# Patient Record
Sex: Male | Born: 1970 | Race: White | Hispanic: No | Marital: Married | Smoking: Never smoker
Health system: Southern US, Community
[De-identification: ages and names within clinical notes are randomized; demographics above are authoritative.]

## PROBLEM LIST (undated history)

## (undated) DIAGNOSIS — E785 Hyperlipidemia, unspecified: Secondary | ICD-10-CM

## (undated) DIAGNOSIS — Z8774 Personal history of (corrected) congenital malformations of heart and circulatory system: Secondary | ICD-10-CM

## (undated) DIAGNOSIS — K219 Gastro-esophageal reflux disease without esophagitis: Secondary | ICD-10-CM

## (undated) DIAGNOSIS — C73 Malignant neoplasm of thyroid gland: Secondary | ICD-10-CM

## (undated) DIAGNOSIS — Z8709 Personal history of other diseases of the respiratory system: Secondary | ICD-10-CM

## (undated) DIAGNOSIS — M169 Osteoarthritis of hip, unspecified: Secondary | ICD-10-CM

## (undated) DIAGNOSIS — G459 Transient cerebral ischemic attack, unspecified: Secondary | ICD-10-CM

## (undated) DIAGNOSIS — Z7901 Long term (current) use of anticoagulants: Secondary | ICD-10-CM

## (undated) DIAGNOSIS — I712 Thoracic aortic aneurysm, without rupture: Secondary | ICD-10-CM

## (undated) DIAGNOSIS — R42 Dizziness and giddiness: Secondary | ICD-10-CM

## (undated) DIAGNOSIS — I350 Nonrheumatic aortic (valve) stenosis: Secondary | ICD-10-CM

## (undated) DIAGNOSIS — Z9109 Other allergy status, other than to drugs and biological substances: Secondary | ICD-10-CM

## (undated) DIAGNOSIS — R911 Solitary pulmonary nodule: Secondary | ICD-10-CM

## (undated) DIAGNOSIS — E039 Hypothyroidism, unspecified: Secondary | ICD-10-CM

## (undated) DIAGNOSIS — E890 Postprocedural hypothyroidism: Secondary | ICD-10-CM

## (undated) DIAGNOSIS — Z9089 Acquired absence of other organs: Secondary | ICD-10-CM

## (undated) DIAGNOSIS — Z952 Presence of prosthetic heart valve: Secondary | ICD-10-CM

## (undated) DIAGNOSIS — T8859XA Other complications of anesthesia, initial encounter: Secondary | ICD-10-CM

## (undated) DIAGNOSIS — R112 Nausea with vomiting, unspecified: Secondary | ICD-10-CM

## (undated) DIAGNOSIS — C801 Malignant (primary) neoplasm, unspecified: Secondary | ICD-10-CM

## (undated) DIAGNOSIS — I10 Essential (primary) hypertension: Secondary | ICD-10-CM

## (undated) DIAGNOSIS — Z9889 Other specified postprocedural states: Secondary | ICD-10-CM

## (undated) DIAGNOSIS — T4145XA Adverse effect of unspecified anesthetic, initial encounter: Secondary | ICD-10-CM

## (undated) HISTORY — DX: Osteoarthritis of hip, unspecified: M16.9

## (undated) HISTORY — PX: CARDIAC VALVE SURGERY: SHX40

## (undated) HISTORY — DX: Essential (primary) hypertension: I10

## (undated) HISTORY — DX: Presence of prosthetic heart valve: Z95.2

## (undated) HISTORY — PX: THYROIDECTOMY: SHX17

## (undated) HISTORY — DX: Personal history of (corrected) congenital malformations of heart and circulatory system: Z87.74

## (undated) HISTORY — DX: Thoracic aortic aneurysm, without rupture: I71.2

## (undated) HISTORY — DX: Long term (current) use of anticoagulants: Z79.01

## (undated) HISTORY — DX: Malignant (primary) neoplasm, unspecified: C80.1

## (undated) HISTORY — DX: Solitary pulmonary nodule: R91.1

## (undated) HISTORY — DX: Malignant neoplasm of thyroid gland: C73

## (undated) HISTORY — DX: Nonrheumatic aortic (valve) stenosis: I35.0

## (undated) HISTORY — DX: Hyperlipidemia, unspecified: E78.5

---

## 2011-03-17 DIAGNOSIS — M169 Osteoarthritis of hip, unspecified: Secondary | ICD-10-CM

## 2011-03-17 HISTORY — DX: Osteoarthritis of hip, unspecified: M16.9

## 2014-12-03 DIAGNOSIS — Z952 Presence of prosthetic heart valve: Secondary | ICD-10-CM | POA: Insufficient documentation

## 2014-12-03 DIAGNOSIS — Z7901 Long term (current) use of anticoagulants: Secondary | ICD-10-CM | POA: Insufficient documentation

## 2014-12-03 DIAGNOSIS — I350 Nonrheumatic aortic (valve) stenosis: Secondary | ICD-10-CM | POA: Insufficient documentation

## 2014-12-03 HISTORY — DX: Presence of prosthetic heart valve: Z95.2

## 2014-12-03 HISTORY — DX: Long term (current) use of anticoagulants: Z79.01

## 2014-12-19 DIAGNOSIS — R1310 Dysphagia, unspecified: Secondary | ICD-10-CM | POA: Insufficient documentation

## 2014-12-19 DIAGNOSIS — E079 Disorder of thyroid, unspecified: Secondary | ICD-10-CM | POA: Insufficient documentation

## 2014-12-19 DIAGNOSIS — I10 Essential (primary) hypertension: Secondary | ICD-10-CM | POA: Insufficient documentation

## 2014-12-19 HISTORY — DX: Essential (primary) hypertension: I10

## 2014-12-25 DIAGNOSIS — C73 Malignant neoplasm of thyroid gland: Secondary | ICD-10-CM | POA: Insufficient documentation

## 2014-12-25 HISTORY — DX: Malignant neoplasm of thyroid gland: C73

## 2015-01-03 DIAGNOSIS — E785 Hyperlipidemia, unspecified: Secondary | ICD-10-CM | POA: Insufficient documentation

## 2015-01-29 ENCOUNTER — Other Ambulatory Visit (HOSPITAL_COMMUNITY): Payer: Self-pay | Admitting: Endocrinology

## 2015-01-29 DIAGNOSIS — C73 Malignant neoplasm of thyroid gland: Secondary | ICD-10-CM

## 2015-02-11 ENCOUNTER — Ambulatory Visit (HOSPITAL_COMMUNITY)
Admission: RE | Admit: 2015-02-11 | Discharge: 2015-02-11 | Disposition: A | Discharge status: Home / Self Care | Payer: PRIVATE HEALTH INSURANCE | Source: Ambulatory Visit | Attending: Endocrinology | Admitting: Endocrinology

## 2015-02-11 DIAGNOSIS — C73 Malignant neoplasm of thyroid gland: Secondary | ICD-10-CM | POA: Diagnosis not present

## 2015-02-11 MED ORDER — THYROTROPIN ALFA 1.1 MG IM SOLR
0.9000 mg | INTRAMUSCULAR | Status: AC
  Administered 2015-02-11: 0.9 mg via INTRAMUSCULAR

## 2015-02-12 ENCOUNTER — Encounter (HOSPITAL_COMMUNITY)
Admission: RE | Admit: 2015-02-12 | Discharge: 2015-02-12 | Disposition: A | Discharge status: Home / Self Care | Payer: PRIVATE HEALTH INSURANCE | Source: Ambulatory Visit | Attending: Endocrinology | Admitting: Endocrinology

## 2015-02-12 DIAGNOSIS — C73 Malignant neoplasm of thyroid gland: Secondary | ICD-10-CM | POA: Diagnosis not present

## 2015-02-12 MED ORDER — THYROTROPIN ALFA 1.1 MG IM SOLR
0.9000 mg | INTRAMUSCULAR | Status: AC
  Administered 2015-02-12: 0.9 mg via INTRAMUSCULAR

## 2015-02-12 MED ORDER — STERILE WATER FOR INJECTION IJ SOLN
INTRAMUSCULAR | Status: AC
  Filled 2015-02-12: qty 10

## 2015-02-13 ENCOUNTER — Encounter (HOSPITAL_COMMUNITY)
Admission: RE | Admit: 2015-02-13 | Discharge: 2015-02-13 | Disposition: A | Discharge status: Home / Self Care | Payer: PRIVATE HEALTH INSURANCE | Source: Ambulatory Visit | Attending: Endocrinology | Admitting: Endocrinology

## 2015-02-13 DIAGNOSIS — C73 Malignant neoplasm of thyroid gland: Secondary | ICD-10-CM | POA: Diagnosis not present

## 2015-02-13 MED ORDER — SODIUM IODIDE I 131 CAPSULE
132.7000 | Freq: Once | INTRAVENOUS | Status: AC | PRN
  Administered 2015-02-13: 132.7 via ORAL

## 2015-02-22 ENCOUNTER — Ambulatory Visit (HOSPITAL_COMMUNITY)
Admission: RE | Admit: 2015-02-22 | Discharge: 2015-02-22 | Disposition: A | Discharge status: Home / Self Care | Payer: PRIVATE HEALTH INSURANCE | Source: Ambulatory Visit | Attending: Endocrinology | Admitting: Endocrinology

## 2015-02-22 DIAGNOSIS — Z9889 Other specified postprocedural states: Secondary | ICD-10-CM | POA: Insufficient documentation

## 2015-02-22 DIAGNOSIS — C73 Malignant neoplasm of thyroid gland: Secondary | ICD-10-CM | POA: Insufficient documentation

## 2015-04-18 ENCOUNTER — Inpatient Hospital Stay (HOSPITAL_COMMUNITY)
Admission: RE | Admit: 2015-04-18 | Discharge: 2015-04-18 | Disposition: A | Discharge status: Home / Self Care | Payer: PRIVATE HEALTH INSURANCE | Source: Ambulatory Visit

## 2015-04-25 NOTE — Progress Notes (Signed)
Consulted with Anesthesia, Dr. Landry Dyke and Dr. 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 Has Cardiology clearance from [facility] ( [facility] of [facility]) from Benito Mccreedy, NP on 04/10/2015.  Per Dr. Landry Dyke and Dr. Jillyn Hidden, patient needs Vascular clearance for surgery! 04/10/2015-Office note from Benito Mccreedy, NP on chart and EKG also. 04/18/2015-Stress Echo from Cardiology on chart.  01/09/2015-TEE from Cardiology on chart. Benard Rink, RN left message on VM for Judeen Hammans at Dr. Trevor Mace office informing her of Anesthesia request.

## 2015-04-29 DIAGNOSIS — Z5181 Encounter for therapeutic drug level monitoring: Secondary | ICD-10-CM | POA: Insufficient documentation

## 2015-04-30 ENCOUNTER — Other Ambulatory Visit (HOSPITAL_COMMUNITY): Payer: Self-pay | Admitting: Orthopaedic Surgery

## 2015-05-01 ENCOUNTER — Institutional Professional Consult (permissible substitution) (INDEPENDENT_AMBULATORY_CARE_PROVIDER_SITE_OTHER): Payer: PRIVATE HEALTH INSURANCE | Admitting: Cardiothoracic Surgery

## 2015-05-01 ENCOUNTER — Encounter: Payer: Self-pay | Admitting: Cardiothoracic Surgery

## 2015-05-01 ENCOUNTER — Ambulatory Visit (INDEPENDENT_AMBULATORY_CARE_PROVIDER_SITE_OTHER): Payer: PRIVATE HEALTH INSURANCE | Admitting: Vascular Surgery

## 2015-05-01 ENCOUNTER — Encounter: Payer: Self-pay | Admitting: Vascular Surgery

## 2015-05-01 VITALS — BP 140/107 | HR 76 | Ht 70.0 in | Wt 206.0 lb

## 2015-05-01 VITALS — BP 136/102 | HR 74 | Resp 20 | Ht 70.0 in | Wt 206.0 lb

## 2015-05-01 DIAGNOSIS — Z954 Presence of other heart-valve replacement: Secondary | ICD-10-CM | POA: Diagnosis not present

## 2015-05-01 DIAGNOSIS — Z8774 Personal history of (corrected) congenital malformations of heart and circulatory system: Secondary | ICD-10-CM

## 2015-05-01 DIAGNOSIS — Z952 Presence of prosthetic heart valve: Secondary | ICD-10-CM

## 2015-05-01 DIAGNOSIS — C189 Malignant neoplasm of colon, unspecified: Secondary | ICD-10-CM

## 2015-05-01 DIAGNOSIS — I7121 Aneurysm of the ascending aorta, without rupture: Secondary | ICD-10-CM

## 2015-05-01 DIAGNOSIS — Z01818 Encounter for other preprocedural examination: Secondary | ICD-10-CM

## 2015-05-01 DIAGNOSIS — I712 Thoracic aortic aneurysm, without rupture, unspecified: Secondary | ICD-10-CM

## 2015-05-01 DIAGNOSIS — Z7901 Long term (current) use of anticoagulants: Secondary | ICD-10-CM | POA: Diagnosis not present

## 2015-05-01 DIAGNOSIS — R911 Solitary pulmonary nodule: Secondary | ICD-10-CM

## 2015-05-01 HISTORY — DX: Solitary pulmonary nodule: R91.1

## 2015-05-01 HISTORY — DX: Thoracic aortic aneurysm, without rupture, unspecified: I71.20

## 2015-05-01 HISTORY — DX: Thoracic aortic aneurysm, without rupture: I71.2

## 2015-05-01 HISTORY — DX: Personal history of (corrected) congenital malformations of heart and circulatory system: Z87.74

## 2015-05-01 NOTE — Patient Instructions (Addendum)
YOUR PROCEDURE IS SCHEDULED ON :  05/10/15  REPORT TO [facility] MAIN ENTRANCE FOLLOW SIGNS TO EAST ELEVATOR - GO TO 3rd FLOOR CHECK IN AT 3 EAST NURSES STATION (SHORT STAY) AT:  12:45 PM  CALL THIS NUMBER IF YOU HAVE PROBLEMS THE MORNING OF SURGERY [phone]  REMEMBER:ONLY 1 PER PERSON MAY GO TO SHORT STAY WITH YOU TO GET READY THE MORNING OF YOUR SURGERY  DO NOT EAT FOOD  AFTER MIDNIGHT  MAY HAVE CLEAR LIQUIDS UNTIL 8:45 AM  TAKE THESE MEDICINES THE MORNING OF SURGERY:  SYNTHROID  CLEAR LIQUID DIET  Foods Allowed                                                                     Foods Excluded  Coffee and tea, regular and decaf                             liquids that you cannot  Plain Jell-O in any flavor                                             see through such as: Fruit ices (not with fruit pulp)                                     milk, soups, orange juice  Iced Popsicles                                                             All solid food Carbonated beverages, regular and diet                                    Cranberry, grape and apple juices Sports drinks like Gatorade Lightly seasoned clear broth or consume(fat free) Sugar, honey syrup fee or tea  _____________________________________________________________________    YOU MAY NOT HAVE ANY METAL ON YOUR BODY INCLUDING HAIR PINS AND PIERCING'S. DO NOT WEAR JEWELRY, MAKEUP, LOTIONS, POWDERS OR PERFUMES. DO NOT WEAR NAIL POLISH. DO NOT SHAVE 48 HRS PRIOR TO SURGERY. MEN MAY SHAVE FACE AND NECK.  DO NOT [facility] IS NOT RESPONSIBLE FOR VALUABLES.  CONTACTS, DENTURES OR PARTIALS MAY NOT BE WORN TO SURGERY. LEAVE SUITCASE IN CAR. CAN BE BROUGHT TO ROOM AFTER SURGERY.  PATIENTS DISCHARGED THE DAY OF SURGERY WILL NOT BE ALLOWED TO DRIVE HOME.  PLEASE READ OVER THE FOLLOWING INSTRUCTION  SHEETS _________________________________________________________________________________                                          [name] [name]  Before surgery, you can play an important role.  Because skin is not sterile, your skin needs to be as free of germs as possible.  You can reduce the number of germs on your skin by washing with CHG (chlorahexidine gluconate) soap before surgery.  CHG is an antiseptic cleaner which kills germs and bonds with the skin to continue killing germs even after washing. Please DO NOT use if you have an allergy to CHG or antibacterial soaps.  If your skin becomes reddened/irritated stop using the CHG and inform your nurse when you arrive at Short Stay. Do not shave (including legs and underarms) for at least 48 hours prior to the first CHG shower.  You may shave your face. Please follow these instructions carefully:   1.  Shower with CHG Soap the night before surgery and the  morning of Surgery.   2.  If you choose to wash your hair, wash your hair first as usual with your  normal  Shampoo.   3.  After you shampoo, rinse your hair and body thoroughly to remove the  shampoo.                                         4.  Use CHG as you would any other liquid soap.  You can apply chg directly  to the skin and wash . Gently wash with scrungie or clean wascloth    5.  Apply the CHG Soap to your body ONLY FROM THE NECK DOWN.   Do not use on open                           Wound or open sores. Avoid contact with eyes, ears mouth and genitals (private parts).                        Genitals (private parts) with your normal soap.              6.  Wash thoroughly, paying special attention to the area where your surgery  will be performed.   7.  Thoroughly rinse your body with warm water from the neck down.   8.  DO NOT shower/wash with your normal soap after using and rinsing off  the CHG Soap .                9.  Pat yourself dry with a clean  towel.             10.  Wear clean night clothes to bed after shower             11.  Place clean sheets on your bed the night of your first shower and do not  sleep with pets.  Day of Surgery : Do not apply any lotions/deodorants the morning of surgery.  Please wear clean clothes to the hospital/surgery center.  FAILURE TO FOLLOW THESE INSTRUCTIONS MAY RESULT IN THE CANCELLATION OF YOUR SURGERY    PATIENT SIGNATURE_________________________________  ______________________________________________________________________

## 2015-05-01 NOTE — Progress Notes (Signed)
[address]       [name],[name] [address]             [phone]                    [name] [name] Medical Record [id number] Date of Birth: [birth year]  Referring: Angelia [name] Primary Care: No primary care provider on file. Cardiology: Dr. Jason [name] Endocrinology: [name] Planas MD  Chief Complaint:    Chief Complaint  Patient presents with  . Thoracic Aortic Aneurysm    Surgical eval, Chest CT 03/14/15     History of Present Illness:    [name] [name] 44 y.o. male is seen in the office  because of the incidental discovery of a dilated descending aorta. The patient has had several years of hip pain and is planning on elective hip replacement next week. His cardiac history starts at age 4 when he had critical aortic stenosis and underwent open aortic valve commissurotomy in [place]. Subsequently he presented in September 2003 with critical aortic stenosis aortic insufficiency and underwent aortic valve replacement with a #23 [employer]. Jude mechanical valve model #23 AGN- 751, serial [id number]. At the time of surgery [name] [name] in [facility] described also a repair of the pseudoaneurysm of the ascending aorta. 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 The remainder of the descending aorta was not noted to be enlarged. The patient has been clinically stable since that time on Coumadin, with the exception of a brief period early postop when he stopped taking his Coumadin and had a minor stroke. 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 In April 02, 2015 CT scan of the chest was performed  demonstrating a 5.5 cm dilatation of the ascending aorta. In addition there was a 4 mm left lower lobe lung nodule, too small to characterize.   The patient was sent to the vascular surgery office by anesthesia for evaluation prior to elective hip surgery, Dr. Scot [name] relies that the issue was in the ascending aorta and  referred him to the cardiac surgery office. The patient has been seen by cardiology, Dr. Elonda [name] patient reports that a stress echocardiogram was performed.  The patient has no family history of aortic disease, aortic dissection for aortic aneurysm. His father died in 04/02/23 of this year at age 57 with on dialysis and with congestive heart failure, his mother is alive at age 43 with congestive heart failure and COPD she did have a myocardial infarction at age 59. He has one brother with no medical history. He denies any history of premature cath or history of aortic aneurysms or dissections in any uncles her cousins.  Current Activity/ Functional Status:  Patient is independent with mobility/ambulation, transfers, ADL's, IADL's.   Zubrod Score: At the time of surgery this patient's most appropriate activity status/level should be described as: [x]     0    Normal activity, no symptoms []     1    Restricted in physical strenuous activity but ambulatory, able to do out light work []     2    Ambulatory  and capable of self care, unable to do work activities, up and about               >50 % of waking hours                              []     3    Only limited self care, in bed greater than 50% of waking hours []     4    Completely disabled, no self care, confined to bed or chair []     5    Moribund   Past Medical History  Diagnosis Date  . Cancer ([place])     thyroid  . Stroke ([place])   . Aortic stenosis   . Long term current use of anticoagulant 12/03/2014  . H/O aortic valve replacement 12/03/2014  . HLD (hyperlipidemia) 01/03/2015  . BP (high blood pressure) 12/19/2014  .  Degenerative arthritis of hip 03/17/2011  . 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 Thoracic aortic aneurysm, without rupture ([place]) 05/01/2015  . Incidental pulmonary nodule, > 80mm and < 79mm left lower lobe 05/01/2015  . H/O bicuspid aortic valve 05/01/2015    Past Surgical History  Procedure Laterality Date  . Cardiac valve surgery      AVReplacement 2003. Dr Berline [name] in [facility]  . Thyroidectomy      12/24/2014, Dr Meredith [name] [facility]    Family History  Problem Relation Age of Onset  . Heart disease Mother     before age 67  . Heart disease Father     before age 30    Social History   Social History  . Marital Status: Single    Spouse Name: N/A  . Number of Children: N/A  . Years of Education: N/A   Occupational History  . Not on file.   Social History Main Topics  . Smoking status: Never Smoker   . Smokeless tobacco: Former Systems developer    Types: Snuff  . Alcohol Use: No  . Drug Use: No  . Sexual Activity: Not on file   Other Topics Concern  . Not on file   Social History Narrative    History  Smoking status  . Never Smoker   Smokeless tobacco  . Former Systems developer  . Types: Snuff    History  Alcohol Use No     No Known Allergies  Current Outpatient Prescriptions  Medication Sig Dispense Refill  . albuterol (PROVENTIL HFA;VENTOLIN HFA) 108 (90 BASE) MCG/ACT inhaler Inhale 2 puffs into the lungs every 6 (six) hours as needed.    Marland Kitchen levothyroxine (SYNTHROID) 100 MCG tablet Take 100 mcg by mouth daily before breakfast.     . lisinopril (PRINIVIL,ZESTRIL) 5 MG tablet Take 5 mg by mouth daily.    . ranitidine (ZANTAC) 150 MG tablet Take 150 mg by mouth.    . warfarin (COUMADIN) 1 MG tablet Take 1 mg by mouth as directed. Take in addition to 10 mg tab on Tues & Thurs for total dose 11 mg    . warfarin (COUMADIN) 10 MG tablet Take 10 mg by mouth daily. Takes an additional 1 mg on Tues & Thurs for 11 mg total     No current facility-administered medications for  this visit.       Review of Systems:     Cardiac Review of Systems: Y or N  Chest Pain [ N   ]  Resting  SOB [ N  ] Exertional SOB  [ N ]  Orthopnea Aqua.Slicker  ]   Pedal Edema [  N ]    Palpitations [ N ] Syncope  [  N]   Presyncope Aqua.Slicker   ]  General Review of Systems: [Y] = yes [  ]=no Constitional: recent weight change Aqua.Slicker  ];  Wt loss over the last 3 months [   ] anorexia [ n ]; fatigue Aqua.Slicker  ]; nausea [  ]; night sweats [  ]; fever [ N ]; or chills Aqua.Slicker  ];          Dental: poor dentition[ N ]; Last Dentist visit:   Eye : blurred vision [  ]; diplopia [   ]; vision changes [  ];  Amaurosis fugax[  ]; Resp: cough [  ];  wheezing[  ];  hemoptysis[  ]; shortness of breath[  ]; paroxysmal nocturnal dyspnea[  ]; dyspnea on exertion[  ]; or orthopnea[  ];  GI:  gallstones[  ], vomiting[  ];  dysphagia[  ]; melena[  ];  hematochezia [  ]; heartburn[  ];   Hx of  Colonoscopy[  ]; GU: kidney stones [  ]; hematuria[  ];   dysuria [  ];  nocturia[  ];  history of     obstruction [  ]; urinary frequency [  ]             Skin: rash, swelling[  ];, hair loss[  ];  peripheral edema[  ];  or itching[  ]; Musculosketetal: myalgias[  ];  joint swelling[  ];  joint erythema[  ];  joint pain[Y  ];  back pain[  ];  Heme/Lymph: bruising[  ];  bleeding[  ];  anemia[  ];  Neuro: TIA[ Y ];  headaches[  ];  stroke[  ];  vertigo[  ];  seizures[  ];   paresthesias[  ];  difficulty walking[  ];  Psych:depression[  ]; anxiety[  ];  Endocrine: diabetes[N  ];  thyroid dysfunction[N  ];  Immunizations: Flu up to date Jazmín.Cullens  ]; Pneumococcal up to date Aqua.Slicker  ];  Other:  Physical Exam: BP 136/102 mmHg  Pulse 74  Resp 20  Ht 5\' 10"  (1.778 m)  Wt 206 lb (93.441 kg)  BMI 29.56 kg/m2  SpO2 96%  PHYSICAL EXAMINATION: General appearance: alert, cooperative, appears stated age and no distress Head: Normocephalic, without obvious abnormality, atraumatic Neck: no adenopathy, no carotid bruit, no JVD, supple, symmetrical, trachea midline  and PATIENT'S MIDLINE CERVICAL INCISION FROM HIS THYROIDECTOMIES HEALING WELL SOME SWELLING OF THE NECK GREATER ON THE RIGHT THAN THE LEFT Lymph nodes: Cervical, supraclavicular, and axillary nodes normal. Resp: clear to auscultation bilaterally Back: symmetric, no curvature. ROM normal. No CVA tenderness. Cardio: regular rate and rhythm, S1, S2 normal, no murmur, click, rub or gallop and VALVE SOUNDS/VALVE CLICK IS CLEAR AND CRISP, NO MURMUR OF AORTIC INSUFFICIENCY GI: soft, non-tender; bowel sounds normal; no masses,  no organomegaly Extremities: extremities normal, atraumatic, no cyanosis or edema and Homans sign is negative, no sign of DVT Neurologic: Grossly normal  Diagnostic Studies & Laboratory data:     Recent Radiology Findings:   CT scan is in a PACS system: No findings specific for metastatic disease in the chest, 4 x 3 mm left lower lobe pulmonary nodule nonspecific, no correlating image finding in the right hemothorax noted on the I-131 scan there is evidence of aortic valve replacement associated  5.5 cm ascending thoracic aortic aneurysm     I have independently reviewed the above radiology studies  and reviewed the findings with the patient.   Recent Lab Findings: No results found for: WBC, HGB, HCT, PLT, GLUCOSE, CHOL, TRIG, HDL, LDLDIRECT, LDLCALC, ALT, AST, NA, K, CL, CREATININE, BUN, CO2, TSH, INR, GLUF, HGBA1C  Aortic Size Index=     5.5    /Body surface area is 2.15 meters squared. = 2.55  < 2.75 cm/m2      4% risk per year 2.75 to 4.25          8% risk per year > 4.25 cm/m2    20% risk per year    Assessment / Plan:    Mid ascending aorta dilated to 5.5 cm in patient with previous history of bicuspid aortic valve and stenosis replaced with a mechanical valve and repair of pseudo-aneurysm of previous aortotomy- 2003 adequate anticoagulation on Coumadin chronically because of mechanical aortic valve prosthesis I have reviewed with the patient and his wife in  depth the significance of the finding of dilated ascending aorta to 5.5 cm. He is aware that he is at increased risk of aortic rupture or dissection because of this dilatation. I reviewed with him that at 5.5 cm in an otherwise suitable risk patient for surgery the recommendation would be to proceed with elective replacement of the ascending aorta and possible root replacement. His questions have been answered in detail and he wishes to proceed with his hip replacement.  I recommend that to him that the very minimal would be a repeat CT scan in 5 months, 6 months from the previous scan. Consideration for proceeding with cardiac catheterization and elective aortic repair within the next 6 months was discussed with him, he was reviewing his options Have made a follow-up appointment to see me in 5-6 weeks, after his hip replacement to further discuss timing of repair. I reviewed with him in detail the signs and symptoms of aortic dissection and the need for immediate care should he have any symptoms.    I've cautioned him about doing any heavy physical work specifically heavy lifting or straining From a medical standpoint we could consider starting beta blocker He will need follow-up CT scan in the future for the left lower lobe 4 mm pulmonary nodule in addition According to the 2010 ACC/AHA guidelines, we recommend patients with thoracic aortic disease to maintain a LDL of less than 70 and a HDL of greater than 50. We recommend their blood pressure to remain less than 135/85. The patient was educated to take lifelong prophylactic antibiotics prior to any elective invasive procedure.   I  spent 80 minutes counseling the patient face to face and 50% or more the  time was spent in counseling and coordination of care. The total time spent in the appointment was 110 minutes.  [name] MD      [address] [place] [address] Office [phone]   Beeper  [phone]  05/01/2015 1:53 PM

## 2015-05-01 NOTE — Progress Notes (Signed)
[facility]  Patient name: [name] [name] MRN: [id number] DOB: [birth year] Sex: male  REASON FOR CONSULT: Preoperative clearance prior to left total hip replacement on 05/10/2015.  HPI: [name] [name] is a 44 y.o. male, who was referred for preoperative clearance. However, I think that he was supposed to be sent to see the cardiac surgeons and not the vascular surgeons. He had a CT scan done on 04/10/2015 which showed that he had a 5.5 cm ascending thoracic aortic aneurysm. According to the patient, he was seen by his cardiologist in [facility] and it was felt that this was stable and not a contraindication to his upcoming left total hip replacement. However he was then seen by anesthesia who wanted preoperative clearance for this and he was set up for an appointment to see me.  From a vascular standpoint, he denies any history of claudication, rest pain, or nonhealing ulcers.  His risk factors for vascular disease include hypertension and hypercholesterolemia. He denies any family history of premature cardiovascular disease or history of tobacco use.  He is on Coumadin as he has undergone a previous aortic valve replacement with a mechanical valve in 2003 and [place].  Past Medical History  Diagnosis Date  . Cancer ([place])     thyroid  . Stroke ([place])   . Aortic stenosis     Family History  Problem Relation Age of Onset  . Heart disease Mother     before age 13  . Heart disease Father     before age 69    SOCIAL HISTORY: Social History   Social History  . Marital Status: Single    Spouse Name: N/A  . Number of Children: N/A  . Years of Education: N/A   Occupational History  . Not on file.   Social History Main Topics  . Smoking status: Never Smoker   . Smokeless tobacco: Former Systems developer    Types: Snuff  . Alcohol Use: No  . Drug Use: No  . Sexual Activity: Not on file   Other Topics Concern  . Not on file   Social History Narrative  .  No narrative on file    No Known Allergies  Current Outpatient Prescriptions  Medication Sig Dispense Refill  . albuterol (PROVENTIL HFA;VENTOLIN HFA) 108 (90 BASE) MCG/ACT inhaler Inhale 2 puffs into the lungs every 6 (six) hours as needed.    Marland Kitchen levothyroxine (SYNTHROID) 100 MCG tablet Take 100 mcg by mouth daily before breakfast.     . lisinopril (PRINIVIL,ZESTRIL) 5 MG tablet Take 5 mg by mouth daily.    . ranitidine (ZANTAC) 150 MG tablet Take 150 mg by mouth.    . warfarin (COUMADIN) 1 MG tablet Take 1 mg by mouth as directed. Take in addition to 10 mg tab on Tues & Thurs for total dose 11 mg    . warfarin (COUMADIN) 10 MG tablet Take 10 mg by mouth daily. Takes an additional 1 mg on Tues & Thurs for 11 mg total     No current facility-administered medications for this visit.    REVIEW OF SYSTEMS:  [X]  denotes positive finding, [ ]  denotes negative finding Cardiac  Comments:  Chest pain or chest pressure:    Shortness of breath upon exertion:    Short of breath when lying flat:    Irregular heart rhythm: X       Vascular    Pain in calf, thigh, or hip brought on by ambulation:  Pain in feet at night that wakes you up from your sleep:     Blood clot in your veins:    Leg swelling:         Pulmonary    Oxygen at home:    Productive cough:     Wheezing:         Neurologic    Sudden weakness in arms or legs:     Sudden numbness in arms or legs:     Sudden onset of difficulty speaking or slurred speech:    Temporary loss of vision in one eye:     Problems with dizziness:         Gastrointestinal    Blood in stool:     Vomited blood:         Genitourinary    Burning when urinating:     Blood in urine:        Psychiatric    Major depression:         Hematologic    Bleeding problems:    Problems with blood clotting too easily:        Skin    Rashes or ulcers:        Constitutional    Fever or chills:      PHYSICAL EXAM: Filed Vitals:   05/01/15 0828  05/01/15 0833  BP: 144/110 140/107  Pulse: 76   Height: 5\' 10"  (1.778 m)   Weight: 206 lb (93.441 kg)   SpO2: 100%     GENERAL: The patient is a well-nourished male, in no acute distress. The vital signs are documented above. CARDIAC: There is a regular rate and rhythm.  VASCULAR: I do not detect carotid bruits. He has palpable femoral, popliteal, dorsalis pedis, and posterior tibial pulses. He has no significant lower extremity swelling. PULMONARY: There is good air exchange bilaterally without wheezing or rales. ABDOMEN: Soft and non-tender with normal pitched bowel sounds.  MUSCULOSKELETAL: There are no major deformities or cyanosis. NEUROLOGIC: No focal weakness or paresthesias are detected. SKIN: There are no ulcers or rashes noted. PSYCHIATRIC: The patient has a normal affect.  DATA:  His CT scan that was done in [facility] shows a 5.5 cm ascending thoracic aortic aneurysm. He is status post aortic valve replacement. There is also a 4 mm x 3 mm left lower lobe pulmonary nodule.  MEDICAL ISSUES:  PREOPERATIVE CLEARANCE: Certainly from a vascular standpoint he is in excellent shape for his upcoming hip replacement. However I'm making arrangements for him to be seen by [facility] surgeons for preoperative clearance concerning his 5.5 cm ascending thoracic aortic aneurysm. I have spoken to [facility] and they are going to fit him in to the schedule today.  HYPERTENSION: The patient's initial blood pressure today was elevated. We repeated this and this was still elevated. We have encouraged the patient to follow up with their primary care physician for management of their blood pressure.   Deitra Mayo [facility] [place]: [phone]

## 2015-05-01 NOTE — Patient Instructions (Addendum)
Thoracic Aortic Aneurysm An aneurysm is a bulge in an artery. It happens when the wall of the artery is weakened or damaged. If the aneurysm gets too big, it bursts (ruptures) and severe bleeding occurs. A thoracic aortic aneurysm is an aneurysm that occurs in the first part of the aorta, between the heart and the diaphragm. The aorta is the main artery and supplies blood from the heart to the rest of the body. A thoracic aortic aneurysm can enlarge and rupture or blood can flow between the layers of the wall of the aorta through a tear (aorticdissection). Both of these conditions can cause bleeding inside the body and can be life threatening unless diagnosed and treated promptly. CAUSES  The exact cause of a thoracic aortic aneurysm is often unknown. Some contributing factors are:   A hardening of the arteries caused by the buildup of fat and other substances in the lining of a blood vessel (arteriosclerosis).  Inflammation of the walls of an artery (arteritis).  Connective tissue diseases, such as Marfan syndrome.  Injury or trauma to the aorta.  An infection, such as syphilis or staphylococcus, in the wall of the aorta (infectious aortitis) caused by bacteria. RISK FACTORS  Risk factors that contribute to a thoracic aortic aneurysm may include:  Age older than 19 years.  High blood pressure (hypertension).  Male gender.  Ethnicity (white race).  Obesity.  Family history of aneurysm (first degree relatives only).  Tobacco use. PREVENTION  The following healthy lifestyle habits may help decrease your risk of a thoracic aortic aneurysm:  Quitting smoking. Smoking can raise your blood pressure and cause arteriosclerosis.  Limiting or avoiding alcohol.  Keeping your blood pressure, blood sugar level, and cholesterol levels within normal limits.  Decreasing your salt intake. In some people, too much salt can raise blood pressure and increase your risk of abdominal aortic  aneurysm.  Eating a diet low in saturated fats and cholesterol.  Increasing your fiber intake by including whole grains, vegetables, and fruits in your diet. Eating these foods may help lower blood pressure.  Maintaining a healthy weight.  Staying physically active and exercising regularly. SYMPTOMS  The symptoms of thoracic aortic aneurysm may vary depending on the size and rate of growth of the aneurysm. Most grow slowly and do not have any symptoms. When symptoms do occur, they may include:  Pain (chest, back, sides, or abdomen). The pain may vary in intensity. A sudden onset of severe pain may indicate that the aneurysm has ruptured.  Hoarseness.  Cough.  Shortness of breath.  Swallowing problems.  Nausea or vomiting or both. DIAGNOSIS  Since most unruptured thoracic aortic aneurysms have no symptoms, they are often discovered during diagnostic exams for other conditions. An aneurysm may be found during the following procedures:  Ultrasonography (a one-time screening for thoracic aortic aneurysm by ultrasonography is also recommended for all men aged 63-75 years who have ever smoked).  X-ray exams.  A CT scan.  An MRI.  Angiography or arteriography. TREATMENT  Treatment of a thoracic aortic aneurysm depends on the size of your aneurysm, your age, and risk factors for rupture. Medicine to control blood pressure and pain may be used to manage aneurysms smaller than 2.3 in (6 cm). Regular monitoring for enlargement may be recommended by your health care provider if:  The aneurysm is 1.2-1.5 in (3-4 cm) in size (an annual ultrasonography may be recommended).  The aneurysm is 1.5-1.8 in (4-4.5 cm) in size (an ultrasonography every 6  months may be recommended).  The aneurysm is larger than 1.8 in (4.5 cm) in size (your health care provider may ask that you be examined by a vascular surgeon). If your aneurysm is larger than 2.2 in (5.5 cm) or if it is enlarging quickly,  surgical repair may be recommended. There are two main methods for repair of an aneurysm:   Endovascular repair (a minimally invasive surgery).  Open repair. This method is used if an endovascular repair is not possible.   This information is not intended to replace advice given to you by your health care provider. Make sure you discuss any questions you have with your health care provider.   Document Released: 04/27/2005 Document Revised: 02/15/2013 Document Reviewed: 11/07/2012 Elsevier Interactive Patient Education 2016 [place].     Aortic Dissection An aortic dissection is a tear in your aorta. The aorta is the main blood vessel that carries blood out of your heart to supply the rest of your body. It comes out of your heart and curves around, then goes down through your chest (thoracic aorta) and into your belly (abdominal aorta). The wall of the aorta has inner and outer layers. Aortic dissection occurs most often in the thoracic aorta. This is more likely to happen if the inner layer of the aorta has a weak spot or gets injured. As the dissection widens and blood flows through it, the aorta becomes "double-barreled." This means that one part of the aorta continues to carry blood. However, the inner wall begins to separate from the rest of the aorta as blood flows through the tear. The torn part of the aorta fills with blood. It swells up like a balloon. This can reduce blood flow through the part of the aorta that is still working. Aortic dissection is a medical emergency. CAUSES Aortic dissection happens when there is a tear in the inner wall of the aorta. An injury or weakness can cause this tear. Sometimes the exact cause of the tear is not known. RISK FACTORS You may be at greater risk for aortic dissection if you:  Have certain medical conditions, such as uncontrolled high blood pressure or atherosclerosis.  Have a blunt injury to your chest.  Have a genetic disorder that  affects the connective tissue, such as Marfan syndrome, Turner syndrome, and Ehlers-Danlos syndrome.  Are born with a problem that affects either your aorta or your heart valve.  Have a condition that causes inflammation of blood vessels, such as giant cell arteritis.  Are male.  Are older than 44 years of age.  Use cocaine.  Smoke.  Lift very heavy weights or do other types of high-intensity resistance training. SIGNS AND SYMPTOMS  Signs and symptoms of aortic dissection may start suddenly. Changes in position may make symptoms worse. The most common symptoms are:  Severe chest pain that may feel like a tearing, stabbing, or sharp pain.  Pain that shifts to the shoulder, arm, neck, jaw, abdomen, or hips. Other symptoms may include:  Severe abdominal pain.  Trouble breathing.  Dizziness or fainting.  Nausea or vomiting.  Trouble swallowing.  Sweating a lot.  Feeling confused, dazed, anxious, or fearful. DIAGNOSIS Your health care provider may suspect aortic dissection based on your signs and symptoms and will perform a physical exam. During the physical exam, your health care provider may listen for abnormal blood flow sounds (murmurs) in your chest or your belly. You may also have your blood pressure checked to see whether it is low  or whether there is a difference between the measurements in your arms and your legs. You may also have tests such as:  Electrocardiogram (ECG). This is a test that measures the electrical activity in your heart.  Chest X-ray.  CT scan or MRI.  Aortic angiogram. This test uses the injection of a dye to make it easier to see your blood vessels clearly.  Echocardiogram to study your heart using sound waves. TREATMENT It is important to treat an aortic dissection as quickly as possible. Your treatment may start as soon as your health care provider suspects aortic dissection. Treatment will depend on how severe your dissection is, where it is  located, and your overall health. Treatment options include:  Medicines to lower your blood pressure.  Surgery to remove the dissected part of your aorta and replace it with a graft.  Medical procedures to thread long, thin tubes (catheters) into the aorta (endovascular procedures). This may be done to place a graft or a balloon in the blood vessel to improve blood flow or prevent further dissection. HOME CARE INSTRUCTIONS  Work with your health care provider to keep your blood pressure under control.  Avoid activities that could cause an injury to your chest or your abdomen.  Do not smoke. If you need help quitting, ask your health care provider.  Do not participate in sports or exercises that involve lifting weights.  Keep all follow-up visits as directed by your health care provider. This is important. SEEK MEDICAL CARE IF:  You develop any new symptoms of aortic dissection after treatment. SEEK IMMEDIATE MEDICAL CARE IF:  You have severe pain in your chest or your abdomen.  You have trouble breathing. These symptoms may represent a serious problem that is an emergency. Do not wait to see if the symptoms will go away. Get medical help right away. Call your local emergency services (911 in the [place] Do not drive yourself to the hospital.   This information is not intended to replace advice given to you by your health care provider. Make sure you discuss any questions you have with your health care provider.   Document Released: 08/04/2007 Document Revised: 05/18/2014 Document Reviewed: 12/06/2013 Elsevier Interactive Patient Education [employer].

## 2015-05-02 ENCOUNTER — Encounter (HOSPITAL_COMMUNITY): Payer: Self-pay

## 2015-05-02 ENCOUNTER — Encounter (HOSPITAL_COMMUNITY)
Admission: RE | Admit: 2015-05-02 | Discharge: 2015-05-02 | Disposition: A | Discharge status: Home / Self Care | Payer: PRIVATE HEALTH INSURANCE | Source: Ambulatory Visit | Attending: Orthopaedic Surgery | Admitting: Orthopaedic Surgery

## 2015-05-02 DIAGNOSIS — M1612 Unilateral primary osteoarthritis, left hip: Secondary | ICD-10-CM | POA: Diagnosis not present

## 2015-05-02 DIAGNOSIS — Z01812 Encounter for preprocedural laboratory examination: Secondary | ICD-10-CM | POA: Insufficient documentation

## 2015-05-02 DIAGNOSIS — Z0183 Encounter for blood typing: Secondary | ICD-10-CM | POA: Insufficient documentation

## 2015-05-02 HISTORY — DX: Adverse effect of unspecified anesthetic, initial encounter: T41.45XA

## 2015-05-02 HISTORY — DX: Acquired absence of other organs: Z90.89

## 2015-05-02 HISTORY — DX: Other complications of anesthesia, initial encounter: T88.59XA

## 2015-05-02 HISTORY — DX: Postprocedural hypothyroidism: E89.0

## 2015-05-02 HISTORY — DX: Other specified postprocedural states: Z98.890

## 2015-05-02 HISTORY — DX: Gastro-esophageal reflux disease without esophagitis: K21.9

## 2015-05-02 HISTORY — DX: Nausea with vomiting, unspecified: R11.2

## 2015-05-02 HISTORY — DX: Other allergy status, other than to drugs and biological substances: Z91.09

## 2015-05-02 LAB — BASIC METABOLIC PANEL
Anion gap: 10 (ref 5–15)
BUN: 18 mg/dL (ref 6–20)
CHLORIDE: 103 mmol/L (ref 101–111)
CO2: 31 mmol/L (ref 22–32)
CREATININE: 1.03 mg/dL (ref 0.61–1.24)
Calcium: 8.3 mg/dL — ABNORMAL LOW (ref 8.9–10.3)
GFR calc Af Amer: >60 mL/min (ref >60)
GFR calc non Af Amer: >60 mL/min (ref >60)
Glucose, Bld: 98 mg/dL (ref 65–99)
Potassium: 4.7 mmol/L (ref 3.5–5.1)
SODIUM: 144 mmol/L (ref 135–145)

## 2015-05-02 LAB — CBC
HCT: 44 % (ref 39.0–52.0)
Hemoglobin: 14.3 g/dL (ref 13.0–17.0)
MCH: 30.1 pg (ref 26.0–34.0)
MCHC: 32.5 g/dL (ref 30.0–36.0)
MCV: 92.6 fL (ref 78.0–100.0)
PLATELETS: 280 10*3/uL (ref 150–400)
RBC: 4.75 MIL/uL (ref 4.22–5.81)
RDW: 13.8 % (ref 11.5–15.5)
WBC: 6.5 10*3/uL (ref 4.0–10.5)

## 2015-05-02 LAB — SURGICAL PCR SCREEN
MRSA, PCR: NEGATIVE
Staphylococcus aureus: POSITIVE — AB

## 2015-05-04 LAB — ABO/RH: ABO/RH(D): A NEG

## 2015-05-10 ENCOUNTER — Encounter (HOSPITAL_COMMUNITY)
Admission: RE | Disposition: A | Discharge status: Home Health | Payer: Self-pay | Source: Ambulatory Visit | Attending: Orthopaedic Surgery

## 2015-05-10 ENCOUNTER — Inpatient Hospital Stay (HOSPITAL_COMMUNITY): Payer: PRIVATE HEALTH INSURANCE

## 2015-05-10 ENCOUNTER — Encounter (HOSPITAL_COMMUNITY): Payer: Self-pay | Admitting: *Deleted

## 2015-05-10 ENCOUNTER — Inpatient Hospital Stay (HOSPITAL_COMMUNITY): Payer: PRIVATE HEALTH INSURANCE | Admitting: Anesthesiology

## 2015-05-10 ENCOUNTER — Inpatient Hospital Stay (HOSPITAL_COMMUNITY)
Admission: RE | Admit: 2015-05-10 | Discharge: 2015-05-13 | DRG: 470 | Disposition: A | Discharge status: Home Health | Payer: PRIVATE HEALTH INSURANCE | Source: Ambulatory Visit | Attending: Orthopaedic Surgery | Admitting: Orthopaedic Surgery

## 2015-05-10 DIAGNOSIS — Z7901 Long term (current) use of anticoagulants: Secondary | ICD-10-CM

## 2015-05-10 DIAGNOSIS — Z87891 Personal history of nicotine dependence: Secondary | ICD-10-CM | POA: Diagnosis not present

## 2015-05-10 DIAGNOSIS — I10 Essential (primary) hypertension: Secondary | ICD-10-CM | POA: Diagnosis present

## 2015-05-10 DIAGNOSIS — I739 Peripheral vascular disease, unspecified: Secondary | ICD-10-CM | POA: Diagnosis present

## 2015-05-10 DIAGNOSIS — Z85850 Personal history of malignant neoplasm of thyroid: Secondary | ICD-10-CM

## 2015-05-10 DIAGNOSIS — K219 Gastro-esophageal reflux disease without esophagitis: Secondary | ICD-10-CM | POA: Diagnosis present

## 2015-05-10 DIAGNOSIS — E890 Postprocedural hypothyroidism: Secondary | ICD-10-CM | POA: Diagnosis present

## 2015-05-10 DIAGNOSIS — Z01812 Encounter for preprocedural laboratory examination: Secondary | ICD-10-CM | POA: Diagnosis not present

## 2015-05-10 DIAGNOSIS — Z8673 Personal history of transient ischemic attack (TIA), and cerebral infarction without residual deficits: Secondary | ICD-10-CM

## 2015-05-10 DIAGNOSIS — M1612 Unilateral primary osteoarthritis, left hip: Secondary | ICD-10-CM

## 2015-05-10 DIAGNOSIS — Z952 Presence of prosthetic heart valve: Secondary | ICD-10-CM

## 2015-05-10 DIAGNOSIS — I712 Thoracic aortic aneurysm, without rupture: Secondary | ICD-10-CM | POA: Diagnosis present

## 2015-05-10 DIAGNOSIS — Z96642 Presence of left artificial hip joint: Secondary | ICD-10-CM

## 2015-05-10 DIAGNOSIS — M25552 Pain in left hip: Secondary | ICD-10-CM | POA: Diagnosis present

## 2015-05-10 DIAGNOSIS — Z419 Encounter for procedure for purposes other than remedying health state, unspecified: Secondary | ICD-10-CM

## 2015-05-10 HISTORY — PX: TOTAL HIP ARTHROPLASTY: SHX124

## 2015-05-10 LAB — TYPE AND SCREEN
ABO/RH(D): A NEG
Antibody Screen: NEGATIVE

## 2015-05-10 LAB — PROTIME-INR
INR: 1.06 (ref 0.00–1.49)
PROTHROMBIN TIME: 14 s (ref 11.6–15.2)

## 2015-05-10 SURGERY — ARTHROPLASTY, HIP, TOTAL, ANTERIOR APPROACH
Anesthesia: Spinal | Site: Hip | Laterality: Left

## 2015-05-10 MED ORDER — LEVOTHYROXINE SODIUM 100 MCG PO TABS
100.0000 ug | ORAL_TABLET | Freq: Every day | ORAL | Status: DC
  Administered 2015-05-11 – 2015-05-13 (×3): 100 ug via ORAL
  Filled 2015-05-10 (×4): qty 1

## 2015-05-10 MED ORDER — METHOCARBAMOL 500 MG PO TABS
500.0000 mg | ORAL_TABLET | Freq: Four times a day (QID) | ORAL | Status: DC | PRN
  Administered 2015-05-11 – 2015-05-13 (×4): 500 mg via ORAL
  Filled 2015-05-10 (×4): qty 1

## 2015-05-10 MED ORDER — METHOCARBAMOL 1000 MG/10ML IJ SOLN
500.0000 mg | Freq: Four times a day (QID) | INTRAVENOUS | Status: DC | PRN
  Filled 2015-05-10: qty 5

## 2015-05-10 MED ORDER — ZOLPIDEM TARTRATE 5 MG PO TABS: 5.0000 mg | ORAL_TABLET | Freq: Every evening | ORAL | Status: DC | PRN

## 2015-05-10 MED ORDER — OXYCODONE HCL 5 MG PO TABS
5.0000 mg | ORAL_TABLET | ORAL | Status: DC | PRN
  Administered 2015-05-10 – 2015-05-13 (×14): 10 mg via ORAL
  Filled 2015-05-10 (×16): qty 2

## 2015-05-10 MED ORDER — FENTANYL CITRATE (PF) 250 MCG/5ML IJ SOLN
INTRAMUSCULAR | Status: AC
  Filled 2015-05-10: qty 5

## 2015-05-10 MED ORDER — 0.9 % SODIUM CHLORIDE (POUR BTL) OPTIME
TOPICAL | Status: DC | PRN
  Administered 2015-05-10: 1000 mL

## 2015-05-10 MED ORDER — BISACODYL 10 MG RE SUPP
10.0000 mg | Freq: Every day | RECTAL | Status: DC | PRN
  Administered 2015-05-13: 10 mg via RECTAL
  Filled 2015-05-10 (×2): qty 1

## 2015-05-10 MED ORDER — FAMOTIDINE 20 MG PO TABS
20.0000 mg | ORAL_TABLET | Freq: Two times a day (BID) | ORAL | Status: DC
  Administered 2015-05-10 – 2015-05-13 (×6): 20 mg via ORAL
  Filled 2015-05-10 (×7): qty 1

## 2015-05-10 MED ORDER — CEFAZOLIN SODIUM-DEXTROSE 2-3 GM-% IV SOLR
2.0000 g | INTRAVENOUS | Status: AC
  Administered 2015-05-10: 2 g via INTRAVENOUS

## 2015-05-10 MED ORDER — ONDANSETRON HCL 4 MG/2ML IJ SOLN: 4.0000 mg | Freq: Four times a day (QID) | INTRAMUSCULAR | Status: DC | PRN

## 2015-05-10 MED ORDER — METOCLOPRAMIDE HCL 5 MG/ML IJ SOLN: 5.0000 mg | Freq: Three times a day (TID) | INTRAMUSCULAR | Status: DC | PRN

## 2015-05-10 MED ORDER — ENOXAPARIN SODIUM 100 MG/ML ~~LOC~~ SOLN
100.0000 mg | Freq: Two times a day (BID) | SUBCUTANEOUS | Status: DC
  Administered 2015-05-11 – 2015-05-13 (×5): 100 mg via SUBCUTANEOUS
  Filled 2015-05-10 (×7): qty 1

## 2015-05-10 MED ORDER — ONDANSETRON HCL 4 MG PO TABS: 4.0000 mg | ORAL_TABLET | Freq: Four times a day (QID) | ORAL | Status: DC | PRN

## 2015-05-10 MED ORDER — ONDANSETRON HCL 4 MG/2ML IJ SOLN
INTRAMUSCULAR | Status: AC
  Filled 2015-05-10: qty 2

## 2015-05-10 MED ORDER — MIDAZOLAM HCL 5 MG/5ML IJ SOLN
INTRAMUSCULAR | Status: DC | PRN
  Administered 2015-05-10 (×2): 1 mg via INTRAVENOUS

## 2015-05-10 MED ORDER — CEFAZOLIN SODIUM-DEXTROSE 2-3 GM-% IV SOLR
INTRAVENOUS | Status: AC
  Filled 2015-05-10: qty 50

## 2015-05-10 MED ORDER — MENTHOL 3 MG MT LOZG: 1.0000 | LOZENGE | OROMUCOSAL | Status: DC | PRN

## 2015-05-10 MED ORDER — DIPHENHYDRAMINE HCL 12.5 MG/5ML PO ELIX: 12.5000 mg | ORAL_SOLUTION | ORAL | Status: DC | PRN

## 2015-05-10 MED ORDER — DEXAMETHASONE SODIUM PHOSPHATE 10 MG/ML IJ SOLN
INTRAMUSCULAR | Status: DC | PRN
  Administered 2015-05-10: 10 mg via INTRAVENOUS

## 2015-05-10 MED ORDER — ROCURONIUM BROMIDE 100 MG/10ML IV SOLN
INTRAVENOUS | Status: AC
  Filled 2015-05-10: qty 1

## 2015-05-10 MED ORDER — ACETAMINOPHEN 325 MG PO TABS: 650.0000 mg | ORAL_TABLET | Freq: Four times a day (QID) | ORAL | Status: DC | PRN

## 2015-05-10 MED ORDER — BUPIVACAINE HCL (PF) 0.5 % IJ SOLN
INTRAMUSCULAR | Status: AC
  Filled 2015-05-10: qty 30

## 2015-05-10 MED ORDER — ALBUTEROL SULFATE (2.5 MG/3ML) 0.083% IN NEBU: 3.0000 mL | INHALATION_SOLUTION | Freq: Four times a day (QID) | RESPIRATORY_TRACT | Status: DC | PRN

## 2015-05-10 MED ORDER — LIDOCAINE HCL (CARDIAC) 20 MG/ML IV SOLN
INTRAVENOUS | Status: AC
  Filled 2015-05-10: qty 5

## 2015-05-10 MED ORDER — DOCUSATE SODIUM 100 MG PO CAPS
100.0000 mg | ORAL_CAPSULE | Freq: Two times a day (BID) | ORAL | Status: DC
  Administered 2015-05-10 – 2015-05-13 (×6): 100 mg via ORAL

## 2015-05-10 MED ORDER — ONDANSETRON HCL 4 MG/2ML IJ SOLN
INTRAMUSCULAR | Status: DC | PRN
  Administered 2015-05-10: 4 mg via INTRAVENOUS

## 2015-05-10 MED ORDER — WARFARIN SODIUM 10 MG PO TABS
10.0000 mg | ORAL_TABLET | Freq: Once | ORAL | Status: AC
  Administered 2015-05-10: 10 mg via ORAL
  Filled 2015-05-10: qty 1

## 2015-05-10 MED ORDER — LACTATED RINGERS IV SOLN
INTRAVENOUS | Status: DC | PRN
  Administered 2015-05-10 (×2): via INTRAVENOUS

## 2015-05-10 MED ORDER — HYDROMORPHONE HCL 2 MG/ML IJ SOLN
INTRAMUSCULAR | Status: AC
  Filled 2015-05-10: qty 1

## 2015-05-10 MED ORDER — LACTATED RINGERS IV SOLN: INTRAVENOUS | Status: DC

## 2015-05-10 MED ORDER — POLYETHYLENE GLYCOL 3350 17 G PO PACK
17.0000 g | PACK | Freq: Every day | ORAL | Status: DC | PRN
  Administered 2015-05-12: 17 g via ORAL
  Filled 2015-05-10: qty 1

## 2015-05-10 MED ORDER — ACETAMINOPHEN 650 MG RE SUPP: 650.0000 mg | Freq: Four times a day (QID) | RECTAL | Status: DC | PRN

## 2015-05-10 MED ORDER — PHENOL 1.4 % MT LIQD
1.0000 | OROMUCOSAL | Status: DC | PRN
Start: 2015-05-10 — End: 2015-05-13
  Filled 2015-05-10: qty 177

## 2015-05-10 MED ORDER — TRANEXAMIC ACID 1000 MG/10ML IV SOLN
1000.0000 mg | INTRAVENOUS | Status: AC
  Administered 2015-05-10: 1000 mg via INTRAVENOUS
  Filled 2015-05-10: qty 10

## 2015-05-10 MED ORDER — SODIUM CHLORIDE 0.9 % IR SOLN
Status: DC | PRN
  Administered 2015-05-10: 1000 mL

## 2015-05-10 MED ORDER — WARFARIN - PHARMACIST DOSING INPATIENT: Freq: Every day | Status: DC

## 2015-05-10 MED ORDER — SODIUM CHLORIDE 0.9 % IV SOLN
INTRAVENOUS | Status: DC
  Administered 2015-05-10 – 2015-05-11 (×2): via INTRAVENOUS

## 2015-05-10 MED ORDER — FENTANYL CITRATE (PF) 250 MCG/5ML IJ SOLN
INTRAMUSCULAR | Status: DC | PRN
  Administered 2015-05-10 (×2): 25 ug via INTRAVENOUS

## 2015-05-10 MED ORDER — PROPOFOL 10 MG/ML IV BOLUS
INTRAVENOUS | Status: AC
  Filled 2015-05-10: qty 20

## 2015-05-10 MED ORDER — MIDAZOLAM HCL 2 MG/2ML IJ SOLN
INTRAMUSCULAR | Status: AC
  Filled 2015-05-10: qty 2

## 2015-05-10 MED ORDER — HYDROMORPHONE HCL 1 MG/ML IJ SOLN
1.0000 mg | INTRAMUSCULAR | Status: DC | PRN
  Administered 2015-05-10: 1 mg via INTRAVENOUS
  Filled 2015-05-10 (×2): qty 1

## 2015-05-10 MED ORDER — CEFAZOLIN SODIUM 1-5 GM-% IV SOLN
1.0000 g | Freq: Four times a day (QID) | INTRAVENOUS | Status: AC
  Administered 2015-05-10 – 2015-05-11 (×2): 1 g via INTRAVENOUS
  Filled 2015-05-10 (×2): qty 50

## 2015-05-10 MED ORDER — PHENYLEPHRINE HCL 10 MG/ML IJ SOLN
INTRAMUSCULAR | Status: DC | PRN
  Administered 2015-05-10: 40 ug via INTRAVENOUS

## 2015-05-10 MED ORDER — ALUM & MAG HYDROXIDE-SIMETH 200-200-20 MG/5ML PO SUSP: 30.0000 mL | ORAL | Status: DC | PRN

## 2015-05-10 MED ORDER — DEXAMETHASONE SODIUM PHOSPHATE 10 MG/ML IJ SOLN
INTRAMUSCULAR | Status: AC
  Filled 2015-05-10: qty 1

## 2015-05-10 MED ORDER — PROPOFOL 10 MG/ML IV BOLUS
INTRAVENOUS | Status: AC
  Filled 2015-05-10: qty 40

## 2015-05-10 MED ORDER — METOCLOPRAMIDE HCL 10 MG PO TABS: 5.0000 mg | ORAL_TABLET | Freq: Three times a day (TID) | ORAL | Status: DC | PRN

## 2015-05-10 MED ORDER — BUPIVACAINE HCL (PF) 0.5 % IJ SOLN
INTRAMUSCULAR | Status: DC | PRN
  Administered 2015-05-10: 15 mg via INTRATHECAL

## 2015-05-10 MED ORDER — HYDROMORPHONE HCL 1 MG/ML IJ SOLN: 0.2500 mg | INTRAMUSCULAR | Status: DC | PRN

## 2015-05-10 MED ORDER — PROPOFOL 500 MG/50ML IV EMUL
INTRAVENOUS | Status: DC | PRN
  Administered 2015-05-10: 75 ug/kg/min via INTRAVENOUS
  Administered 2015-05-10: 125 ug/kg/min via INTRAVENOUS

## 2015-05-10 SURGICAL SUPPLY — 31 items
BAG ZIPLOCK 12X15 (MISCELLANEOUS) IMPLANT
BENZOIN TINCTURE PRP APPL 2/3 (GAUZE/BANDAGES/DRESSINGS) ×2 IMPLANT
BLADE SAW SGTL 18X1.27X75 (BLADE) ×2 IMPLANT
CAPT HIP TOTAL 2 ×2 IMPLANT
CELLS DAT CNTRL 66122 CELL SVR (MISCELLANEOUS) ×1 IMPLANT
CLOTH BEACON ORANGE TIMEOUT ST (SAFETY) ×2 IMPLANT
DRAPE STERI IOBAN 125X83 (DRAPES) ×2 IMPLANT
DRAPE U-SHAPE 47X51 STRL (DRAPES) ×4 IMPLANT
DRSG AQUACEL AG ADV 3.5X10 (GAUZE/BANDAGES/DRESSINGS) ×2 IMPLANT
DURAPREP 26ML APPLICATOR (WOUND CARE) ×2 IMPLANT
ELECT REM PT RETURN 9FT ADLT (ELECTROSURGICAL) ×2
ELECTRODE REM PT RTRN 9FT ADLT (ELECTROSURGICAL) ×1 IMPLANT
GAUZE XEROFORM 1X8 LF (GAUZE/BANDAGES/DRESSINGS) IMPLANT
GLOVE BIO SURGEON STRL SZ7.5 (GLOVE) ×2 IMPLANT
GLOVE BIOGEL PI IND STRL 8 (GLOVE) ×2 IMPLANT
GLOVE BIOGEL PI INDICATOR 8 (GLOVE) ×2
GLOVE ECLIPSE 8.0 STRL XLNG CF (GLOVE) ×2 IMPLANT
GOWN STRL REUS W/TWL XL LVL3 (GOWN DISPOSABLE) ×4 IMPLANT
HANDPIECE INTERPULSE COAX TIP (DISPOSABLE) ×1
HOLDER FOLEY CATH W/STRAP (MISCELLANEOUS) ×2 IMPLANT
PACK ANTERIOR HIP CUSTOM (KITS) ×2 IMPLANT
RTRCTR WOUND ALEXIS 18CM MED (MISCELLANEOUS) ×2
SET HNDPC FAN SPRY TIP SCT (DISPOSABLE) ×1 IMPLANT
STAPLER VISISTAT 35W (STAPLE) IMPLANT
STRIP CLOSURE SKIN 1/2X4 (GAUZE/BANDAGES/DRESSINGS) ×2 IMPLANT
SUT ETHIBOND NAB CT1 #1 30IN (SUTURE) ×2 IMPLANT
SUT MNCRL AB 4-0 PS2 18 (SUTURE) ×2 IMPLANT
SUT VIC AB 0 CT1 36 (SUTURE) ×2 IMPLANT
SUT VIC AB 1 CT1 36 (SUTURE) ×2 IMPLANT
SUT VIC AB 2-0 CT1 27 (SUTURE) ×2
SUT VIC AB 2-0 CT1 TAPERPNT 27 (SUTURE) ×2 IMPLANT

## 2015-05-10 NOTE — Transfer of Care (Signed)
Immediate Anesthesia Transfer of Care Note  Patient: [name] [name]  Procedure(s) Performed: Procedure(s): LEFT TOTAL HIP ARTHROPLASTY ANTERIOR APPROACH (Left)  Patient Location: PACU  Anesthesia Type:MAC and Spinal  Level of Consciousness: awake, alert  and oriented  Airway & Oxygen Therapy: Patient Spontanous Breathing and Patient connected to face mask oxygen  Post-op Assessment: Report given to RN and Post -op Vital signs reviewed and stable  Post vital signs: Reviewed and stable  Last Vitals:  Filed Vitals:   05/10/15 1327  BP: 0000000    Complications: No apparent anesthesia complications

## 2015-05-10 NOTE — Progress Notes (Signed)
ANTICOAGULATION CONSULT NOTE - Initial Consult  Pharmacy Consult for Warfarin Indication: Aortic Valve replacement  No Known Allergies  Patient Measurements: Height: 5\' 10"  (177.8 cm) Weight: 207 lb (93.895 kg) IBW/kg (Calculated) : 73  Vital Signs: Temp: 98.3 F (36.8 C) (12/30 1910) Temp Source: Oral (12/30 1910) BP: 131/93 mmHg (12/30 1910) Pulse Rate: 76 (12/30 1910)  Labs:  Recent Labs  05/10/15 1315  LABPROT 14.0  INR 1.06   Estimated Creatinine Clearance: 105.4 mL/min (by C-G formula based on Cr of 1.03).  Medical History: Past Medical History  Diagnosis Date  . Cancer ([place])     thyroid  . Stroke ([place])   . Aortic stenosis   . Long term current use of anticoagulant 12/03/2014  . H/O aortic valve replacement 12/03/2014  . HLD (hyperlipidemia) 01/03/2015  . BP (high blood pressure) 12/19/2014  . Degenerative arthritis of hip 03/17/2011  . 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 Thoracic aortic aneurysm, without rupture ([place]) 05/01/2015  . Incidental pulmonary nodule, > 44mm and < 69mm left lower lobe 05/01/2015  . H/O bicuspid aortic valve 05/01/2015  . Complication of anesthesia   . PONV (postoperative nausea and vomiting)     nausea  . Environmental allergies   . GERD (gastroesophageal reflux disease)   . S/P thyroidectomy   . Localized tenderness     ANTERIOR NECK DUE TO POST OP THYROIDECTOMY   Medications:  Scheduled:  .  ceFAZolin (ANCEF) IV  1 g Intravenous Q6H  . docusate sodium  100 mg Oral BID  . [START ON 05/11/2015] enoxaparin  100 mg Subcutaneous Q12H  . famotidine  20 mg Oral BID  . [START ON 05/11/2015] levothyroxine  100 mcg Oral QAC breakfast   Assessment: 68 yoM s/p L hip arthroplasty. Chronic Warfarin for mechanical Aortic valve replacement, home dose 10mg  daily exc 11mg  on Tu,Th with last dose 12/24. Lovenox bridging 100mg  SQ q12 from 12/24, last dose 12/29.  Plan to resume Warfarin tonight, resume Lovenox in am at same  dose.  Goal of Therapy:  INR 2-3 (usual goal for mechanical aortic valve) Monitor platelets by anticoagulation protocol: Yes   Plan:   Warfarin 10mg  tonight  Daily PT/INR, CBC  Minda Ditto PharmD Pager [phone] 05/10/2015, 7:44 PM

## 2015-05-10 NOTE — Brief Op Note (Signed)
05/10/2015  3:58 PM  PATIENT:  [name] [name]  44 y.o. male  PRE-OPERATIVE DIAGNOSIS:  Osteoarthritis left hip  POST-OPERATIVE DIAGNOSIS:  Osteoarthritis left hip  PROCEDURE:  Procedure(s): LEFT TOTAL HIP ARTHROPLASTY ANTERIOR APPROACH (Left)  SURGEON:  Surgeon(s) and Role:    * Mcarthur Rossetti, MD - Primary  PHYSICIAN ASSISTANT: Benita Stabile, PA-C  ANESTHESIA:   spinal  EBL:  Total I/O In: 1000 [I.V.:1000] Out: 300 [Blood:300]  BLOOD ADMINISTERED:none  DRAINS: none   LOCAL MEDICATIONS USED:  NONE  SPECIMEN:  No Specimen  DISPOSITION OF SPECIMEN:  N/A  COUNTS:  YES  TOURNIQUET:  * No tourniquets in log *  DICTATION: .Other Dictation: Dictation Number [id number]  PLAN OF CARE: Admit to inpatient   PATIENT DISPOSITION:  PACU - hemodynamically stable.   Delay start of Pharmacological VTE agent (>24hrs) due to surgical blood loss or risk of bleeding: no

## 2015-05-10 NOTE — Op Note (Signed)
NAMELEARY, BABAYEV               ACCOUNT NO.:  [id number]  MEDICAL RECORD NO.:  [id number]  LOCATION:  [id number]                         FACILITY:  [facility]  PHYSICIAN:  Lind Guest. 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  PROCEDURE:  Left total hip arthroplasty through direct anterior approach.  IMPLANTS:  DePuy Sector Gription acetabular component, size 54, size 36+ 4 polyethylene liner, size 10 Corail femoral component with varus offset (KLA), size 36+ 1.5 ceramic hip ball.  SURGEON:  Lind Guest. Ninfa Linden, M.D.  ASSISTANT:  Erskine Emery, PA-C.  ANESTHESIA:  Spinal.  ANTIBIOTICS:  2 g of IV Ancef.  BLOOD LOSS:  300 mL.  COMPLICATIONS:  None.  INDICATIONS:  [name] [name] is only a 44 year old gentleman well known to me.  He is a good body man who unfortunately has had thyroid cancer.  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  It detrimentally affected his activities of daily living, his quality of life and his mobility to the point that he does wish to proceed with a total hip arthroplasty and I did take getting clearance from his cardiologist and the cardiothoracic surgeons to allow him to have this surgery.  He has been on Coumadin chronically and he has been bridging the Lovenox this week.  Lovenox was stopped yesterday, Anesthesia felt it was okay to go with spinal anesthesia.  He understands the risk of acute blood loss anemia; nerve and vessel injury,  fracture, infection, dislocation and DVT.  He understands our goals are decreased pain, improved mobility, and overall improved quality of life.  PROCEDURE DESCRIPTION:  After informed consent was obtained, appropriate left hip was marked.  He was brought to the operating room and on a stretcher, spinal anesthesia was obtained.  He was then laid supine on a stretcher.  Traction boots were placed on both his feet and he was next placed supine on the Hana fracture table with a perineal post in place and both legs in inline skeletal traction devices, but no traction applied.  His left operative hip was then prepped and draped with DuraPrep and sterile drapes.  A time-out was called to identify the correct patient and correct left hip.  We then made a small incision inferior and posterior to the anterior superior iliac spine and carried this obliquely down the leg.  We dissected down the tensor fascia lata muscle and the tensor fascia was then divided longitudinally, so we could proceed with the direct anterior approach to the hip.  We identified and cauterized the circumflex vessels and then identified the hip capsule.  We placed the Cobra retractors around the medial and lateral femoral neck and then opened up the hip capsule in L-type format finding a large joint effusion and found a large  bone spur off the lateral edge of the femoral head and the femoral neck.  We placed the Cobra retractors within the hip capsule and then made our femoral neck cut with an oscillating saw just proximal to the lesser trochanter and completed this with an osteotome.  I placed a corkscrew guide in the femoral head and removed the femoral head in its entirety and found a large area of devoid of cartilage.  We then placed a bent Hohmann over the medial acetabular rim and released the transverse acetabular ligament and then cleaned the acetabular remnants of the acetabular labrum.  We then began reaming from  size 42 reamer up to a size 54 with all reamers under direct visualization and last reamer under direct fluoroscopy, so I could appreciate my depth of reaming, my inclination and anteversion.  Once I was pleased with this, I placed the real DePuy Sector Gription acetabular component, size 54 and a 36+ 4 polyethylene liner.  Attention was then turned to the femur.  With the leg externally rotated to about 110 degrees, extended and adducted, I was able to place a Mueller retractor medially and a Hohmann retractor behind the greater trochanter.  We released the lateral joint capsule and used the box cutting osteotome to enter the femoral canal and a rongeur to lateralize.  We then began broaching from a size 8 broach only to a size 10 because he had a very thick cortical bone.  We trialed a varus offset femoral neck based off his anatomy and a 36+ 1.5 hip ball.  We brought the leg back over and up with traction and internal rotation reducing the pelvis and we were pleased with leg lengths and offset radiographically under fluoro as well as range of motion and stability. We then dislocated the hip and removed the trial components.  We placed the real Corail femoral component size 10 with varus offset followed by the real 36+ 1.5 ceramic hip ball.  We reduced this back in the acetabulum and we were pleased with stability again.  We then irrigated the soft tissue with normal saline solution using pulsatile lavage.  We were able to close the joint capsule with interrupted #1 Ethibond suture followed by running #1 Vicryl in the tensor fascia, 0 Vicryl in the deep tissue, 2-0 Vicryl in the subcutaneous tissue, 4-0 Monocryl subcuticular stitch and Steri-Strips on the skin.  An Aquacel dressing was applied. He was then taken off the Hana table and taken to the recovery room in stable condition.  All final counts were correct.  There were no complications noted.  Of note, Erskine Emery, PA-C assisted  the entire case and his assistance was crucial for facilitating all aspects of this case.     Lind Guest. Ninfa Linden, M.D.     CYB/MEDQ  D:  05/10/2015  T:  05/10/2015  Job:  [id number]

## 2015-05-10 NOTE — H&P (Signed)
TOTAL HIP ADMISSION H&P  Patient is admitted for left total hip arthroplasty.  Subjective:  Chief Complaint: left hip pain  HPI: [name] [name], 44 y.o. male, has a history of pain and functional disability in the left hip(s) due to arthritis and patient has failed non-surgical conservative treatments for greater than 12 weeks to include NSAID's and/or analgesics, corticosteriod injections, flexibility and strengthening excercises, weight reduction as appropriate and activity modification.  Onset of symptoms was gradual starting 3 years ago with gradually worsening course since that time.The patient noted no past surgery on the left hip(s).  Patient currently rates pain in the left hip at 9 out of 10 with activity. Patient has night pain, worsening of pain with activity and weight bearing, pain that interfers with activities of daily living and pain with passive range of motion. Patient has evidence of subchondral sclerosis, periarticular osteophytes and joint space narrowing by imaging studies. This condition presents safety issues increasing the risk of falls.  There is no current active infection.  Patient Active Problem List   Diagnosis Date Noted  . Osteoarthritis of left hip 05/10/2015  . Thoracic aortic aneurysm, without rupture ([place]) 05/01/2015  . Incidental pulmonary nodule, > 35mm and < 68mm left lower lobe 05/01/2015  . H/O bicuspid aortic valve 05/01/2015  . HLD (hyperlipidemia) 01/03/2015  . 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 BP (high blood pressure) 12/19/2014  . Long term current use of anticoagulant 12/03/2014  . H/O aortic valve replacement 12/03/2014  . Degenerative arthritis of hip 03/17/2011   Past Medical History  Diagnosis Date  . Cancer ([place])     thyroid  . Stroke ([place])   . Aortic stenosis   . Long term current use of anticoagulant 12/03/2014  . H/O aortic valve replacement 12/03/2014  . HLD (hyperlipidemia) 01/03/2015  . BP (high blood pressure)  12/19/2014  . Degenerative arthritis of hip 03/17/2011  . 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 Thoracic aortic aneurysm, without rupture ([place]) 05/01/2015  . Incidental pulmonary nodule, > 88mm and < 27mm left lower lobe 05/01/2015  . H/O bicuspid aortic valve 05/01/2015  . Complication of anesthesia   . PONV (postoperative nausea and vomiting)     nausea  . Environmental allergies   . GERD (gastroesophageal reflux disease)   . S/P thyroidectomy   . Localized tenderness     ANTERIOR NECK DUE TO POST OP THYROIDECTOMY    Past Surgical History  Procedure Laterality Date  . Cardiac valve surgery      AVReplacement 2003. Dr Berline Lopes in [facility]  . Thyroidectomy      12/24/2014, Dr Meredith Leeds [facility]    No prescriptions prior to admission   No Known Allergies  Social History  Substance Use Topics  . Smoking status: Never Smoker   . Smokeless tobacco: Former Systems developer    Types: Snuff    Quit date: 11/20/2014  . Alcohol Use: No    Family History  Problem Relation Age of Onset  . Heart disease Mother     before age 67  . Heart disease Father     before age 75     Review of Systems  Musculoskeletal: Positive for joint pain.  All other systems reviewed and are negative.   Objective:  Physical Exam  Constitutional: He is oriented to person, place, and time. He appears well-developed and well-nourished.  HENT:  Head: Normocephalic and atraumatic.  Eyes: EOM are normal. Pupils are equal, round, and reactive to  light.  Neck: Normal range of motion. Neck supple.  Cardiovascular: Normal rate.   Respiratory: Effort normal and breath sounds normal.  GI: Soft. Bowel sounds are normal.  Musculoskeletal:       Left hip: He exhibits decreased range of motion, decreased strength, tenderness and bony tenderness.  Neurological: He is alert and oriented to person, place, and time.  Skin: Skin is warm and dry.  Psychiatric: He has a normal mood and affect.    Vital signs in last  24 hours:    Labs:   There is no height or weight on file to calculate BMI.   Imaging Review Plain radiographs demonstrate severe degenerative joint disease of the left hip(s). The bone quality appears to be excellent for age and reported activity level.  Assessment/Plan:  End stage arthritis, left hip(s)  The patient history, physical examination, clinical judgement of the provider and imaging studies are consistent with end stage degenerative joint disease of the left hip(s) and total hip arthroplasty is deemed medically necessary. The treatment options including medical management, injection therapy, arthroscopy and arthroplasty were discussed at length. The risks and benefits of total hip arthroplasty were presented and reviewed. The risks due to aseptic loosening, infection, stiffness, dislocation/subluxation,  thromboembolic complications and other imponderables were discussed.  The patient acknowledged the explanation, agreed to proceed with the plan and consent was signed. Patient is being admitted for inpatient treatment for surgery, pain control, PT, OT, prophylactic antibiotics, VTE prophylaxis, progressive ambulation and ADL's and discharge planning.The patient is planning to be discharged home with home health services

## 2015-05-10 NOTE — Anesthesia Procedure Notes (Signed)
Spinal Patient location during procedure: OR Start time: 05/10/2015 2:25 PM End time: 05/10/2015 2:37 PM Staffing Anesthesiologist: Rod Mae Performed by: anesthesiologist  Preanesthetic Checklist Completed: patient identified, site marked, surgical consent, pre-op evaluation, timeout performed, IV checked, risks and benefits discussed and monitors and equipment checked Spinal Block Patient position: sitting Prep: Betadine Patient monitoring: heart rate, continuous pulse ox and blood pressure Approach: left paramedian Location: L3-4 Injection technique: single-shot Needle Needle type: Sprotte  Needle gauge: 25 G Needle length: 9 cm Assessment Sensory level: T6 Additional Notes Expiration date of kit checked and confirmed. Patient tolerated procedure well, without complications.

## 2015-05-10 NOTE — Anesthesia Preprocedure Evaluation (Addendum)
Anesthesia Evaluation  Patient identified by MRN, date of birth, ID band Patient awake    Reviewed: Allergy & Precautions, H&P , NPO status , Patient's Chart, lab work & pertinent test results  History of Anesthesia Complications (+) PONV  Airway Mallampati: III  TM Distance: >3 FB Neck ROM: full    Dental no notable dental hx. (+) Dental Advisory Given   Pulmonary neg pulmonary ROS,    Pulmonary exam normal breath sounds clear to auscultation       Cardiovascular Exercise Tolerance: Good hypertension, Pt. on medications + Peripheral Vascular Disease  Normal cardiovascular exam Rhythm:regular Rate:Normal  AS s/p AVR. Thoracic aortic aneurysm 2016   Neuro/Psych CVA negative neurological ROS  negative psych ROS   GI/Hepatic negative GI ROS, Neg liver ROS, GERD  Medicated and Controlled,  Endo/Other  negative endocrine ROS  Renal/GU negative Renal ROS  negative genitourinary   Musculoskeletal   Abdominal   Peds  Hematology negative hematology ROS (+)   Anesthesia Other Findings   Reproductive/Obstetrics negative OB ROS                            Anesthesia Physical Anesthesia Plan  ASA: IV  Anesthesia Plan: Spinal   Post-op Pain Management:    Induction:   Airway Management Planned:   Additional Equipment:   Intra-op Plan:   Post-operative Plan:   Informed Consent: I have reviewed the patients History and Physical, chart, labs and discussed the procedure including the risks, benefits and alternatives for the proposed anesthesia with the patient or authorized representative who has indicated his/her understanding and acceptance.   Dental Advisory Given  Plan Discussed with: CRNA and Surgeon  Anesthesia Plan Comments:        Anesthesia Quick Evaluation

## 2015-05-10 NOTE — Anesthesia Postprocedure Evaluation (Signed)
Anesthesia Post Note  Patient: [name] [name]  Procedure(s) Performed: Procedure(s) (LRB): LEFT TOTAL HIP ARTHROPLASTY ANTERIOR APPROACH (Left)  Patient location during evaluation: PACU Anesthesia Type: Spinal Level of consciousness: awake and alert Pain management: pain level controlled Vital Signs Assessment: post-procedure vital signs reviewed and stable Respiratory status: spontaneous breathing, nonlabored ventilation, respiratory function stable and patient connected to nasal cannula oxygen Cardiovascular status: blood pressure returned to baseline and stable Postop Assessment: no signs of nausea or vomiting Anesthetic complications: no    Last Vitals:  Filed Vitals:   05/10/15 1645 05/10/15 1700  BP: 108/80 104/81  Pulse: 79 73  Temp:  36.7 C  Resp: 20 15    Last Pain: There were no vitals filed for this visit.          L Sensory Level: L2-Upper inner thigh, upper buttock (05/10/15 1700) R Sensory Level: L2-Upper inner thigh, upper buttock (05/10/15 1700)  Shakhia Gramajo L

## 2015-05-11 LAB — BASIC METABOLIC PANEL WITH GFR
Anion gap: 9 (ref 5–15)
BUN: 17 mg/dL (ref 6–20)
CO2: 27 mmol/L (ref 22–32)
Calcium: 7.2 mg/dL — ABNORMAL LOW (ref 8.9–10.3)
Chloride: 102 mmol/L (ref 101–111)
Creatinine, Ser: 0.93 mg/dL (ref 0.61–1.24)
GFR calc Af Amer: >60 mL/min (ref >60)
GFR calc non Af Amer: >60 mL/min (ref >60)
Glucose, Bld: 180 mg/dL — ABNORMAL HIGH (ref 65–99)
Potassium: 4.3 mmol/L (ref 3.5–5.1)
Sodium: 138 mmol/L (ref 135–145)

## 2015-05-11 LAB — CBC
HCT: 38.4 % — ABNORMAL LOW (ref 39.0–52.0)
HEMOGLOBIN: 12.4 g/dL — AB (ref 13.0–17.0)
MCH: 29.7 pg (ref 26.0–34.0)
MCHC: 32.3 g/dL (ref 30.0–36.0)
MCV: 92.1 fL (ref 78.0–100.0)
Platelets: 223 10*3/uL (ref 150–400)
RBC: 4.17 MIL/uL — AB (ref 4.22–5.81)
RDW: 13.7 % (ref 11.5–15.5)
WBC: 11.6 10*3/uL — ABNORMAL HIGH (ref 4.0–10.5)

## 2015-05-11 LAB — PROTIME-INR
INR: 1.06 (ref 0.00–1.49)
Prothrombin Time: 14 seconds (ref 11.6–15.2)

## 2015-05-11 MED ORDER — WARFARIN SODIUM 2.5 MG PO TABS
12.5000 mg | ORAL_TABLET | Freq: Once | ORAL | Status: AC
  Administered 2015-05-11: 12.5 mg via ORAL
  Filled 2015-05-11: qty 1

## 2015-05-11 MED ORDER — GABAPENTIN 300 MG PO CAPS
300.0000 mg | ORAL_CAPSULE | Freq: Every day | ORAL | Status: DC
  Administered 2015-05-11 – 2015-05-12 (×2): 300 mg via ORAL
  Filled 2015-05-11 (×4): qty 1

## 2015-05-11 NOTE — Care Management Note (Addendum)
Case Management Note  Patient Details  Name: [name] [name] MRN: [id number] Date of Birth: Sep 11, 1970  Subjective/Objective:     LEFT TOTAL HIP ARTHROPLASTY               Action/Plan: NCM spoke to pt. Offered choice for [facility]. Pt agreeable to [facility] for [facility]. Has RW and 3n1 at home. Wife at home to assist with his care.    Expected Discharge Date:  05/12/15               Expected Discharge Plan:  [place]  In-House Referral:  NA  Discharge planning Services  CM Consult  Post Acute Care Choice:  Home Health Choice offered to:  Patient  DME Arranged:  N/A DME Agency:  NA  HH Arranged:  PT HH Agency:  [place]  Status of Service:  Completed, signed off  Medicare Important Message Given:    Date Medicare IM Given:    Medicare IM give by:    Date Additional Medicare IM Given:    Additional Medicare Important Message give by:     If discussed at [place] of Stay Meetings, dates discussed:    Additional Comments:  Erenest Rasher, RN 05/11/2015, 10:50 AM

## 2015-05-11 NOTE — Progress Notes (Signed)
Subjective: 1 Day Post-Op Procedure(s) (LRB): LEFT TOTAL HIP ARTHROPLASTY ANTERIOR APPROACH (Left) Patient reports pain as moderate.   Burning left hip incision. Objective: Vital signs in last 24 hours: Temp:  [97.7 F (36.5 C)-98.7 F (37.1 C)] 98.7 F (37.1 C) (12/31 0523) Pulse Rate:  [68-86] 68 (12/31 0523) Resp:  [12-20] 16 (12/31 0523) BP: (96-132)/(64-103) 114/73 mmHg (12/31 0523) SpO2:  [96 %-100 %] 98 % (12/31 0523) Weight:  [93.895 kg (207 lb)] 93.895 kg (207 lb) (12/30 1308)  Intake/Output from previous day: 12/30 0701 - 12/31 0700 In: 3710 [P.O.:1040; I.V.:2620; IV Piggyback:50] Out: [id number] [Urine:1550; Blood:300] Intake/Output this shift:     Recent Labs  05/11/15 0503  HGB 12.4*    Recent Labs  05/11/15 0503  WBC 11.6*  RBC 4.17*  HCT 38.4*  PLT 223    Recent Labs  05/11/15 0503  NA 138  K 4.3  CL 102  CO2 27  BUN 17  CREATININE 0.93  GLUCOSE 180*  CALCIUM 7.2*    Recent Labs  05/10/15 1315 05/11/15 0503  INR 1.06 1.06    Sensation intact distally Intact pulses distally Dorsiflexion/Plantar flexion intact Incision: scant drainage Compartment soft  Assessment/Plan: 1 Day Post-Op Procedure(s) (LRB): LEFT TOTAL HIP ARTHROPLASTY ANTERIOR APPROACH (Left) Up with therapy Start Neurontin for burning pain  CLARK, GILBERT 05/11/2015, 10:32 AM

## 2015-05-11 NOTE — Progress Notes (Addendum)
ANTICOAGULATION CONSULT NOTE - follow-up Consult  Pharmacy Consult for Warfarin Indication: Aortic Valve replacement/VTE prophyplaxis  No Known Allergies  Patient Measurements: Height: 5\' 10"  (177.8 cm) Weight: 207 lb (93.895 kg) IBW/kg (Calculated) : 73  Vital Signs: Temp: 98.3 F (36.8 C) (12/31 0930) Temp Source: Oral (12/31 0930) BP: 116/72 mmHg (12/31 0930) Pulse Rate: 66 (12/31 0930)  Labs:  Recent Labs  05/10/15 1315 05/11/15 0503  HGB  --  12.4*  HCT  --  38.4*  PLT  --  223  LABPROT 14.0 14.0  INR 1.06 1.06  CREATININE  --  0.93   Estimated Creatinine Clearance: 116.7 mL/min (by C-G formula based on Cr of 0.93).  Medical History: Past Medical History  Diagnosis Date  . Cancer ([place])     thyroid  . Stroke ([place])   . Aortic stenosis   . Long term current use of anticoagulant 12/03/2014  . H/O aortic valve replacement 12/03/2014  . HLD (hyperlipidemia) 01/03/2015  . BP (high blood pressure) 12/19/2014  . Degenerative arthritis of hip 03/17/2011  . 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 Thoracic aortic aneurysm, without rupture ([place]) 05/01/2015  . Incidental pulmonary nodule, > 68mm and < 4mm left lower lobe 05/01/2015  . H/O bicuspid aortic valve 05/01/2015  . Complication of anesthesia   . PONV (postoperative nausea and vomiting)     nausea  . Environmental allergies   . GERD (gastroesophageal reflux disease)   . S/P thyroidectomy   . Localized tenderness     ANTERIOR NECK DUE TO POST OP THYROIDECTOMY   Assessment: 65 yoM s/p L hip arthroplasty. Chronic Warfarin for mechanical Aortic valve replacement, home dose 10mg  daily exc 11mg  on Tu,Th with last dose 12/24. Lovenox bridging 100mg  SQ q12 from 12/24, last dose 12/29.  Plan to resume Warfarin tonight, resume Lovenox in am at same dose.  Today's labs, 05/11/2015  INR subtherapeutic  CBC: stable, no issues  Enoxaparin 100mg  SQ BID resumed this am  No drug-drug interactions  Goal of  Therapy:  INR 2-3 (usual goal for mechanical aortic valve) Monitor platelets by anticoagulation protocol: Yes   Plan:   Warfarin 12.5mg  tonight - small booster dose to get INR to begin trending up  Daily PT/INR  Doreene Eland, PharmD, BCPS.   Pager: [phone] 05/11/2015 12:33 PM

## 2015-05-11 NOTE — Evaluation (Signed)
Physical Therapy Evaluation Patient Details Name: [name] [name] MRN: [id number] DOB: 12-20-1970 Today's Date: 05/11/2015   History of Present Illness  s/p L THR;  Hx of thyroid ca, CVA and OA bilat hips  Clinical Impression  Pt s/p L THR presents with decreased L LE strength/ROM, post op pain and elevated anxiety level limiting functional mobility.  Pt should progress to dc home with family assist and HHPT follow up.    Follow Up Recommendations Home health PT    Equipment Recommendations  Rolling walker with 5" wheels    Recommendations for Other Services OT consult     Precautions / Restrictions Precautions Precautions: Fall Precaution Comments: Elevated anxiety level Restrictions Weight Bearing Restrictions: No      Mobility  Bed Mobility Overal bed mobility: Needs Assistance Bed Mobility: Supine to Sit     Supine to sit: Min assist;Mod assist     General bed mobility comments: cues for sequence and use of R LE to self assist  Transfers Overall transfer level: Needs assistance Equipment used: Rolling walker (2 wheeled) Transfers: Sit to/from Stand Sit to Stand: Min assist;Mod assist         General transfer comment: cues for LE management and use of UEs to self assist  Ambulation/Gait Ambulation/Gait assistance: Min assist;+2 physical assistance;+2 safety/equipment Ambulation Distance (Feet): 90 Feet Assistive device: Rolling walker (2 wheeled) Gait Pattern/deviations: Step-to pattern;Decreased step length - right;Decreased step length - left;Shuffle;Trunk flexed Gait velocity: decr   General Gait Details: cues for sequence, posture and position from [employer]            Wheelchair Mobility    Modified Rankin (Stroke Patients Only)       Balance                                             Pertinent Vitals/Pain Pain Assessment: 0-10 Pain Score: 5  Pain Location: L hip/thigh Pain Descriptors / Indicators:  Aching;Burning;Sore Pain Intervention(s): Limited activity within patient's tolerance;Monitored during session;Premedicated before session;Ice applied    Home Living Family/patient expects to be discharged to:: Private residence Living Arrangements: Spouse/significant other Available Help at Discharge: Family Type of Home: House Home Access: Stairs to enter   Technical brewer of Steps: 1+1 Home Layout: Able to live on main level with bedroom/bathroom Home Equipment: Cane - single point      Prior Function Level of Independence: Independent               Hand Dominance        Extremity/Trunk Assessment   Upper Extremity Assessment: Overall WFL for tasks assessed           Lower Extremity Assessment: LLE deficits/detail   LLE Deficits / Details: Strength at hip 2-/5 with AAROM at hip to 60 flex and 15 abd  Cervical / Trunk Assessment: Normal  Communication   Communication: No difficulties  Cognition Arousal/Alertness: Awake/alert Behavior During Therapy: WFL for tasks assessed/performed Overall Cognitive Status: Within Functional Limits for tasks assessed                      General Comments      Exercises Total Joint Exercises Ankle Circles/Pumps: AROM;Both;15 reps;Supine Quad Sets: AROM;Both;10 reps;Supine Heel Slides: AAROM;20 reps;Supine;Left Hip ABduction/ADduction: Left;15 reps;Supine;AAROM      Assessment/Plan    PT Assessment Patient needs continued PT  services  PT Diagnosis Difficulty walking   PT Problem List Decreased strength;Decreased range of motion;Decreased activity tolerance;Decreased mobility;Decreased knowledge of use of DME;Pain  PT Treatment Interventions DME instruction;Gait training;Stair training;Functional mobility training;Therapeutic activities;Therapeutic exercise;Patient/family education   PT Goals (Current goals can be found in the Care Plan section) Acute Rehab PT Goals Patient Stated Goal: Regain IND and  walk with less pain PT Goal Formulation: With patient Time For Goal Achievement: 05/18/15 Potential to Achieve Goals: Good    Frequency 7X/week   Barriers to discharge        Co-evaluation               End of Session Equipment Utilized During Treatment: Gait belt Activity Tolerance: Patient tolerated treatment well Patient left: in chair;with call bell/phone within reach;with family/visitor present Nurse Communication: Mobility status         Time: 0915-1003 PT Time Calculation (min) (ACUTE ONLY): 48 min   Charges:   PT Evaluation $Initial PT Evaluation Tier I: 1 Procedure PT Treatments $Gait Training: 8-22 mins $Therapeutic Exercise: 8-22 mins   PT G Codes:        Corran Lalone May 18, 2015, 1:01 PM

## 2015-05-11 NOTE — Progress Notes (Signed)
Physical Therapy Treatment Patient Details Name: [name] [name] MRN: [id number] DOB: [birth year] Today's Date: 05/11/2015    History of Present Illness s/p L THR;  Hx of thyroid ca, CVA and OA bilat hips    PT Comments    Pt progressing well with mobility.   Follow Up Recommendations  Home health PT     Equipment Recommendations  Rolling walker with 5" wheels    Recommendations for Other Services OT consult     Precautions / Restrictions Precautions Precautions: Fall Precaution Comments: Elevated anxiety level Restrictions Weight Bearing Restrictions: No Other Position/Activity Restrictions: WBAT    Mobility  Bed Mobility               General bed mobility comments: Pt declines back to bed  Transfers Overall transfer level: Needs assistance Equipment used: Rolling walker (2 wheeled) Transfers: Sit to/from Stand Sit to Stand: Min guard         General transfer comment: min guard assist from recliner.  Cues for LE management and use of UEs to self assist  Ambulation/Gait Ambulation/Gait assistance: Min assist Ambulation Distance (Feet): 200 Feet Assistive device: Rolling walker (2 wheeled) Gait Pattern/deviations: Step-to pattern;Step-through pattern;Decreased step length - right;Decreased step length - left;Shuffle;Trunk flexed Gait velocity: decr   General Gait Details: cues for sequence, posture and position from RW.  Progressed to recip gait   Stairs            Wheelchair Mobility    Modified Rankin (Stroke Patients Only)       Balance                                    Cognition Arousal/Alertness: Awake/alert Behavior During Therapy: WFL for tasks assessed/performed Overall Cognitive Status: Within Functional Limits for tasks assessed                      Exercises      General Comments General comments (skin integrity, edema, etc.): wife very supportive       Pertinent Vitals/Pain Pain Assessment:  0-10 Pain Score: 4  Pain Location: Lt hip/thigh Pain Descriptors / Indicators: Aching;Sore Pain Intervention(s): Limited activity within patient's tolerance;Premedicated before session;Monitored during session    [place] expects to be discharged to:: Private residence Living Arrangements: Spouse/significant other Available Help at Discharge: Family Type of Home: House Home Access: Stairs to enter   Home Layout: Able to live on main level with bedroom/bathroom Home Equipment: Kasandra Knudsen - single point      Prior Function Level of Independence: Independent      Comments: works full time    PT Goals (current goals can now be found in the care plan section) Acute Rehab PT Goals Patient Stated Goal: "ride my horse again" PT Goal Formulation: With patient Time For Goal Achievement: 05/18/15 Potential to Achieve Goals: Good Progress towards PT goals: Progressing toward goals    Frequency  7X/week    PT Plan Current plan remains appropriate    Co-evaluation             End of Session Equipment Utilized During Treatment: Gait belt Activity Tolerance: Patient tolerated treatment well Patient left: in chair;with call bell/phone within reach;with family/visitor present     Time: 1350-1413 PT Time Calculation (min) (ACUTE ONLY): 23 min  Charges:  $Gait Training: 23-37 mins  G Codes:      Leshon Armistead 05/14/2015, 4:16 PM

## 2015-05-11 NOTE — Evaluation (Signed)
Occupational Therapy Evaluation Patient Details Name: Arpan Arnwine MRN: [id number] DOB: 10-Jul-1970 Today's Date: 05/11/2015    History of Present Illness s/p L THR;  Hx of thyroid ca, CVA and OA bilat hips   Clinical Impression   Pt admitted with above. He demonstrates the below listed deficits and will benefit from continued OT to maximize safety and independence with BADLs.  Pt requires mod A for LB ADLs.  Will assess need for AE next visit.  Wife very supportive.  Will follow.       Follow Up Recommendations  No OT follow up;Supervision/Assistance - 24 hour    Equipment Recommendations  3 in 1 bedside comode    Recommendations for Other Services       Precautions / Restrictions Precautions Precautions: Fall Precaution Comments: Elevated anxiety level Restrictions Weight Bearing Restrictions: No      Mobility Bed Mobility Overal bed mobility: Needs Assistance Bed Mobility: Supine to Sit     Supine to sit: Min assist;Mod assist     General bed mobility comments: cues for sequence and use of R LE to self assist  Transfers Overall transfer level: Needs assistance Equipment used: Rolling walker (2 wheeled) Transfers: Sit to/from [facility] Sit to Stand: Mod assist;Min guard         General transfer comment: min guard assist from recliner, mod A from commode.     Balance                                            ADL Overall ADL's : Needs assistance/impaired Eating/Feeding: Independent   Grooming: Wash/dry hands;Wash/dry face;Oral care;Brushing hair;Min guard;Standing   Upper Body Bathing: Set up;Sitting   Lower Body Bathing: Moderate assistance;Sit to/from stand   Upper Body Dressing : Set up;Sitting   Lower Body Dressing: Moderate assistance;Sit to/from stand   Toilet Transfer: Moderate assistance;Ambulation;Comfort height toilet;Grab bars;RW Armed forces technical officer Details (indicate cue type and reason): Pt  required assist to move sit to stand due to increased pain  Toileting- Clothing Manipulation and Hygiene: Minimal assistance;Sit to/from stand       Functional mobility during ADLs: Minimal assistance;Rolling walker General ADL Comments: Pt unable to access Lt foot for LB ADLs.       Vision     Perception     Praxis      Pertinent Vitals/Pain Pain Assessment: 0-10 Pain Score: 5  Pain Location: Lt hip/thigh Pain Descriptors / Indicators: Aching;Burning;Cramping;Spasm Pain Intervention(s): Monitored during session;Ice applied;Repositioned;RN gave pain meds during session     Hand Dominance     Extremity/Trunk Assessment Upper Extremity Assessment Upper Extremity Assessment: Overall WFL for tasks assessed   Lower Extremity Assessment Lower Extremity Assessment: Defer to PT evaluation LLE Deficits / Details: Strength at hip 2-/5 with AAROM at hip to 60 flex and 15 abd   Cervical / Trunk Assessment Cervical / Trunk Assessment: Normal   Communication Communication Communication: No difficulties   Cognition Arousal/Alertness: Awake/alert Behavior During Therapy: WFL for tasks assessed/performed Overall Cognitive Status: Within Functional Limits for tasks assessed                     General Comments       Exercises Exercises: Total Joint     Shoulder Instructions      Home Living Family/patient expects to be discharged to:: Private residence Living Arrangements: Spouse/significant other Available  Help at Discharge: Family Type of Home: House Home Access: Stairs to enter Technical brewer of Steps: 1+1   Home Layout: Able to live on main level with bedroom/bathroom     Bathroom Shower/Tub: Tub/shower unit;Curtain Shower/tub characteristics: Architectural technologist: Standard     Home Equipment: Kasandra Knudsen - single point          Prior Functioning/Environment Level of Independence: Independent        Comments: works full time     OT  Diagnosis: Acute pain;Generalized weakness   OT Problem List: Decreased strength;Decreased activity tolerance;Decreased safety awareness;Decreased knowledge of use of DME or AE;Pain   OT Treatment/Interventions: Self-care/ADL training;DME and/or AE instruction;Therapeutic activities;Patient/family education    OT Goals(Current goals can be found in the care plan section) Acute Rehab OT Goals Patient Stated Goal: "ride my horse again" OT Goal Formulation: With family Time For Goal Achievement: 05/18/15 Potential to Achieve Goals: Good ADL Goals Pt Will Perform Lower Body Bathing: with supervision;with adaptive equipment;sit to/from stand Pt Will Perform Lower Body Dressing: with supervision;with adaptive equipment;sit to/from stand Pt Will Transfer to Toilet: with supervision;ambulating;regular height toilet;bedside commode;grab bars Pt Will Perform Toileting - Clothing Manipulation and hygiene: with supervision;sit to/from stand Pt Will Perform Tub/Shower Transfer: Tub transfer;with min assist;ambulating;shower seat;rolling walker;3 in 1  OT Frequency: Min 2X/week   Barriers to D/C:            Co-evaluation              End of Session Equipment Utilized During Treatment: Surveyor, mining Communication: Mobility status;Patient requests pain meds  Activity Tolerance: Patient limited by pain Patient left: in chair;with call bell/phone within reach;with family/visitor present   Time: [id number] OT Time Calculation (min): 37 min Charges:  OT General Charges $OT Visit: 1 Procedure OT Evaluation $Initial OT Evaluation Tier I: 1 Procedure OT Treatments $Self Care/Home Management : 8-22 mins G-Codes:    Wynne Rozak M 05-30-2015, 1:10 PM

## 2015-05-12 LAB — PROTIME-INR
INR: 1.13 (ref 0.00–1.49)
Prothrombin Time: 14.6 seconds (ref 11.6–15.2)

## 2015-05-12 MED ORDER — WARFARIN SODIUM 2.5 MG PO TABS
12.5000 mg | ORAL_TABLET | Freq: Once | ORAL | Status: AC
  Administered 2015-05-12: 12.5 mg via ORAL
  Filled 2015-05-12: qty 1

## 2015-05-12 NOTE — Progress Notes (Signed)
Subjective: 2 Days Post-Op Procedure(s) (LRB): LEFT TOTAL HIP ARTHROPLASTY ANTERIOR APPROACH (Left) Patient reports pain as moderate to severe .   Out in hallway with PT. Objective: Vital signs in last 24 hours: Temp:  [98.6 F (37 C)-100.9 F (38.3 C)] 100.9 F (38.3 C) (01/01 0631) Pulse Rate:  [69-92] 92 (01/01 0631) Resp:  [16] 16 (01/01 0631) BP: (107-117)/(70-82) 115/70 mmHg (01/01 0631) SpO2:  [98 %-100 %] 98 % (01/01 0631)  Intake/Output from previous day: 12/31 0701 - 01/01 0700 In: 840 [P.O.:840] Out: 525 [Urine:525] Intake/Output this shift: Total I/O In: 240 [P.O.:240] Out: -    Recent Labs  05/11/15 0503  HGB 12.4*    Recent Labs  05/11/15 0503  WBC 11.6*  RBC 4.17*  HCT 38.4*  PLT 223    Recent Labs  05/11/15 0503  NA 138  K 4.3  CL 102  CO2 27  BUN 17  CREATININE 0.93  GLUCOSE 180*  CALCIUM 7.2*    Recent Labs  05/11/15 0503 05/12/15 0542  INR 1.06 1.13    Walking well with PT in hallway  Assessment/Plan: 2 Days Post-Op Procedure(s) (LRB): LEFT TOTAL HIP ARTHROPLASTY ANTERIOR APPROACH (Left) Up with therapy  Possible discharge to home tomorrow.  Erskine Emery 05/12/2015, 2:14 PM

## 2015-05-12 NOTE — Progress Notes (Signed)
Physical Therapy Treatment Patient Details Name: [name] [name] MRN: [id number] DOB: 12/10/1970 Today's Date: 2015/05/25    History of Present Illness s/p L THR;  Hx of thyroid ca, CVA and OA bilat hips    PT Comments    Pt moving slowly this am.  Performed therex, will return to ambulate to allow pt rest break.  Follow Up Recommendations  Home health PT     Equipment Recommendations  Rolling walker with 5" wheels    Recommendations for Other Services OT consult     Precautions / Restrictions Precautions Precautions: Fall Restrictions Weight Bearing Restrictions: No Other Position/Activity Restrictions: WBAT    Mobility  Bed Mobility Overal bed mobility: Needs Assistance Bed Mobility: Supine to Sit     Supine to sit: Min assist     General bed mobility comments: up in chair with OT  Transfers Overall transfer level: Needs assistance Equipment used: Rolling walker (2 wheeled) Transfers: Sit to/from Stand Sit to Stand: Min assist         General transfer comment: min assist initially from EOB. Min guard assist from 3in1. cues for hand placement.  Ambulation/Gait                 Stairs            Wheelchair Mobility    Modified Rankin (Stroke Patients Only)       Balance                                    Cognition Arousal/Alertness: Awake/alert Behavior During Therapy: WFL for tasks assessed/performed Overall Cognitive Status: Within Functional Limits for tasks assessed                      Exercises Total Joint Exercises Ankle Circles/Pumps: AROM;Both;15 reps;Supine Quad Sets: AROM;Both;10 reps;Supine Gluteal Sets: AROM;Both;10 reps;Supine Heel Slides: AAROM;20 reps;Supine;Left Hip ABduction/ADduction: Left;15 reps;Supine;AAROM    General Comments        Pertinent Vitals/Pain Pain Assessment: 0-10 Pain Score: 6  Pain Location: L hip/thigh Pain Descriptors / Indicators: Aching;Sore Pain  Intervention(s): Monitored during session;Limited activity within patient's tolerance;Premedicated before session;Ice applied    Home Living                      Prior Function            PT Goals (current goals can now be found in the care plan section) Acute Rehab PT Goals Patient Stated Goal: "ride my horse again" PT Goal Formulation: With patient Time For Goal Achievement: 05/18/15 Potential to Achieve Goals: Good Progress towards PT goals: Progressing toward goals    Frequency  7X/week    PT Plan Current plan remains appropriate    Co-evaluation             End of Session   Activity Tolerance: Patient limited by fatigue;No increased pain Patient left: in chair;with call bell/phone within reach;with family/visitor present     Time: [id number] PT Time Calculation (min) (ACUTE ONLY): 23 min  Charges:  $Therapeutic Exercise: 23-37 mins                    G Codes:      Chelsey Kimberley 05-25-2015, 12:22 PM

## 2015-05-12 NOTE — Progress Notes (Signed)
Physical Therapy Treatment Patient Details Name: Arnulfo Caffrey MRN: [id number] DOB: 1970/11/16 Today's Date: 05/12/2015    History of Present Illness s/p L THR;  Hx of thyroid ca, CVA and OA bilat hips    PT Comments    Pt continues motivated but moving slowly this am 2* increased stiffness.  Follow Up Recommendations  Home health PT     Equipment Recommendations  Rolling walker with 5" wheels    Recommendations for Other Services OT consult     Precautions / Restrictions Precautions Precautions: Fall Restrictions Weight Bearing Restrictions: No Other Position/Activity Restrictions: WBAT    Mobility  Bed Mobility Overal bed mobility: Needs Assistance Bed Mobility: Supine to Sit     Supine to sit: Min assist     General bed mobility comments: up in chair with OT  Transfers Overall transfer level: Needs assistance Equipment used: Rolling walker (2 wheeled) Transfers: Sit to/from Stand Sit to Stand: Min guard         General transfer comment: cues for LE management and use of UEs to self assist`  Ambulation/Gait Ambulation/Gait assistance: Min assist Ambulation Distance (Feet): 100 Feet Assistive device: Rolling walker (2 wheeled) Gait Pattern/deviations: Step-to pattern;Decreased step length - right;Decreased step length - left;Shuffle;Trunk flexed Gait velocity: decr Gait velocity interpretation: Below normal speed for age/gender General Gait Details: Increased time.  Cues for sequence, posture and position from RW.  Assist initially to advance L LE   Stairs            Wheelchair Mobility    Modified Rankin (Stroke Patients Only)       Balance                                    Cognition Arousal/Alertness: Awake/alert Behavior During Therapy: WFL for tasks assessed/performed Overall Cognitive Status: Within Functional Limits for tasks assessed                      Exercises Total Joint Exercises Ankle  Circles/Pumps: AROM;Both;15 reps;Supine Quad Sets: AROM;Both;10 reps;Supine Gluteal Sets: AROM;Both;10 reps;Supine Heel Slides: AAROM;20 reps;Supine;Left Hip ABduction/ADduction: Left;15 reps;Supine;AAROM    General Comments        Pertinent Vitals/Pain Pain Assessment: 0-10 Pain Score: 5  Pain Location: L hip/thigh Pain Descriptors / Indicators: Aching;Sore Pain Intervention(s): Limited activity within patient's tolerance;Monitored during session;Premedicated before session;Ice applied    Home Living                      Prior Function            PT Goals (current goals can now be found in the care plan section) Acute Rehab PT Goals Patient Stated Goal: "ride my horse again" PT Goal Formulation: With patient Time For Goal Achievement: 05/18/15 Potential to Achieve Goals: Good Progress towards PT goals: Progressing toward goals    Frequency  7X/week    PT Plan Current plan remains appropriate    Co-evaluation             End of Session Equipment Utilized During Treatment: Gait belt Activity Tolerance: Patient limited by fatigue;Patient limited by pain Patient left: in chair;with call bell/phone within reach;with family/visitor present     Time: [id number] PT Time Calculation (min) (ACUTE ONLY): 25 min  Charges:  $Gait Training: 23-37 mins $Therapeutic Exercise: 23-37 mins  G Codes:      Camillia Marcy May 24, 2015, 12:28 PM

## 2015-05-12 NOTE — Progress Notes (Signed)
Physical Therapy Treatment Patient Details Name: [name] [name] MRN: [id number] DOB: 08/04/1970 Today's Date: 05/12/2015    History of Present Illness s/p L THR;  Hx of thyroid ca, CVA and OA bilat hips    PT Comments    Pt continues motivated and with steady progress toward IND.  Pt hopeful for dc tomorrow.  Follow Up Recommendations  Home health PT     Equipment Recommendations  Rolling walker with 5" wheels    Recommendations for Other Services OT consult     Precautions / Restrictions Precautions Precautions: Fall Restrictions Weight Bearing Restrictions: No Other Position/Activity Restrictions: WBAT    Mobility  Bed Mobility Overal bed mobility: Needs Assistance Bed Mobility: Supine to Sit     Supine to sit: Min assist     General bed mobility comments: min cues for sequence.  Assist with L LE  Transfers Overall transfer level: Needs assistance Equipment used: Rolling walker (2 wheeled) Transfers: Sit to/from Stand Sit to Stand: Min guard         General transfer comment: cues for LE management and use of UEs to self assist`  Ambulation/Gait Ambulation/Gait assistance: Min guard Ambulation Distance (Feet): 222 Feet Assistive device: Rolling walker (2 wheeled) Gait Pattern/deviations: Step-to pattern;Step-through pattern;Decreased step length - right;Decreased step length - left;Shuffle Gait velocity: decr   General Gait Details: Increased time. min cues for sequence, posture and position from RW.  Assist initially to advance L LE   Stairs Stairs: Yes Stairs assistance: Min assist Stair Management: No rails;Backwards;With walker;Step to pattern Number of Stairs: 2 General stair comments: Single step twice bkwd with cues for sequence and foot/RW placement  Wheelchair Mobility    Modified Rankin (Stroke Patients Only)       Balance                                    Cognition Arousal/Alertness: Awake/alert Behavior During  Therapy: WFL for tasks assessed/performed Overall Cognitive Status: Within Functional Limits for tasks assessed                      Exercises      General Comments        Pertinent Vitals/Pain Pain Assessment: 0-10 Pain Score: 3  Pain Location: L hip/thigh Pain Descriptors / Indicators: Aching;Sore Pain Intervention(s): Limited activity within patient's tolerance;Monitored during session;Premedicated before session;Ice applied    Home Living                      Prior Function            PT Goals (current goals can now be found in the care plan section) Acute Rehab PT Goals Patient Stated Goal: "ride my horse again" PT Goal Formulation: With patient Time For Goal Achievement: 05/18/15 Potential to Achieve Goals: Good Progress towards PT goals: Progressing toward goals    Frequency  7X/week    PT Plan Current plan remains appropriate    Co-evaluation             End of Session Equipment Utilized During Treatment: Gait belt Activity Tolerance: Patient tolerated treatment well Patient left: Other (comment) (bathroom)     Time: 1350-1430 PT Time Calculation (min) (ACUTE ONLY): 40 min  Charges:  $Gait Training: 23-37 mins $Therapeutic Activity: 8-22 mins  G Codes:      [name] [name] 05-25-2015, 4:09 PM

## 2015-05-12 NOTE — Progress Notes (Signed)
ANTICOAGULATION CONSULT NOTE - follow-up Consult  Pharmacy Consult for Warfarin Indication: Aortic Valve replacement/VTE prophyplaxis  No Known Allergies  Patient Measurements: Height: 5\' 10"  (177.8 cm) Weight: 207 lb (93.895 kg) IBW/kg (Calculated) : 73  Vital Signs: Temp: 100.9 F (38.3 C) (01/01 0631) Temp Source: Oral (01/01 0631) BP: 115/70 mmHg (01/01 0631) Pulse Rate: 92 (01/01 0631)  Labs:  Recent Labs  05/10/15 1315 05/11/15 0503 05/12/15 0542  HGB  --  12.4*  --   HCT  --  38.4*  --   PLT  --  223  --   LABPROT 14.0 14.0 14.6  INR 1.06 1.06 1.13  CREATININE  --  0.93  --    Estimated Creatinine Clearance: 116.7 mL/min (by C-G formula based on Cr of 0.93).  Medical History: Past Medical History  Diagnosis Date  . Cancer ([place])     thyroid  . Stroke ([place])   . Aortic stenosis   . Long term current use of anticoagulant 12/03/2014  . H/O aortic valve replacement 12/03/2014  . HLD (hyperlipidemia) 01/03/2015  . BP (high blood pressure) 12/19/2014  . Degenerative arthritis of hip 03/17/2011  . 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 Thoracic aortic aneurysm, without rupture ([place]) 05/01/2015  . Incidental pulmonary nodule, > 67mm and < 88mm left lower lobe 05/01/2015  . H/O bicuspid aortic valve 05/01/2015  . Complication of anesthesia   . PONV (postoperative nausea and vomiting)     nausea  . Environmental allergies   . GERD (gastroesophageal reflux disease)   . S/P thyroidectomy   . Localized tenderness     ANTERIOR NECK DUE TO POST OP THYROIDECTOMY   Assessment: 33 yoM s/p L hip arthroplasty. Chronic Warfarin for mechanical Aortic valve replacement, home dose 10mg  daily exc 11mg  on Tu,Th with last dose 12/24. Lovenox bridging 100mg  SQ q12 from 12/24, last dose 12/29.  Resumed Warfarin evening after surgery, resume Lovenox 100 mg q12h in AM POD#1.  Today's labs, 05/12/2015:  INR subtherapeutic  CBC 12/31: stable, no issues  Enoxaparin 100mg  SQ  BID  No drug-drug interactions  Eating meals  Goal of Therapy:  INR 2-3 (usual goal for mechanical aortic valve) Monitor platelets by anticoagulation protocol: Yes   Plan:   Repeat arfarin 12.5mg  tonight - small booster dose to get INR to begin trending up  Daily PT/INR  Hershal Coria, PharmD, BCPS Pager: [phone] 05/12/2015 10:45 AM

## 2015-05-12 NOTE — Progress Notes (Signed)
Occupational Therapy Treatment Patient Details Name: [name] [name] MRN: [id number] DOB: April 09, 1971 Today's Date: 05/12/2015    History of present illness s/p L THR;  Hx of thyroid ca, CVA and OA bilat hips   OT comments  Pt practiced up to 3in1 with walker today for toilet transfer training. Pt very stiff in L hip today and required increased time for transfer however did improve as session progressed. Also began practice with AE for LB dressing. Will benefit from continued OT education for improved ADL independence.   Follow Up Recommendations  No OT follow up;Supervision/Assistance - 24 hour    Equipment Recommendations  3 in 1 bedside comode    Recommendations for Other Services      Precautions / Restrictions Precautions Precautions: Fall Restrictions Weight Bearing Restrictions: No Other Position/Activity Restrictions: WBAT       Mobility Bed Mobility Overal bed mobility: Needs Assistance Bed Mobility: Supine to Sit     Supine to sit: Min assist     General bed mobility comments: for L LE over to EOB.  Transfers Overall transfer level: Needs assistance Equipment used: Rolling walker (2 wheeled) Transfers: Sit to/from Stand Sit to Stand: Min assist         General transfer comment: min assist initially from EOB. Min guard assist from 3in1. cues for hand placement.    Balance                                   ADL                       Lower Body Dressing: Minimal assistance;Sitting/lateral leans;With adaptive equipment   Toilet Transfer: Minimal assistance;Ambulation;BSC;RW   Toileting- Water quality scientist and Hygiene: Min guard;Sit to/from stand         General ADL Comments: Discussed AE options with pt and wife and he states he would like to obtain AE kit. Practiced with sock aid only today. Would benefit from additional practice prior to d/c. Also worked on toilet transfer to [place] in bathroom with walker. Required  increased time due to being very stiff in L hip today. Discussed tub transfer and educated on tubbench option versus sponge bathing initially and pt states he may just sponge initially. Educated on safety with hand placement and LE management with functional transfers.       Vision                     Perception     Praxis      Cognition   Behavior During Therapy: WFL for tasks assessed/performed Overall Cognitive Status: Within Functional Limits for tasks assessed                       Extremity/Trunk Assessment               Exercises     Shoulder Instructions       General Comments      Pertinent Vitals/ Pain       Pain Assessment: 0-10 Pain Score: 3  Pain Location: L hip Pain Descriptors / Indicators: Tightness;Sore Pain Intervention(s): Repositioned;Ice applied;Monitored during session  Home Living  Prior Functioning/Environment              Frequency Min 2X/week     Progress Toward Goals  OT Goals(current goals can now be found in the care plan section)  Progress towards OT goals: Progressing toward goals     Plan Discharge plan remains appropriate    Co-evaluation                 End of Session Equipment Utilized During Treatment: Rolling walker   Activity Tolerance Other (comment) (L LE stiffness)   Patient Left in chair;with call bell/phone within reach;with family/visitor present   Nurse Communication          Time: 9536-9223 OT Time Calculation (min): 36 min  Charges: OT General Charges $OT Visit: 1 Procedure OT Treatments $Self Care/Home Management : 8-22 mins $Therapeutic Activity: 23-37 mins  [name] [name]  [phone] 05/12/2015, 9:41 AM

## 2015-05-13 LAB — PROTIME-INR
INR: 1.39 (ref 0.00–1.49)
Prothrombin Time: 17.2 seconds — ABNORMAL HIGH (ref 11.6–15.2)

## 2015-05-13 MED ORDER — WARFARIN SODIUM 6 MG PO TABS
12.0000 mg | ORAL_TABLET | Freq: Once | ORAL | Status: DC
  Filled 2015-05-13: qty 2

## 2015-05-13 MED ORDER — METHOCARBAMOL 500 MG PO TABS: 500.0000 mg | ORAL_TABLET | Freq: Four times a day (QID) | ORAL | Status: DC | PRN

## 2015-05-13 MED ORDER — OXYCODONE HCL 5 MG PO TABS: 5.0000 mg | ORAL_TABLET | ORAL | Status: DC | PRN

## 2015-05-13 NOTE — Discharge Instructions (Signed)
INSTRUCTIONS AFTER JOINT REPLACEMENT   o Remove items at home which could result in a fall. This includes throw rugs or furniture in walking pathways o ICE to the affected joint every three hours while awake for 30 minutes at a time, for at least the first 3-5 days, and then as needed for pain and swelling.  Continue to use ice for pain and swelling. You may notice swelling that will progress down to the foot and ankle.  This is normal after surgery.  Elevate your leg when you are not up walking on it.   o Continue to use the breathing machine you got in the hospital (incentive spirometer) which will help keep your temperature down.  It is common for your temperature to cycle up and down following surgery, especially at night when you are not up moving around and exerting yourself.  The breathing machine keeps your lungs expanded and your temperature down.   DIET:  As you were doing prior to hospitalization, we recommend a well-balanced diet.  DRESSING / WOUND CARE / SHOWERING  Keep the surgical dressing until follow up.  The dressing is water proof, so you can shower without any extra covering.  IF THE DRESSING FALLS OFF or the wound gets wet inside, change the dressing with sterile gauze.  Please use good hand washing techniques before changing the dressing.  Do not use any lotions or creams on the incision until instructed by your surgeon.    ACTIVITY  o Increase activity slowly as tolerated, but follow the weight bearing instructions below.   o No driving for 6 weeks or until further direction given by your physician.  You cannot drive while taking narcotics.  o No lifting or carrying greater than 10 lbs. until further directed by your surgeon. o Avoid periods of inactivity such as sitting longer than an hour when not asleep. This helps prevent blood clots.  o You may return to work once you are authorized by your doctor.     WEIGHT BEARING   Weight bearing as tolerated with assist  device (walker, cane, etc) as directed, use it as long as suggested by your surgeon or therapist, typically at least 4-6 weeks.    EXERCISES  Results after joint replacement surgery are often greatly improved when you follow the exercise, range of motion and muscle strengthening exercises prescribed by your doctor. Safety measures are also important to protect the joint from further injury. Any time any of these exercises cause you to have increased pain or swelling, decrease what you are doing until you are comfortable again and then slowly increase them. If you have problems or questions, call your caregiver or physical therapist for advice.   Rehabilitation is important following a joint replacement. After just a few days of immobilization, the muscles of the leg can become weakened and shrink (atrophy).  These exercises are designed to build up the tone and strength of the thigh and leg muscles and to improve motion. Often times heat used for twenty to thirty minutes before working out will loosen up your tissues and help with improving the range of motion but do not use heat for the first two weeks following surgery (sometimes heat can increase post-operative swelling).   These exercises can be done on a training (exercise) mat, on the floor, on a table or on a bed. Use whatever works the best and is most comfortable for you.    Use music or television while you are exercising so  that the exercises are a pleasant break in your day. This will make your life better with the exercises acting as a break in your routine that you can look forward to.   Perform all exercises about fifteen times, three times per day or as directed.  You should exercise both the operative leg and the other leg as well.  Exercises include:    Quad Sets - Tighten up the muscle on the front of the thigh (Quad) and hold for 5-10 seconds.    Straight Leg Raises - With your knee straight (if you were given a brace, keep it  on), lift the leg to 60 degrees, hold for 3 seconds, and slowly lower the leg.  Perform this exercise against resistance later as your leg gets stronger.   Leg Slides: Lying on your back, slowly slide your foot toward your buttocks, bending your knee up off the floor (only go as far as is comfortable). Then slowly slide your foot back down until your leg is flat on the floor again.   Angel Wings: Lying on your back spread your legs to the side as far apart as you can without causing discomfort.   Hamstring Strength:  Lying on your back, push your heel against the floor with your leg straight by tightening up the muscles of your buttocks.  Repeat, but this time bend your knee to a comfortable angle, and push your heel against the floor.  You may put a pillow under the heel to make it more comfortable if necessary.   A rehabilitation program following joint replacement surgery can speed recovery and prevent re-injury in the future due to weakened muscles. Contact your doctor or a physical therapist for more information on knee rehabilitation.    CONSTIPATION  Constipation is defined medically as fewer than three stools per week and severe constipation as less than one stool per week.  Even if you have a regular bowel pattern at home, your normal regimen is likely to be disrupted due to multiple reasons following surgery.  Combination of anesthesia, postoperative narcotics, change in appetite and fluid intake all can affect your bowels.   YOU MUST use at least one of the following options; they are listed in order of increasing strength to get the job done.  They are all available over the counter, and you may need to use some, POSSIBLY even all of these options:    Drink plenty of fluids (prune juice may be helpful) and high fiber foods Colace 100 mg by mouth twice a day  Senokot for constipation as directed and as needed Dulcolax (bisacodyl), take with full glass of water  Miralax (polyethylene  glycol) once or twice a day as needed.  If you have tried all these things and are unable to have a bowel movement in the first 3-4 days after surgery call either your surgeon or your primary doctor.    If you experience loose stools or diarrhea, hold the medications until you stool forms back up.  If your symptoms do not get better within 1 week or if they get worse, check with your doctor.  If you experience "the worst abdominal pain ever" or develop nausea or vomiting, please contact the office immediately for further recommendations for treatment.   ITCHING:  If you experience itching with your medications, try taking only a single pain pill, or even half a pain pill at a time.  You can also use Benadryl over the counter for itching or also  to help with sleep.   TED HOSE STOCKINGS:  Use stockings on both legs until for at least 2 weeks or as directed by physician office. They may be removed at night for sleeping.  MEDICATIONS:  See your medication summary on the After Visit Summary that nursing will review with you.  You may have some home medications which will be placed on hold until you complete the course of blood thinner medication.  It is important for you to complete the blood thinner medication as prescribed.  PRECAUTIONS:  If you experience chest pain or shortness of breath - call 911 immediately for transfer to the hospital emergency department.   If you develop a fever greater that 101 F, purulent drainage from wound, increased redness or drainage from wound, foul odor from the wound/dressing, or calf pain - CONTACT YOUR SURGEON.                                                   FOLLOW-UP APPOINTMENTS:  If you do not already have a post-op appointment, please call the office for an appointment to be seen by your surgeon.  Guidelines for how soon to be seen are listed in your After Visit Summary, but are typically between 1-4 weeks after surgery.  OTHER INSTRUCTIONS:   Knee  Replacement:  Do not place pillow under knee, focus on keeping the knee straight while resting. CPM instructions: 0-90 degrees, 2 hours in the morning, 2 hours in the afternoon, and 2 hours in the evening. Place foam block, curve side up under heel at all times except when in CPM or when walking.  DO NOT modify, tear, cut, or change the foam block in any way.  MAKE SURE YOU:   Understand these instructions.   Get help right away if you are not doing well or get worse.    Thank you for letting us be a part of your medical care team.  It is a privilege we respect greatly.  We hope these instructions will help you stay on track for a fast and full recovery!      CONTINUE YOUR CURRENT LOVENOX DOSAGE TWICE DAILY UNTIL YOUR NEXT BLOOD DRAW TO CHECK YOUR INR.  TAKE 10 MG OF YOUR COUMADIN TONIGHT 05/13/15 AND TOMORROW 05/14/15.

## 2015-05-13 NOTE — Progress Notes (Signed)
ANTICOAGULATION CONSULT NOTE - follow-up Consult  Pharmacy Consult for Warfarin Indication: Aortic Valve replacement/VTE prophyplaxis  No Known Allergies  Patient Measurements: Height: 5\' 10"  (177.8 cm) Weight: 207 lb (93.895 kg) IBW/kg (Calculated) : 73  Vital Signs: Temp: 99 F (37.2 C) (01/02 0555) Temp Source: Oral (01/02 0555) BP: 121/83 mmHg (01/02 0555) Pulse Rate: 84 (01/02 0555)  Labs:  Recent Labs  05/11/15 0503 05/12/15 0542 05/13/15 0531  HGB 12.4*  --   --   HCT 38.4*  --   --   PLT 223  --   --   LABPROT 14.0 14.6 17.2*  INR 1.06 1.13 1.39  CREATININE 0.93  --   --    Estimated Creatinine Clearance: 116.7 mL/min (by C-G formula based on Cr of 0.93).  Medical History: Past Medical History  Diagnosis Date  . Cancer ([place])     thyroid  . Stroke ([place])   . Aortic stenosis   . Long term current use of anticoagulant 12/03/2014  . H/O aortic valve replacement 12/03/2014  . HLD (hyperlipidemia) 01/03/2015  . BP (high blood pressure) 12/19/2014  . Degenerative arthritis of hip 03/17/2011  . 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 Thoracic aortic aneurysm, without rupture ([place]) 05/01/2015  . Incidental pulmonary nodule, > 43mm and < 42mm left lower lobe 05/01/2015  . H/O bicuspid aortic valve 05/01/2015  . Complication of anesthesia   . PONV (postoperative nausea and vomiting)     nausea  . Environmental allergies   . GERD (gastroesophageal reflux disease)   . S/P thyroidectomy   . Localized tenderness     ANTERIOR NECK DUE TO POST OP THYROIDECTOMY   Assessment: 81 yoM s/p L hip arthroplasty. Chronic warfarin for mechanical aortic valve replacement, home dose 10mg  daily exc 11mg  on Tu,Th with last dose 12/24. Lovenox bridging 100mg  SQ q12 from 12/24, last dose 12/29.  Resumed warfarin evening of POD#0, resume Lovenox 100 mg q12h in AM POD#1.  See MAR for inpatient warfarin doses administered  Today, 05/13/2015:  INR beginning to respond after 3  inpatient doses warfarin but not yet approaching therapeutic range.  Enoxaparin 100mg  SQ BID  No bleeding reported in chart notes  No significant drug-drug interactions  Eating meals  Goal of Therapy:  INR 2-3 (usual goal for mechanical aortic valve)    Plan:   Warfarin 12 mg tonight - includes small booster dose  Continue full-dose Lovenox until INR 2.0 or above.   Daily PT/INR while inpatient.  CBC tomorrow unless discharged today.  Clayburn Pert, PharmD, BCPS Pager: [phone] 05/13/2015  7:28 AM

## 2015-05-13 NOTE — Progress Notes (Signed)
Subjective: 3 Days Post-Op Procedure(s) (LRB): LEFT TOTAL HIP ARTHROPLASTY ANTERIOR APPROACH (Left) Patient reports pain as moderate.  Vitals stable.  Patient concerned about INR, but reassurance given that he is covered by hi Lovenox until his INR is therapeutic.  Objective: Vital signs in last 24 hours: Temp:  [98.5 F (36.9 C)-99.7 F (37.6 C)] 99 F (37.2 C) (01/02 0555) Pulse Rate:  [79-88] 84 (01/02 0555) Resp:  [16] 16 (01/02 0555) BP: (115-123)/(72-83) 121/83 mmHg (01/02 0555) SpO2:  [94 %-98 %] 94 % (01/02 0555)  Intake/Output from previous day: 01/01 0701 - 01/02 0700 In: 480 [P.O.:480] Out: -  Intake/Output this shift:     Recent Labs  05/11/15 0503  HGB 12.4*    Recent Labs  05/11/15 0503  WBC 11.6*  RBC 4.17*  HCT 38.4*  PLT 223    Recent Labs  05/11/15 0503  NA 138  K 4.3  CL 102  CO2 27  BUN 17  CREATININE 0.93  GLUCOSE 180*  CALCIUM 7.2*    Recent Labs  05/12/15 0542 05/13/15 0531  INR 1.13 1.39    Sensation intact distally Intact pulses distally Dorsiflexion/Plantar flexion intact Incision: scant drainage No cellulitis present Compartment soft  Assessment/Plan: 3 Days Post-Op Procedure(s) (LRB): LEFT TOTAL HIP ARTHROPLASTY ANTERIOR APPROACH (Left) Up with therapy Discharge home with home health this afternoon.  Saloni Lablanc Y 05/13/2015, 8:58 AM

## 2015-05-13 NOTE — Progress Notes (Signed)
Occupational Therapy Treatment Patient Details Name: [name] [name] MRN: [id number] DOB: 1970-10-06 Today's Date: 05/13/2015    History of present illness s/p L THR;  Hx of thyroid ca, CVA and OA bilat hips   OT comments  Pt progressing well with OT.  Min guard for safety; verbalizes use of all AE  Follow Up Recommendations  No OT follow up;Supervision/Assistance - 24 hour    Equipment Recommendations  None recommended by OT (pt wants to try his standard commode at home without 3:1)    Recommendations for Other Services      Precautions / Restrictions Precautions Precautions: Fall Restrictions Other Position/Activity Restrictions: WBAT       Mobility Bed Mobility         Supine to sit: Min assist     General bed mobility comments: assist for RLE  Transfers   Equipment used: Rolling walker (2 wheeled)   Sit to Stand: Min guard         General transfer comment: for safety    Balance                                   ADL       Grooming: Oral care;Supervision/safety;Standing       Lower Body Bathing: Minimal assistance;Sit to/from stand       Lower Body Dressing: Minimal assistance;Sit to/from stand;With adaptive equipment                 General ADL Comments: pt performed ADL this am.  Stood at sink for peri care and teeth.  Further educated on AE.  Pt has used a Secondary school teacher before:  educated on all ADL uses.  He did not feel he needed additional practice with sock aide.  Simulated long sponge and explained use of reacher/shoehorn for athletic shoes.  Pt has been getting up from comfort height commode, and he wants to try his standard commode at home without 3:1.  He has a tub:  reviewed tub readiness      Vision                     Perception     Praxis      Cognition   Behavior During Therapy: WFL for tasks assessed/performed Overall Cognitive Status: Within Functional Limits for tasks assessed                        Extremity/Trunk Assessment               Exercises     Shoulder Instructions       General Comments      Pertinent Vitals/ Pain       Pain Score: 3  Pain Location: Left hip thigh Pain Descriptors / Indicators: Aching Pain Intervention(s): Limited activity within patient's tolerance;Monitored during session;Premedicated before session;Repositioned;Ice applied  Home Living                                          Prior Functioning/Environment              Frequency       Progress Toward Goals  OT Goals(current goals can now be found in the care plan section)  Progress towards OT goals: Progressing toward goals  Acute Rehab OT  Goals Patient Stated Goal: "ride my horse again"  Plan      Co-evaluation                 End of Session     Activity Tolerance Patient tolerated treatment well   Patient Left in chair;with call bell/phone within reach   Nurse Communication          Time: [id number] OT Time Calculation (min): 37 min  Charges: OT General Charges $OT Visit: 1 Procedure OT Treatments $Self Care/Home Management : 23-37 mins  Detrell Umscheid 05/13/2015, 9:20 AM Lesle Chris, OTR/L [phone] 05/13/2015

## 2015-05-13 NOTE — Progress Notes (Signed)
Physical Therapy Treatment Patient Details Name: [name] [name] MRN: [id number] DOB: Sep 26, 1970 Today's Date: 06-10-15    History of Present Illness s/p L THR;  Hx of thyroid ca, CVA and OA bilat hips    PT Comments    Pt ambulated in hallway and performed LE exercises.  Pt will be seen again for afternoon session to practice steps prior to d/c.  Follow Up Recommendations  Home health PT     Equipment Recommendations  Rolling walker with 5" wheels    Recommendations for Other Services       Precautions / Restrictions Precautions Precautions: Fall Restrictions Other Position/Activity Restrictions: WBAT    Mobility  Bed Mobility Overal bed mobility: Needs Assistance Bed Mobility: Supine to Sit     Supine to sit: Min assist     General bed mobility comments: assist for RLE, educated on self assist however pt preferred PT assist for better pain control  Transfers Overall transfer level: Needs assistance Equipment used: Rolling walker (2 wheeled) Transfers: Sit to/from Stand Sit to Stand: Min guard            Ambulation/Gait Ambulation/Gait assistance: Min guard Ambulation Distance (Feet): 180 Feet Assistive device: Rolling walker (2 wheeled) Gait Pattern/deviations: Step-through pattern;Decreased step length - right;Decreased step length - left;Antalgic Gait velocity: decr   General Gait Details: verbal cues for RW distance, step length, increased time   Stairs            Wheelchair Mobility    Modified Rankin (Stroke Patients Only)       Balance                                    Cognition Arousal/Alertness: Awake/alert Behavior During Therapy: WFL for tasks assessed/performed Overall Cognitive Status: Within Functional Limits for tasks assessed                      Exercises Total Joint Exercises Ankle Circles/Pumps: AROM;Both;15 reps;Supine Quad Sets: AROM;Both;10 reps;Supine Heel Slides: AAROM;Supine;Left;10  reps Hip ABduction/ADduction: Left;15 reps;Supine;AAROM Long [employer]: AROM;Seated;Left;15 reps    General Comments        Pertinent Vitals/Pain Pain Assessment: 0-10 Pain Score: 2  Pain Location: L hip Pain Descriptors / Indicators: Sore;Aching Pain Intervention(s): Limited activity within patient's tolerance;Monitored during session;Premedicated before session;Repositioned    Home Living                      Prior Function            PT Goals (current goals can now be found in the care plan section) Progress towards PT goals: Progressing toward goals    Frequency  7X/week    PT Plan Current plan remains appropriate    Co-evaluation             End of Session Equipment Utilized During Treatment: Gait belt Activity Tolerance: Patient tolerated treatment well Patient left: in chair;with call bell/phone within reach     Time: 1112-1146 PT Time Calculation (min) (ACUTE ONLY): 34 min  Charges:  $Gait Training: 8-22 mins $Therapeutic Exercise: 8-22 mins                    G Codes:      [name] [name],[name] [name] 10-Jun-2015, 1:17 PM [name] [name], PT, DPT 06/10/15 Pager: [phone]

## 2015-05-13 NOTE — Progress Notes (Signed)
Physical Therapy Treatment Note    05/13/15 1500  PT Visit Information  Last PT Received On 05/13/15  Assistance Needed +1  History of Present Illness s/p L THR;  Hx of thyroid ca, CVA and OA bilat hips  PT Time Calculation  PT Start Time (ACUTE ONLY) 1418  PT Stop Time (ACUTE ONLY) 1435  PT Time Calculation (min) (ACUTE ONLY) 17 min  Subjective Data  Subjective Pt ambulated again and practiced safe step with spouse present.  Pt had no further questions and feels ready for d/c home today.  Precautions  Precautions Fall  Restrictions  Other Position/Activity Restrictions WBAT  Pain Assessment  Pain Assessment 0-10  Pain Score 4  Pain Location L hip  Pain Descriptors / Indicators Aching;Sore  Pain Intervention(s) Limited activity within patient's tolerance;Monitored during session;Premedicated before session  Cognition  Arousal/Alertness Awake/alert  Behavior During Therapy WFL for tasks assessed/performed  Overall Cognitive Status Within Functional Limits for tasks assessed  Bed Mobility  General bed mobility comments pt up in recliner  Transfers  Overall transfer level Needs assistance  Equipment used Rolling walker (2 wheeled)  Transfers Sit to/from Stand  Sit to Stand Min guard  Ambulation/Gait  Ambulation/Gait assistance Min guard  Ambulation Distance (Feet) 150 Feet  Assistive device Rolling walker (2 wheeled)  Gait Pattern/deviations Step-through pattern;Decreased step length - right;Decreased stance time - left  General Gait Details verbal cues for RW distance, step length, increased time  Gait velocity decr  Stairs Yes  Stairs assistance Min guard  Stair Management Step to pattern;Backwards;With walker  Number of Stairs 1  General stair comments pt performed one step backwards with RW, spouse present, only required one cue for sequence  PT - End of Session  Activity Tolerance Patient tolerated treatment well  Patient left in chair;with call bell/phone within  reach;with family/visitor present  Nurse Communication Mobility status  PT - Assessment/Plan  PT Plan Current plan remains appropriate  PT Frequency (ACUTE ONLY) 7X/week  Follow Up Recommendations Home health PT  PT equipment Rolling walker with 5" wheels  PT Goal Progression  Progress towards PT goals Progressing toward goals  PT General Charges  $$ ACUTE PT VISIT 1 Procedure  PT Treatments  $Gait Training 8-22 mins   Carmelia Bake, PT, DPT 05/13/2015 Pager: [phone]

## 2015-05-13 NOTE — Discharge Summary (Signed)
Patient ID: [name] [name] MRN: [id number] DOB/AGE: [birth year] 45 y.o.  Admit date: 05/10/2015 Discharge date: 05/13/2015  Admission Diagnoses:  Principal Problem:   Osteoarthritis of left hip Active Problems:   Status post total replacement of left hip   Discharge Diagnoses:  Same  Past Medical History  Diagnosis Date  . Cancer ([place])     thyroid  . Stroke ([place])   . Aortic stenosis   . Long term current use of anticoagulant 12/03/2014  . H/O aortic valve replacement 12/03/2014  . HLD (hyperlipidemia) 01/03/2015  . BP (high blood pressure) 12/19/2014  . Degenerative arthritis of hip 03/17/2011  . 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 Thoracic aortic aneurysm, without rupture ([place]) 05/01/2015  . Incidental pulmonary nodule, > 13mm and < 31mm left lower lobe 05/01/2015  . H/O bicuspid aortic valve 05/01/2015  . Complication of anesthesia   . PONV (postoperative nausea and vomiting)     nausea  . Environmental allergies   . GERD (gastroesophageal reflux disease)   . S/P thyroidectomy   . Localized tenderness     ANTERIOR NECK DUE TO POST OP THYROIDECTOMY    Surgeries: Procedure(s): LEFT TOTAL HIP ARTHROPLASTY ANTERIOR APPROACH on 05/10/2015   Consultants:    Discharged Condition: Improved  Hospital Course: [name] [name] is an 45 y.o. male who was admitted 05/10/2015 for operative treatment ofOsteoarthritis of left hip. Patient has severe unremitting pain that affects sleep, daily activities, and work/hobbies. After pre-op clearance the patient was taken to the operating room on 05/10/2015 and underwent  Procedure(s): LEFT TOTAL HIP ARTHROPLASTY ANTERIOR APPROACH.    Patient was given perioperative antibiotics: Anti-infectives    Start     Dose/Rate Route Frequency Ordered Stop   05/11/15 0600  ceFAZolin (ANCEF) IVPB 2 g/50 mL premix     2 g 100 mL/hr over 30 Minutes Intravenous On call to O.R. 05/10/15 1245 05/10/15 1423   05/10/15 2030  ceFAZolin (ANCEF) IVPB  1 g/50 mL premix     1 g 100 mL/hr over 30 Minutes Intravenous Every 6 hours 05/10/15 1741 05/11/15 0206       Patient was given sequential compression devices, early ambulation, and chemoprophylaxis to prevent DVT.  Patient benefited maximally from hospital stay and there were no complications.    Recent vital signs: Patient Vitals for the past 24 hrs:  BP Temp Temp src Pulse Resp SpO2  05/13/15 0555 121/83 mmHg 99 F (37.2 C) Oral 84 16 94 %  05/12/15 2139 123/72 mmHg 99.7 F (37.6 C) Oral 79 16 98 %  05/12/15 1515 115/79 mmHg 98.5 F (36.9 C) Oral 88 16 95 %     Recent laboratory studies:  Recent Labs  05/11/15 0503 05/12/15 0542 05/13/15 0531  WBC 11.6*  --   --   HGB 12.4*  --   --   HCT 38.4*  --   --   PLT 223  --   --   NA 138  --   --   K 4.3  --   --   CL 102  --   --   CO2 27  --   --   BUN 17  --   --   CREATININE 0.93  --   --   GLUCOSE 180*  --   --   INR 1.06 1.13 1.39  CALCIUM 7.2*  --   --      Discharge Medications:     Medication List    TAKE these  medications        albuterol 108 (90 Base) MCG/ACT inhaler  Commonly known as:  PROVENTIL HFA;VENTOLIN HFA  Inhale 2 puffs into the lungs every 6 (six) hours as needed.     enoxaparin 100 MG/ML injection  Commonly known as:  LOVENOX  Inject 100 mg into the skin.     lisinopril 5 MG tablet  Commonly known as:  PRINIVIL,ZESTRIL  Take 5 mg by mouth daily.     methocarbamol 500 MG tablet  Commonly known as:  ROBAXIN  Take 1 tablet (500 mg total) by mouth every 6 (six) hours as needed for muscle spasms.     oxyCODONE 5 MG immediate release tablet  Commonly known as:  Oxy IR/ROXICODONE  Take 1-2 tablets (5-10 mg total) by mouth every 4 (four) hours as needed for severe pain or breakthrough pain.     ranitidine 150 MG tablet  Commonly known as:  ZANTAC  Take 150 mg by mouth.     SYNTHROID 100 MCG tablet  Generic drug:  levothyroxine  Take 100 mcg by mouth daily before breakfast.      warfarin 1 MG tablet  Commonly known as:  COUMADIN  Take 1 mg by mouth as directed. Take in addition to 10 mg tab on Tues & Thurs for total dose 11 mg     warfarin 10 MG tablet  Commonly known as:  COUMADIN  Take 10 mg by mouth daily. Takes an additional 1 mg on Tues & Thurs for 11 mg total        Diagnostic Studies: Dg C-arm 1-60 Min-no Report  05/10/2015  CLINICAL DATA: surg C-ARM 1-60 MINUTES Fluoroscopy was utilized by the requesting physician.  No radiographic interpretation.   Dg Hip Unilat With Pelvis 1v Left  05/10/2015  CLINICAL DATA:  Left total hip replacement EXAM: DG HIP (WITH OR WITHOUT PELVIS) 1V*L* COMPARISON:  None. FINDINGS: Changes of left hip replacement. Normal AP alignment. No hardware or bony complicating feature. IMPRESSION: Left hip replacement.  No complicating feature. Electronically Signed   By: Rolm Baptise M.D.   On: 05/10/2015 15:49   Dg Hip Port Unilat With Pelvis 1v Left  05/10/2015  CLINICAL DATA:  Status post left total hip arthroplasty EXAM: DG HIP (WITH OR WITHOUT PELVIS) 2V PORT LEFT COMPARISON:  Study obtained intraoperatively earlier in the day FINDINGS: Frontal lower pelvis and lateral left hip images were obtained. There is a total hip prosthesis on the left with prosthetic components appearing well-seated. No acute fracture or dislocation. Right hip joint appears normal. There is mild soft tissue air on the left, an expected postoperative finding. There are surgical clips in the scrotal region bilaterally. IMPRESSION: Total-hip replacement on the left with prosthetic components appearing well-seated. No acute fracture or dislocation. Right hip joint appears unremarkable. Electronically Signed   By: Lowella Grip III M.D.   On: 05/10/2015 16:50    Disposition: TO HOME      Discharge Instructions    Discharge patient    Complete by:  As directed            Follow-up Information    Follow up with [facility].   Why:  Home Health  Physical Therapy   Contact information:   [address] [place] [address] [phone]       Follow up with Mcarthur Rossetti, MD In 2 weeks.   Specialty:  Orthopedic Surgery   Contact information:   [place]  [place] [address] [phone]        Signed: Mcarthur Rossetti 05/13/2015, 9:04 AM

## 2015-05-14 ENCOUNTER — Encounter (HOSPITAL_COMMUNITY): Payer: Self-pay | Admitting: Orthopaedic Surgery

## 2015-06-06 ENCOUNTER — Encounter: Payer: PRIVATE HEALTH INSURANCE | Admitting: Cardiothoracic Surgery

## 2015-06-14 ENCOUNTER — Encounter: Payer: Self-pay | Admitting: *Deleted

## 2015-07-01 ENCOUNTER — Encounter: Payer: Self-pay | Admitting: Cardiothoracic Surgery

## 2015-07-01 ENCOUNTER — Ambulatory Visit (INDEPENDENT_AMBULATORY_CARE_PROVIDER_SITE_OTHER): Payer: PRIVATE HEALTH INSURANCE | Admitting: Cardiothoracic Surgery

## 2015-07-01 VITALS — BP 136/95 | HR 79 | Resp 20 | Ht 70.0 in | Wt 204.0 lb

## 2015-07-01 DIAGNOSIS — R911 Solitary pulmonary nodule: Secondary | ICD-10-CM | POA: Diagnosis not present

## 2015-07-01 DIAGNOSIS — Z954 Presence of other heart-valve replacement: Secondary | ICD-10-CM

## 2015-07-01 DIAGNOSIS — I712 Thoracic aortic aneurysm, without rupture, unspecified: Secondary | ICD-10-CM

## 2015-07-01 DIAGNOSIS — Z8774 Personal history of (corrected) congenital malformations of heart and circulatory system: Secondary | ICD-10-CM

## 2015-07-01 DIAGNOSIS — Z952 Presence of prosthetic heart valve: Secondary | ICD-10-CM

## 2015-07-01 DIAGNOSIS — Z7901 Long term (current) use of anticoagulants: Secondary | ICD-10-CM | POA: Diagnosis not present

## 2015-07-01 DIAGNOSIS — C189 Malignant neoplasm of colon, unspecified: Secondary | ICD-10-CM

## 2015-07-01 MED ORDER — METOPROLOL TARTRATE 25 MG PO TABS: 25.0000 mg | ORAL_TABLET | Freq: Two times a day (BID) | ORAL | Status: DC

## 2015-07-01 NOTE — Progress Notes (Signed)
[address]       [place] [address]             [phone]                    Chuckie Butrum [facility] Medical Record [id number] Date of Birth: 1971-03-12  Referring: Angelia Mould Primary Care: No primary care provider on file. Cardiology: Dr. Jason Fila Endocrinology: Iran Planas MD  Chief Complaint:    Chief Complaint  Patient presents with  . Thoracic Aortic Aneurysm    Further discuss surgery    History of Present Illness:   Patient returns to the office today in follow-up to further discuss the replacement of his ascending aorta. He is recovering well following his left hip replacement. Since he was last seen he is also seen by Dr. Jerelene Redden in [facility] for second opinion regarding surgical repair of his ascending aorta.  He was originally  is seen in the office  because of the incidental discovery of a dilated descending aorta prior to hip replacement.  His cardiac history starts at age 59 when he had critical aortic stenosis and underwent open aortic valve commissurotomy in [place]. Subsequently he presented in September 2003 with critical aortic stenosis and  aortic insufficiency and underwent aortic valve replacement with a #23 St. Jude mechanical valve model #23 AGN- 751, serial [id number]. At the time of surgery Dr. 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 The remainder of theAscendingorta was not noted to be enlarged. The patient has been clinically stable since that time on Coumadin, with the exception of a brief period early postop when he stopped taking his Coumadin and had a minor stroke. 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  In the summer of  2016 he was discovered to have thyroid carcinoma a total thyroidectomy was performed, postop and I 131 scan was done that suggested a lung lesion. In March 18, 2015 CT scan of the chest was performed demonstrating a 5.5 cm dilatation of the ascending aorta. In addition there was a 4 mm left lower lobe lung nodule, too small to characterize.    The patient has been seen by cardiology, Dr. Elonda Husky. The  patient reports that a stress echocardiogram was performed.  The patient has no family history of aortic disease, aortic dissection for aortic aneurysm. His father died in 2023/03/18 of this year at age 61 with on dialysis and with congestive heart failure, his mother is alive at age 83 with congestive heart failure and COPD she did have a myocardial infarction at age 23. He has one brother with no medical history. He denies any history of premature cath or history of aortic aneurysms or dissections in any uncles her cousins.  Current Activity/ Functional Status:  Patient is independent with mobility/ambulation, transfers, ADL's, IADL's.   Zubrod Score: At the time of surgery this patient's most appropriate activity status/level should be described as: [x]     0    Normal activity, no symptoms []     1    Restricted in physical strenuous activity but ambulatory, able to do out light work []     2    Ambulatory and  capable of self care, unable to do work activities, up and about               >50 % of waking hours                              []     3    Only limited self care, in bed greater than 50% of waking hours []     4    Completely disabled, no self care, confined to bed or chair []     5    Moribund   Past Medical History  Diagnosis Date  . Cancer ([place])     thyroid  . Stroke ([place])   . Aortic stenosis   . Long term current use of anticoagulant 12/03/2014  . H/O aortic valve replacement 12/03/2014  . HLD (hyperlipidemia) 01/03/2015  . BP (high blood pressure) 12/19/2014  . Degenerative arthritis of  hip 03/17/2011  . 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 Thoracic aortic aneurysm, without rupture ([place]) 05/01/2015  . Incidental pulmonary nodule, > 2mm and < 69mm left lower lobe 05/01/2015  . H/O bicuspid aortic valve 05/01/2015  . Complication of anesthesia   . PONV (postoperative nausea and vomiting)     nausea  . Environmental allergies   . GERD (gastroesophageal reflux disease)   . S/P thyroidectomy   . Localized tenderness     ANTERIOR NECK DUE TO POST OP THYROIDECTOMY    Past Surgical History  Procedure Laterality Date  . Cardiac valve surgery      AVReplacement 2003. Dr Berline Lopes in [facility]  . Thyroidectomy      12/24/2014, Dr Meredith Leeds [facility]  . Total hip arthroplasty Left 05/10/2015    Procedure: LEFT TOTAL HIP ARTHROPLASTY ANTERIOR APPROACH;  Surgeon: Mcarthur Rossetti, MD;  Location: [facility] ORS;  Service: Orthopedics;  Laterality: Left;    Family History  Problem Relation Age of Onset  . Heart disease Mother     before age 6  . Heart disease Father     before age 9    Social History   Social History  . Marital Status: Married    Spouse Name: N/A  . Number of Children: N/A  . Years of Education: N/A   Occupational History  . Not on file.   Social History Main Topics  . Smoking status: Never Smoker   . Smokeless tobacco: Former Systems developer    Types: Snuff    Quit date: 11/20/2014  . Alcohol Use: No  . Drug Use: No  . Sexual Activity: Not on file   Other Topics Concern  . Not on file   Social History Narrative    History  Smoking status  . Never Smoker   Smokeless tobacco  . Former Systems developer  . Types: Snuff  . Quit date: 11/20/2014    History  Alcohol Use No     No Known Allergies  Current Outpatient Prescriptions  Medication Sig Dispense Refill  . albuterol (PROVENTIL HFA;VENTOLIN HFA) 108 (90 BASE) MCG/ACT inhaler Inhale 2 puffs into the lungs every 6 (six) hours as needed.    Marland Kitchen levothyroxine (SYNTHROID, LEVOTHROID) 175 MCG  tablet Take 175 mcg by mouth daily before breakfast.    . lisinopril (PRINIVIL,ZESTRIL) 5 MG tablet Take 5 mg by mouth daily.    . ranitidine (ZANTAC) 150 MG tablet Take 150 mg by mouth.    . warfarin (COUMADIN) 1 MG  tablet Take 1 mg by mouth as directed. Take in addition to 10 mg tab on Tues & Thurs for total dose 11 mg    . warfarin (COUMADIN) 10 MG tablet Take 10 mg by mouth daily. Takes an additional 1 mg on Tues & Thurs for 11 mg total    . metoprolol tartrate (LOPRESSOR) 25 MG tablet Take 1 tablet (25 mg total) by mouth 2 (two) times daily. 60 tablet 1   No current facility-administered medications for this visit.       Review of Systems:     Cardiac Review of Systems: Y or N  Chest Pain [ N   ]  Resting SOB [ N  ] Exertional SOB  [ N ]  Orthopnea Aqua.Slicker  ]   Pedal Edema [  N ]    Palpitations [ N ] Syncope  [  N]   Presyncope Aqua.Slicker   ]  General Review of Systems: [Y] = yes [  ]=no Constitional: recent weight change Aqua.Slicker  ];  Wt loss over the last 3 months [   ] anorexia [ n ]; fatigue Aqua.Slicker  ]; nausea [  ]; night sweats [  ]; fever [ N ]; or chills Aqua.Slicker  ];          Dental: poor dentition[ N ]; Last Dentist visit:   Eye : blurred vision [  ]; diplopia [   ]; vision changes [  ];  Amaurosis fugax[  ]; Resp: cough [  ];  wheezing[  ];  hemoptysis[  ]; shortness of breath[  ]; paroxysmal nocturnal dyspnea[  ]; dyspnea on exertion[  ]; or orthopnea[  ];  GI:  gallstones[  ], vomiting[  ];  dysphagia[  ]; melena[  ];  hematochezia [  ]; heartburn[  ];   Hx of  Colonoscopy[  ]; GU: kidney stones [  ]; hematuria[  ];   dysuria [  ];  nocturia[  ];  history of     obstruction [  ]; urinary frequency [  ]             Skin: rash, swelling[  ];, hair loss[  ];  peripheral edema[  ];  or itching[  ]; Musculosketetal: myalgias[  ];  joint swelling[  ];  joint erythema[  ];  joint pain[Y  ];  back pain[  ];  Heme/Lymph: bruising[  ];  bleeding[  ];  anemia[  ];  Neuro: TIA[ Y ];  headaches[  ];  stroke[  ];   vertigo[  ];  seizures[  ];   paresthesias[  ];  difficulty walking[  ];  Psych:depression[  ]; anxiety[  ];  Endocrine: diabetes[N  ];  thyroid dysfunction[N  ];  Immunizations: Flu up to date Jazmín.Cullens  ]; Pneumococcal up to date Aqua.Slicker  ];  Other:  Physical Exam: BP 136/95 mmHg  Pulse 79  Resp 20  Ht 5\' 10"  (1.778 m)  Wt 204 lb (92.534 kg)  BMI 29.27 kg/m2  SpO2 96%  PHYSICAL EXAMINATION: General appearance: alert, cooperative, appears stated age and no distress Head: Normocephalic, without obvious abnormality, atraumatic Neck: no adenopathy, no carotid bruit, no JVD, supple, symmetrical, trachea midline and PATIENT'S MIDLINE CERVICAL INCISION FROM HIS THYROIDECTOMIES HEALING WELL SOME SWELLING OF THE NECK GREATER ON THE RIGHT THAN THE LEFT Lymph nodes: Cervical, supraclavicular, and axillary nodes normal. Resp: clear to auscultation bilaterally Back: symmetric, no curvature. ROM normal. No CVA tenderness. Cardio: regular rate and  rhythm, S1, S2 normal, no murmur, click, rub or gallop and VALVE SOUNDS/VALVE CLICK IS CLEAR AND CRISP, NO MURMUR OF AORTIC INSUFFICIENCY GI: soft, non-tender; bowel sounds normal; no masses,  no organomegaly Extremities: extremities normal, atraumatic, no cyanosis or edema and Homans sign is negative, no sign of DVT Neurologic: Grossly normal  Diagnostic Studies & Laboratory data:     Recent Radiology Findings:   CT scan is in a PACS system: No findings specific for metastatic disease in the chest, 4 x 3 mm left lower lobe pulmonary nodule nonspecific, no correlating image finding in the right hemothorax noted on the I-131 scan there is evidence of aortic valve replacement associated 5.5 cm ascending thoracic aortic aneurysm     I have independently reviewed the above radiology studies  and reviewed the findings with the patient.   Recent Lab Findings: Lab Results  Component Value Date   WBC 11.6* 05/11/2015   HGB 12.4* 05/11/2015   HCT 38.4*  05/11/2015   PLT 223 05/11/2015   GLUCOSE 180* 05/11/2015   NA 138 05/11/2015   K 4.3 05/11/2015   CL 102 05/11/2015   CREATININE 0.93 05/11/2015   BUN 17 05/11/2015   CO2 27 05/11/2015   INR 1.39 05/13/2015    Aortic Size Index=     5.5    /Body surface area is 2.14 meters squared. 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 He is aware that he is at increased risk of aortic rupture or dissection because of this dilatation. I reviewed with him that at 5.5 cm in an otherwise suitable risk patient for surgery the recommendation would be to proceed with elective replacement of the ascending aorta and possible root replacement. He will need cardiac catheterization prior to surgical repair, he has an appointment to see Dr. Elonda Husky February 28 to discuss cardiac catheterization. He's doing well enough from his hip replacement that if cardiac catheterization is done in early March by mid to late March he would be ready to proceed with surgical repair of his ascending aorta.  I reviewed with him in detail the signs and symptoms of aortic dissection and the need for immediate care should he have any symptoms.    I've cautioned him about doing any heavy physical work specifically heavy lifting or straining The patient's blood pressure is elevated with a diastolic of 95 in the office today, he has not on a beta blocker, I started him on Lopressor 25 mg by mouth twice a day  He will need  follow-up CT scan in the future for the left lower lobe 4 mm pulmonary nodule   The American College of Cardiology [facility]) and the Industry (AHA) have issued a statement to clarify 2 previous guidelines from the [facility] [employer] [facility], [place], and collaborating societies addressing the risk of aortic dissection in patients with bicuspid aortic valves (BAV) and severe  aortic enlargement. The 2 guidelines differ with regard to the recommended threshold of aortic root or ascending aortic dilatation that would justify surgical intervention in patients with bicuspid aortic valves. This new statement of clarification uses the ACC/AHA revised structure for delineating the Class of Recommendation and Level of Evidence to provide recommendation that replace those contained in Section 9.2.2.1 of the thoracic aortic disease guidelines and Section 5.1.3 of the valvular heart disease guideline. New recommendations in intervention in patients with BAV and dilatation of the aortic root (sinuses) or ascending aorta include:  . Operative intervention to repair or replace the aortic root (sinuses) or replace the ascending aorta is indicated in asymptomatic patients with BAV if the diameter of the aortic root or ascending aorta is 5.5 cm or greater. (Class of recommendation 1, Level of evidence B-NR).  Marland Kitchen Operative intervention to repair or replace the aortic root (sinuses) or replace the ascending aorta is reasonable in asymptomatic patients with BAV if the diameter of the aortic root or ascending aorta is 5.0 cm or greater and an additional risk factor for dissection is present or if the patient is at low surgical risk and the surgery is performed by an experienced aortic surgical team in a center with established expertise in these procedures. (Class of recommendation IIa; Level of Evidence B-NR).  . Replacement of the ascending aorta is reasonable in patients with BAV undergoing AVR because of severe aortic stenosis or aortic  regurgitation when the diameter of the ascending aorta is greater than 4.5 cm (Class of recommendation IIa; Level of evidence C-EO).  Citation: Donnamarie Poag, Randolm Idol, et al. Surgery for aortic dilatation in patients with bicuspid aortic valves. A statement of clarification from the [employer] of Cardiology/American Heart Association Task Force on Clinical Practice Guidelines. [Published online ahead of print April 13, 2014]. Circulation. doi: 10.1161/CIR.0000000000000331.    [name] MD      [address] [place] [address] Office [phone]   Beeper [phone]  07/01/2015 5:13 PM

## 2015-07-18 ENCOUNTER — Encounter: Payer: Self-pay | Admitting: Cardiothoracic Surgery

## 2015-07-18 ENCOUNTER — Ambulatory Visit (INDEPENDENT_AMBULATORY_CARE_PROVIDER_SITE_OTHER): Payer: PRIVATE HEALTH INSURANCE | Admitting: Cardiothoracic Surgery

## 2015-07-18 ENCOUNTER — Encounter: Payer: PRIVATE HEALTH INSURANCE | Admitting: Cardiothoracic Surgery

## 2015-07-18 VITALS — BP 128/88 | HR 60 | Resp 20 | Ht 70.0 in | Wt 202.0 lb

## 2015-07-18 DIAGNOSIS — I712 Thoracic aortic aneurysm, without rupture, unspecified: Secondary | ICD-10-CM

## 2015-07-18 NOTE — Progress Notes (Signed)
[address]       [place] [address]             [phone]                    Neely Fazzino [facility] Medical Record [id number] Date of Birth: Sep 22, 1970  Referring: Angelia Mould Primary Care: No primary care provider on file. Cardiology: Dr. Jason Fila Endocrinology: Iran Planas MD  Chief Complaint:    Chief Complaint  Patient presents with  . Thoracic Aortic Aneurysm    Further discuss surgery, cardiac cath @ [facility] 07/17/2015    History of Present Illness:   Patient returns to the office today in follow-up to further discuss the replacement of his ascending aorta. He is recovering well following his left hip replacement. Since he was last seen he is also seen by Dr. Jerelene Redden in [facility] for second opinion regarding surgical repair of his ascending aorta.  He was originally  is seen in the office  because of the incidental discovery of a dilated descending aorta prior to hip replacement.  His cardiac history starts at age 31 when he had critical aortic stenosis and underwent open aortic valve commissurotomy in [place]. Subsequently he presented in September 2003 with critical aortic stenosis and  aortic insufficiency and underwent aortic valve replacement with a #23 St. Jude mechanical valve model #23 AGN- 751, serial [id number]. At the time of surgery Dr. Berline Lopes in [facility] described also a repair of the pseudoaneurysm of the ascending aorta. 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 The remainder of theAscendingorta was not noted to be enlarged. The patient has been clinically stable since that time on Coumadin, with the exception of a brief period early postop when he stopped taking his Coumadin and had a minor stroke. 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 In 04/08/2015 CT scan of the chest was performed demonstrating a 5.5 cm dilatation of the ascending aorta. In addition there was a 4 mm left lower lobe lung nodule, too small to characterize.    The patient has been seen by cardiology, Dr. Elonda Husky. The  patient reports that a stress echocardiogram was performed.  The patient has no family history of aortic disease, aortic dissection for aortic aneurysm. His father died in 08-Apr-2023 of this year at age 77 with on dialysis and with congestive heart failure, his mother is alive at age 38 with congestive heart failure and COPD she did have a myocardial infarction at age 52. He has one brother with no medical history. He denies any history of premature cath or history of aortic aneurysms or dissections in any uncles her cousins.  He has had hip replacement done and made good recovery ambulating. Cardiac cath done last week in [place], Coronaries were clear.   Current Activity/ Functional Status:  Patient is independent with mobility/ambulation, transfers, ADL's, IADL's.   Zubrod Score: At the time of surgery this patient's most appropriate activity status/level should be described as: [x]     0    Normal activity, no symptoms []   1    Restricted in physical strenuous activity but ambulatory, able to do out light work []     2    Ambulatory and capable of self care, unable to do work activities, up and about               >50 % of waking hours                              []     3    Only limited self care, in bed greater than 50% of waking hours []     4    Completely disabled, no self care, confined to bed or chair []     5    Moribund   Past Medical History  Diagnosis Date  . Cancer ([place])     thyroid  . Stroke ([place])   . Aortic stenosis   . Long term current use of  anticoagulant 12/03/2014  . H/O aortic valve replacement 12/03/2014  . HLD (hyperlipidemia) 01/03/2015  . BP (high blood pressure) 12/19/2014  . Degenerative arthritis of hip 03/17/2011  . 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 Thoracic aortic aneurysm, without rupture ([place]) 05/01/2015  . Incidental pulmonary nodule, > 71mm and < 61mm left lower lobe 05/01/2015  . H/O bicuspid aortic valve 05/01/2015  . Complication of anesthesia   . PONV (postoperative nausea and vomiting)     nausea  . Environmental allergies   . GERD (gastroesophageal reflux disease)   . S/P thyroidectomy   . Localized tenderness     ANTERIOR NECK DUE TO POST OP THYROIDECTOMY    Past Surgical History  Procedure Laterality Date  . Cardiac valve surgery      AVReplacement 2003. Dr Berline Lopes in [facility]  . Thyroidectomy      12/24/2014, Dr Meredith Leeds [facility]  . Total hip arthroplasty Left 05/10/2015    Procedure: LEFT TOTAL HIP ARTHROPLASTY ANTERIOR APPROACH;  Surgeon: Mcarthur Rossetti, MD;  Location: [facility];  Service: Orthopedics;  Laterality: Left;    Family History  Problem Relation Age of Onset  . Heart disease Mother     before age 48  . Heart disease Father     before age 6    Social History   Social History  . Marital Status: Married    Spouse Name: N/A  . Number of Children: N/A  . Years of Education: N/A   Occupational History  . Not on file.   Social History Main Topics  . Smoking status: Never Smoker   . Smokeless tobacco: Former Systems developer    Types: Snuff    Quit date: 11/20/2014  . Alcohol Use: No  . Drug Use: No  . Sexual Activity: Not on file   Other Topics Concern  . Not on file   Social History Narrative    History  Smoking status  . Never Smoker   Smokeless tobacco  . Former Systems developer  . Types: Snuff  . Quit date: 11/20/2014    History  Alcohol Use No     No Known Allergies  Current Outpatient Prescriptions  Medication Sig Dispense Refill  . albuterol  (PROVENTIL HFA;VENTOLIN HFA) 108 (90 BASE) MCG/ACT inhaler Inhale 2 puffs into the lungs every 6 (six) hours as needed.    Marland Kitchen levothyroxine (SYNTHROID, LEVOTHROID) 175 MCG tablet Take 175 mcg by mouth daily before breakfast.    . metoprolol tartrate (LOPRESSOR) 25 MG tablet  Take 1 tablet (25 mg total) by mouth 2 (two) times daily. 60 tablet 1  . ranitidine (ZANTAC) 150 MG tablet Take 150 mg by mouth.    . warfarin (COUMADIN) 1 MG tablet Take 1 mg by mouth as directed. Take in addition to 10 mg tab on Tues & Thurs for total dose 11 mg    . warfarin (COUMADIN) 10 MG tablet Take 10 mg by mouth daily. Takes an additional 1 mg on Tues & Thurs for 11 mg total     No current facility-administered medications for this visit.       Review of Systems:     Cardiac Review of Systems: Y or N  Chest Pain [ N   ]  Resting SOB [ N  ] Exertional SOB  [ N ]  Orthopnea Aqua.Slicker  ]   Pedal Edema [  N ]    Palpitations [ N ] Syncope  [  N]   Presyncope Aqua.Slicker   ]  General Review of Systems: [Y] = yes [  ]=no Constitional: recent weight change Aqua.Slicker  ];  Wt loss over the last 3 months [   ] anorexia [ n ]; fatigue Aqua.Slicker  ]; nausea [  ]; night sweats [  ]; fever [ N ]; or chills Aqua.Slicker  ];          Dental: poor dentition[ N ]; Last Dentist visit:   Eye : blurred vision [  ]; diplopia [   ]; vision changes [  ];  Amaurosis fugax[  ]; Resp: cough [  ];  wheezing[  ];  hemoptysis[  ]; shortness of breath[  ]; paroxysmal nocturnal dyspnea[  ]; dyspnea on exertion[  ]; or orthopnea[  ];  GI:  gallstones[  ], vomiting[  ];  dysphagia[  ]; melena[  ];  hematochezia [  ]; heartburn[  ];   Hx of  Colonoscopy[  ]; GU: kidney stones [  ]; hematuria[  ];   dysuria [  ];  nocturia[  ];  history of     obstruction [  ]; urinary frequency [  ]             Skin: rash, swelling[  ];, hair loss[  ];  peripheral edema[  ];  or itching[  ]; Musculosketetal: myalgias[  ];  joint swelling[  ];  joint erythema[  ];  joint pain[Y  ];  back pain[   ];  Heme/Lymph: bruising[  ];  bleeding[  ];  anemia[  ];  Neuro: TIA[ Y ];  headaches[  ];  stroke[  ];  vertigo[  ];  seizures[  ];   paresthesias[  ];  difficulty walking[  ];  Psych:depression[  ]; anxiety[  ];  Endocrine: diabetes[N  ];  thyroid dysfunction[N  ];  Immunizations: Flu up to date Jazmín.Cullens  ]; Pneumococcal up to date Aqua.Slicker  ];  Other:  Physical Exam: BP 128/88 mmHg  Pulse 60  Resp 20  Ht 5\' 10"  (1.778 m)  Wt 202 lb (91.627 kg)  BMI 28.98 kg/m2  SpO2 98%  PHYSICAL EXAMINATION: General appearance: alert, cooperative, appears stated age and no distress Head: Normocephalic, without obvious abnormality, atraumatic Neck: no adenopathy, no carotid bruit, no JVD, supple, symmetrical, trachea midline and PATIENT'S MIDLINE CERVICAL INCISION FROM HIS THYROIDECTOMIES HEALING WELL SOME SWELLING OF THE NECK GREATER ON THE RIGHT THAN THE LEFT Lymph nodes: Cervical, supraclavicular, and axillary nodes normal. Resp: clear to auscultation bilaterally Back:  symmetric, no curvature. ROM normal. No CVA tenderness. Cardio: regular rate and rhythm, S1, S2 normal, no murmur, click, rub or gallop and VALVE SOUNDS/VALVE CLICK IS CLEAR AND CRISP, NO MURMUR OF AORTIC INSUFFICIENCY GI: soft, non-tender; bowel sounds normal; no masses,  no organomegaly Extremities: extremities normal, atraumatic, no cyanosis or edema and Homans sign is negative, no sign of DVT Neurologic: Grossly normal  Diagnostic Studies & Laboratory data:     Recent Radiology Findings:   CT scan is in a PACS system: No findings specific for metastatic disease in the chest, 4 x 3 mm left lower lobe pulmonary nodule nonspecific, no correlating image finding in the right hemothorax noted on the I-131 scan there is evidence of aortic valve replacement associated 5.5 cm ascending thoracic aortic aneurysm     I have independently reviewed the above radiology studies  and reviewed the findings with the patient.   Recent Lab  Findings: Lab Results  Component Value Date   WBC 11.6* 05/11/2015   HGB 12.4* 05/11/2015   HCT 38.4* 05/11/2015   PLT 223 05/11/2015   GLUCOSE 180* 05/11/2015   NA 138 05/11/2015   K 4.3 05/11/2015   CL 102 05/11/2015   CREATININE 0.93 05/11/2015   BUN 17 05/11/2015   CO2 27 05/11/2015   INR 1.39 05/13/2015    Aortic Size Index=     5.5    /Body surface area is 2.13 meters squared. = 2.55  < 2.75 cm/m2      4% risk per year 2.75 to 4.25          8% risk per year > 4.25 cm/m2    20% risk per year  Cardiac Cath: Procedure  Procedure Type  Diagnostic procedure:  Complications: No Complications.  Conclusions  Diagnostic Procedure Summary  Normal coronary arteries.  Normally functioning bileaflet mechanical AV prosthesis  Prominent thoracic aortic aneurysm noted on aortography  I have reviewed the recent history and physical documentation. I personally  spent 15 minutes continuously monitoring the patient during the administration  of moderate sedation. Pre and post activities have been reviewed. I was  present for the entire procedure.  Diagnostic Procedure Recommendations  Refer to CTS  Signatures  Electronically signed by Baxter Hire, MD, FACC(Diagnostic  Physician) on 07/17/2015 09:01  Angiographic findings  Cardiac Arteries and Lesion Findings  LMCA: Normal.  LAD: Normal.  LCx: Normal.  RCA: Normal.  Ramus: Normal.  Procedure Data  Procedure Date  Date: 07/17/2015 Start: 08:32  Contrast Material  - Omnipaque89 ml  Medical History  Allergies  - NKA allergy.  Admission Data  Admission Date: 07/17/2015  Admission Status: Outpatient.  Hemodynamics  Condition: Rest  Heart Rate: 63 bpm  Pressures  +-----+-------------+  !Site !Pressure !  +-----+-------------+  !AO !191/123 (160)!  +-----+-------------+  !AO !249/149 (158)!  +-----+-------------+  !AO !117/75 (95)!  +-----+-------------+   !AO !107/69 (88)!  +-----+-------------+      Assessment / Plan:    Mid ascending aorta dilated to 5.5 cm in patient with previous history of bicuspid aortic valve and stenosis replaced with a mechanical valve and repair of pseudo-aneurysm of previous aortotomy- 2003 adequate anticoagulation on Coumadin chronically because of mechanical aortic valve prosthesis I have reviewed with the patient and his wife in depth the significance of the finding of dilated ascending aorta to 5.5 cm. He is aware that he is at increased risk of aortic rupture or dissection because of this dilatation. I reviewed with him that at 5.5  cm in an otherwise suitable risk patient for surgery the recommendation would be to proceed with elective replacement of the ascending aorta and possible root replacement. If valve replaced he is agreeble to continuing on coumadin. Use of circulatory arrest has been discussed. Possible need for pacemaker discussed. He has had cardiac catheterization done.  Plan surgery March 27, patient to stop coumadin 5 days before and bridge with lovenox as monitored by coumadin clinic i  [facility] He will need follow-up CT scan in the future for the left lower lobe 4 mm pulmonary nodule   The American College of Cardiology [place]) and the [place] (AHA) have issued a statement to clarify 2 previous guidelines from the [facility], [place], and collaborating societies addressing the risk of aortic dissection in patients with bicuspid aortic valves (BAV) and severe aortic enlargement. The 2 guidelines differ with regard to the recommended threshold of aortic root or ascending aortic dilatation that would justify surgical intervention in patients with bicuspid aortic valves. This new statement of clarification uses the ACC/AHA revised structure for delineating the Class of Recommendation and Level of Evidence to provide recommendation that replace those contained in Section 9.2.2.1 of the thoracic aortic  disease guidelines and Section 5.1.3 of the valvular heart disease guideline. New recommendations in intervention in patients with BAV and dilatation of the aortic root (sinuses) or ascending aorta include:  . Operative intervention to repair or replace the aortic root (sinuses) or replace the ascending aorta is indicated in asymptomatic patients with BAV if the diameter of the aortic root or ascending aorta is 5.5 cm or greater. (Class of recommendation 1, Level of evidence B-NR).  Marland Kitchen Operative intervention to repair or replace the aortic root (sinuses) or replace the ascending aorta is reasonable in asymptomatic patients with BAV if the diameter of the aortic root or ascending aorta is 5.0 cm or greater and an additional risk factor for dissection is present or if the patient is at low surgical risk and the surgery is performed by an experienced aortic surgical team in a center with established expertise in these procedures. (Class of recommendation IIa; Level of Evidence B-NR).  . Replacement of the ascending aorta is reasonable in patients with BAV undergoing AVR because of severe aortic stenosis or aortic regurgitation when the diameter of the ascending aorta is greater than 4.5 cm (Class of recommendation IIa; Level of evidence C-EO).  Citation: Donnamarie Poag, Randolm Idol, et al. Surgery for aortic dilatation in patients with bicuspid aortic valves. A statement of clarification from the [employer] of Cardiology/American Heart Association Task Force on Clinical Practice Guidelines. [Published online ahead of print April 13, 2014]. Circulation. doi: 10.1161/CIR.0000000000000331.    [name] MD      [address] [place] [address] Office [phone]   [place]eeper [phone]  07/18/2015 8:54 PM

## 2015-07-19 ENCOUNTER — Other Ambulatory Visit: Payer: Self-pay | Admitting: *Deleted

## 2015-07-19 DIAGNOSIS — I712 Thoracic aortic aneurysm, without rupture, unspecified: Secondary | ICD-10-CM

## 2015-07-30 DIAGNOSIS — E890 Postprocedural hypothyroidism: Secondary | ICD-10-CM | POA: Insufficient documentation

## 2015-07-31 NOTE — Pre-Procedure Instructions (Signed)
Welcome [name] [name]  07/31/2015      [facility] [address] [name], [name] [address] Phone: [phone] Fax: [phone]  [facility], [place] [address] [place] [address] Phone: [phone] Fax: [phone]    Your procedure is scheduled on Mon, Mar 27 @ 7:30 AM  Report to [name] at 5:30 AM  Call this number if you have problems the morning of surgery:  [phone]   Remember:  Do not eat food or drink liquids after midnight.  Take these medicines the morning of surgery with A SIP OF WATER Albuterol<Bring Your Inhaler With You>,Synthroid(Levothyroxine),and Metoprolol(Lopressor)              Stop Coumadin 5 days prior to surgery-bridging with Lovenox as indicated in Dr.Gerhardt's notes.    Do not wear jewelry.  Do not wear lotions, powders, or colognes.               Men may shave face and neck.  Do not bring valuables to the hospital.  [facility] is not responsible for any belongings or valuables.  Contacts, dentures or bridgework may not be worn into surgery.  Leave your suitcase in the car.  After surgery it may be brought to your room.  For patients admitted to the hospital, discharge time will be determined by your treatment team.  Patients discharged the day of surgery will not be allowed to drive home.    Special instructions: [name] - Preparing for Surgery  Before surgery, you can play an important role.  Because skin is not sterile, your skin needs to be as free of germs as possible.  You can reduce the number of germs on you skin by washing with CHG (chlorahexidine gluconate) soap before surgery.  CHG is an antiseptic cleaner which kills germs and bonds with the skin to continue killing germs even after washing.  Please DO NOT use if you have an allergy to CHG or antibacterial soaps.  If your skin  becomes reddened/irritated stop using the CHG and inform your nurse when you arrive at Short Stay.  Do not shave (including legs and underarms) for at least 48 hours prior to the first CHG shower.  You may shave your face.  Please follow these instructions carefully:   1.  Shower with CHG Soap the night before surgery and the                                morning of Surgery.  2.  If you choose to wash your hair, wash your hair first as usual with your       normal shampoo.  3.  After you shampoo, rinse your hair and body thoroughly to remove the                      Shampoo.  4.  Use CHG as you would any other liquid soap.  You can apply chg directly       to the skin and wash gently with scrungie or a clean washcloth.  5.  Apply the CHG Soap to your body ONLY FROM THE NECK DOWN.        Do not use on open wounds or open sores.  Avoid contact with your eyes,  ears, mouth and genitals (private parts).  Wash genitals (private parts)       with your normal soap.  6.  Wash thoroughly, paying special attention to the area where your surgery        will be performed.  7.  Thoroughly rinse your body with warm water from the neck down.  8.  DO NOT shower/wash with your normal soap after using and rinsing off       the CHG Soap.  9.  Pat yourself dry with a clean towel.            10.  Wear clean pajamas.            11.  Place clean sheets on your bed the night of your first shower and do not        sleep with pets.  Day of Surgery  Do not apply any lotions/deoderants the morning of surgery.  Please wear clean clothes to the hospital/surgery center.    Please read over the following fact sheets that you were given. Pain Booklet, Coughing and Deep Breathing, Blood Transfusion Information, MRSA Information and Surgical Site Infection Prevention

## 2015-08-01 ENCOUNTER — Ambulatory Visit (HOSPITAL_COMMUNITY)
Admission: RE | Admit: 2015-08-01 | Discharge: 2015-08-01 | Disposition: A | Discharge status: Home / Self Care | Payer: PRIVATE HEALTH INSURANCE | Source: Ambulatory Visit | Attending: Cardiothoracic Surgery | Admitting: Cardiothoracic Surgery

## 2015-08-01 ENCOUNTER — Encounter: Payer: Self-pay | Admitting: Cardiothoracic Surgery

## 2015-08-01 ENCOUNTER — Ambulatory Visit (HOSPITAL_BASED_OUTPATIENT_CLINIC_OR_DEPARTMENT_OTHER)
Admission: RE | Admit: 2015-08-01 | Discharge: 2015-08-01 | Disposition: A | Discharge status: Home / Self Care | Payer: PRIVATE HEALTH INSURANCE | Source: Ambulatory Visit | Attending: Cardiothoracic Surgery | Admitting: Cardiothoracic Surgery

## 2015-08-01 ENCOUNTER — Ambulatory Visit (INDEPENDENT_AMBULATORY_CARE_PROVIDER_SITE_OTHER): Payer: PRIVATE HEALTH INSURANCE | Admitting: Cardiothoracic Surgery

## 2015-08-01 ENCOUNTER — Encounter (HOSPITAL_COMMUNITY)
Admission: RE | Admit: 2015-08-01 | Discharge: 2015-08-01 | Disposition: A | Discharge status: Home / Self Care | Payer: PRIVATE HEALTH INSURANCE | Source: Ambulatory Visit | Attending: Cardiothoracic Surgery | Admitting: Cardiothoracic Surgery

## 2015-08-01 ENCOUNTER — Encounter (HOSPITAL_COMMUNITY): Payer: Self-pay

## 2015-08-01 VITALS — BP 140/90 | HR 80 | Resp 20 | Ht 70.0 in | Wt 209.0 lb

## 2015-08-01 VITALS — BP 146/86 | HR 70 | Temp 98.8°F | Resp 20 | Ht 70.0 in | Wt 207.6 lb

## 2015-08-01 DIAGNOSIS — R911 Solitary pulmonary nodule: Secondary | ICD-10-CM

## 2015-08-01 DIAGNOSIS — Z7901 Long term (current) use of anticoagulants: Secondary | ICD-10-CM

## 2015-08-01 DIAGNOSIS — Z952 Presence of prosthetic heart valve: Secondary | ICD-10-CM

## 2015-08-01 DIAGNOSIS — Z954 Presence of other heart-valve replacement: Secondary | ICD-10-CM

## 2015-08-01 DIAGNOSIS — I712 Thoracic aortic aneurysm, without rupture, unspecified: Secondary | ICD-10-CM

## 2015-08-01 DIAGNOSIS — Z01818 Encounter for other preprocedural examination: Secondary | ICD-10-CM | POA: Insufficient documentation

## 2015-08-01 DIAGNOSIS — C189 Malignant neoplasm of colon, unspecified: Secondary | ICD-10-CM

## 2015-08-01 DIAGNOSIS — J984 Other disorders of lung: Secondary | ICD-10-CM | POA: Insufficient documentation

## 2015-08-01 DIAGNOSIS — Z8774 Personal history of (corrected) congenital malformations of heart and circulatory system: Secondary | ICD-10-CM

## 2015-08-01 DIAGNOSIS — Z01812 Encounter for preprocedural laboratory examination: Secondary | ICD-10-CM | POA: Insufficient documentation

## 2015-08-01 DIAGNOSIS — Z0183 Encounter for blood typing: Secondary | ICD-10-CM | POA: Diagnosis not present

## 2015-08-01 HISTORY — DX: Personal history of other diseases of the respiratory system: Z87.09

## 2015-08-01 HISTORY — DX: Dizziness and giddiness: R42

## 2015-08-01 HISTORY — DX: Hypothyroidism, unspecified: E03.9

## 2015-08-01 HISTORY — DX: Transient cerebral ischemic attack, unspecified: G45.9

## 2015-08-01 LAB — COMPREHENSIVE METABOLIC PANEL
ALT: 15 U/L — ABNORMAL LOW (ref 17–63)
AST: 24 U/L (ref 15–41)
Albumin: 3.8 g/dL (ref 3.5–5.0)
Alkaline Phosphatase: 47 U/L (ref 38–126)
Anion gap: 9 (ref 5–15)
BUN: 12 mg/dL (ref 6–20)
CO2: 22 mmol/L (ref 22–32)
Calcium: 7.7 mg/dL — ABNORMAL LOW (ref 8.9–10.3)
Chloride: 108 mmol/L (ref 101–111)
Creatinine, Ser: 0.86 mg/dL (ref 0.61–1.24)
GFR calc Af Amer: >60 mL/min (ref >60)
GFR calc non Af Amer: >60 mL/min (ref >60)
Glucose, Bld: 113 mg/dL — ABNORMAL HIGH (ref 65–99)
Potassium: 3.9 mmol/L (ref 3.5–5.1)
Sodium: 139 mmol/L (ref 135–145)
Total Bilirubin: 0.7 mg/dL (ref 0.3–1.2)
Total Protein: 6.5 g/dL (ref 6.5–8.1)

## 2015-08-01 LAB — URINALYSIS, ROUTINE W REFLEX MICROSCOPIC
Bilirubin Urine: NEGATIVE
Glucose, UA: NEGATIVE mg/dL
Ketones, ur: NEGATIVE mg/dL
Leukocytes, UA: NEGATIVE
Nitrite: NEGATIVE
Protein, ur: NEGATIVE mg/dL
Specific Gravity, Urine: 1.022 (ref 1.005–1.030)
pH: 6 (ref 5.0–8.0)

## 2015-08-01 LAB — BLOOD GAS, ARTERIAL
Acid-Base Excess: 1.3 mmol/L (ref 0.0–2.0)
Bicarbonate: 25 mEq/L — ABNORMAL HIGH (ref 20.0–24.0)
Drawn by: 449841
FIO2: 0.21
O2 Saturation: 98.7 %
Patient temperature: 98.6
TCO2: 26.2 mmol/L (ref 0–100)
pCO2 arterial: 37.5 mmHg (ref 35.0–45.0)
pH, Arterial: 7.439 (ref 7.350–7.450)
pO2, Arterial: 126 mmHg — ABNORMAL HIGH (ref 80.0–100.0)

## 2015-08-01 LAB — PULMONARY FUNCTION TEST
DL/VA % pred: 112 %
DL/VA: 5.23 ml/min/mmHg/L
DLCO cor % pred: 97 %
DLCO cor: 31.46 ml/min/mmHg
DLCO unc % pred: 94 %
DLCO unc: 30.54 ml/min/mmHg
FEF 25-75 Post: 3.65 L/sec
FEF 25-75 Pre: 3.33 L/sec
FEF2575-%Change-Post: 9 %
FEF2575-%Pred-Post: 97 %
FEF2575-%Pred-Pre: 88 %
FEV1-%Change-Post: 3 %
FEV1-%Pred-Post: 88 %
FEV1-%Pred-Pre: 85 %
FEV1-Post: 3.6 L
FEV1-Pre: 3.48 L
FEV1FVC-%Change-Post: 3 %
FEV1FVC-%Pred-Pre: 102 %
FEV6-%Change-Post: 0 %
FEV6-%Pred-Post: 86 %
FEV6-%Pred-Pre: 85 %
FEV6-Post: 4.34 L
FEV6-Pre: 4.32 L
FEV6FVC-%Pred-Post: 103 %
FEV6FVC-%Pred-Pre: 103 %
FVC-%Change-Post: 0 %
FVC-%Pred-Post: 83 %
FVC-%Pred-Pre: 83 %
FVC-Post: 4.34 L
FVC-Pre: 4.32 L
Post FEV1/FVC ratio: 83 %
Post FEV6/FVC ratio: 100 %
Pre FEV1/FVC ratio: 81 %
Pre FEV6/FVC Ratio: 100 %
RV % pred: 76 %
RV: 1.48 L
TLC % pred: 84 %
TLC: 5.83 L

## 2015-08-01 LAB — PROTIME-INR
INR: 2.2 — ABNORMAL HIGH (ref 0.00–1.49)
Prothrombin Time: 24.2 seconds — ABNORMAL HIGH (ref 11.6–15.2)

## 2015-08-01 LAB — CBC
HCT: 40.5 % (ref 39.0–52.0)
Hemoglobin: 13.6 g/dL (ref 13.0–17.0)
MCH: 28.5 pg (ref 26.0–34.0)
MCHC: 33.6 g/dL (ref 30.0–36.0)
MCV: 84.9 fL (ref 78.0–100.0)
Platelets: 247 10*3/uL (ref 150–400)
RBC: 4.77 MIL/uL (ref 4.22–5.81)
RDW: 12.8 % (ref 11.5–15.5)
WBC: 5.1 10*3/uL (ref 4.0–10.5)

## 2015-08-01 LAB — URINE MICROSCOPIC-ADD ON: Squamous Epithelial / LPF: NONE SEEN

## 2015-08-01 LAB — ABO/RH: ABO/RH(D): A NEG

## 2015-08-01 LAB — SURGICAL PCR SCREEN
MRSA, PCR: NEGATIVE
Staphylococcus aureus: NEGATIVE

## 2015-08-01 LAB — APTT: aPTT: 66 seconds — ABNORMAL HIGH (ref 24–37)

## 2015-08-01 MED ORDER — CHLORHEXIDINE GLUCONATE 4 % EX LIQD: 30.0000 mL | CUTANEOUS | Status: DC

## 2015-08-01 MED ORDER — ALBUTEROL SULFATE (2.5 MG/3ML) 0.083% IN NEBU
2.5000 mg | INHALATION_SOLUTION | Freq: Once | RESPIRATORY_TRACT | Status: AC
  Administered 2015-08-01: 2.5 mg via RESPIRATORY_TRACT

## 2015-08-01 NOTE — Progress Notes (Signed)
[address]       [place] [address]             [phone]                    [name] [name] [place] Medical Record [id number] Date of Birth: [birth year]  Referring: Angelia Mould Primary Care: No PCP Per Patient Cardiology: Dr. Jason Fila Endocrinology: Iran Planas MD  Chief Complaint:    Chief Complaint  Patient presents with  . Thoracic Aortic Aneurysm    Further discuss surgery scheduled for 08/05/15 with CXR  . Lung Lesion    History of Present Illness:   Patient returns to the office today in follow-up to further discuss the replacement of his ascending aorta. He is recovering well following his left hip replacement. Since he was last seen he is also seen by Dr. Jerelene Redden in [facility] for second opinion regarding surgical repair of his ascending aorta.  He was originally  is seen in the office  because of the incidental discovery of a dilated descending aorta prior to hip replacement.  His cardiac history starts at age 30 when he had critical aortic stenosis and underwent open aortic valve commissurotomy in [place]. Subsequently he presented in September 2003 with critical aortic stenosis and  aortic insufficiency and underwent aortic valve replacement with a #23 St. Jude mechanical valve model #23 AGN- 751, serial [id number]. At the time of surgery Dr. Berline Lopes in [facility] described also a repair of the pseudoaneurysm of the ascending aorta. 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 The remainder of theAscendingorta was not noted to be enlarged. The patient has been clinically stable since that time on Coumadin, with the exception of a brief period early postop when he stopped taking his Coumadin and had a minor stroke. 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 In 2015-04-02 CT scan of the chest was performed demonstrating a 5.5 cm dilatation of the ascending aorta. In addition there was a 4 mm left lower lobe lung nodule, too small to characterize.    The patient has been seen by cardiology, Dr. Elonda Husky. The  patient reports that a stress echocardiogram was performed.  The patient has no family history of aortic disease, aortic dissection for aortic aneurysm. His father died in 2023/04/02 of this year at age 67 with on dialysis and with congestive heart failure, his mother is alive at age 56 with congestive heart failure and COPD she did have a myocardial infarction at age 53. He has one brother with no medical history. He denies any history of premature cath or history of aortic aneurysms or dissections in any uncles her cousins.  He has had hip replacement done and made good recovery ambulating. Cardiac cath done last week in [place], Coronaries were clear.   Current Activity/ Functional Status:  Patient is independent with mobility/ambulation, transfers, ADL's, IADL's.   Zubrod Score: At the time of surgery this patient's most appropriate activity status/level should be described as: [x]     0    Normal activity, no symptoms []   1    Restricted in physical strenuous activity but ambulatory, able to do out light work []     2    Ambulatory and capable of self care, unable to do work activities, up and about               >50 % of waking hours                              []     3    Only limited self care, in bed greater than 50% of waking hours []     4    Completely disabled, no self care, confined to bed or chair []     5    Moribund   Past Medical History  Diagnosis Date  . Cancer ([place])     thyroid  . Aortic stenosis   . Long term current use of anticoagulant  12/03/2014  . H/O aortic valve replacement 12/03/2014  . HLD (hyperlipidemia)     was on Crestor but has been off for months(oct 2016)  . Degenerative arthritis of hip 03/17/2011  . 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 Thoracic aortic aneurysm, without rupture ([place]) 05/01/2015  . Incidental pulmonary nodule, > 30mm and < 28mm left lower lobe 05/01/2015  . H/O bicuspid aortic valve 05/01/2015  . Complication of anesthesia   . PONV (postoperative nausea and vomiting)     nausea  . S/P thyroidectomy   . BP (high blood pressure) 12/19/2014    takes Lisinopril and Metoprolol daily  . Hypothyroidism     takes Synthroid daily  . GERD (gastroesophageal reflux disease)     takes Zantac daily  . Environmental allergies     Albuterol inhaler as needed  . History of bronchitis   . Vertigo     d//t inner ear  . TIA (transient ischemic attack)     Past Surgical History  Procedure Laterality Date  . Cardiac valve surgery  at age 45/at age 45    AVReplacement 2003. Dr Berline Lopes in [facility]  . Thyroidectomy      12/24/2014, Dr Meredith [facility]  . Total hip arthroplasty Left 05/10/2015    Procedure: LEFT TOTAL HIP ARTHROPLASTY ANTERIOR APPROACH;  Surgeon: Mcarthur Rossetti, MD;  Location: [facility] ORS;  Service: Orthopedics;  Laterality: Left;    Family History  Problem Relation Age of Onset  . Heart disease Mother     before age 101  . Heart disease Father     before age 66    Social History   Social History  . Marital Status: Married    Spouse Name: N/A  . Number of Children: N/A  . Years of Education: N/A   Occupational History  . Not on file.   Social History Main Topics  . Smoking status: Never Smoker   . Smokeless tobacco: Not on file  . Alcohol Use: No  . Drug Use: No  . Sexual Activity: Yes   Other Topics Concern  . Not on file   Social History Narrative    History  Smoking status  . Never Smoker   Smokeless tobacco  . Not on file    History    Alcohol Use No     No Known Allergies  Current Outpatient Prescriptions  Medication Sig Dispense Refill  . albuterol (PROVENTIL HFA;VENTOLIN HFA) 108 (90 BASE) MCG/ACT inhaler Inhale 2 puffs into the lungs every 6 (  six) hours as needed for wheezing or shortness of breath.     . enoxaparin (LOVENOX) 100 MG/ML injection Inject 100 mg into the skin every 12 (twelve) hours.    Marland Kitchen levothyroxine (SYNTHROID, LEVOTHROID) 175 MCG tablet Take 175 mcg by mouth daily before breakfast.    . lisinopril (PRINIVIL,ZESTRIL) 5 MG tablet Take 5 mg by mouth daily.    . metoprolol tartrate (LOPRESSOR) 25 MG tablet Take 1 tablet (25 mg total) by mouth 2 (two) times daily. 60 tablet 1  . ranitidine (ZANTAC) 150 MG tablet Take 150 mg by mouth every evening.     . warfarin (COUMADIN) 1 MG tablet Take 1 mg by mouth as directed. Reported on 08/01/2015    . warfarin (COUMADIN) 10 MG tablet Take 10 mg by mouth daily. Reported on 08/01/2015     No current facility-administered medications for this visit.   Facility-Administered Medications Ordered in Other Visits  Medication Dose Route Frequency Provider Last Rate Last Dose  . chlorhexidine (HIBICLENS) 4 % liquid 2 application  30 mL Topical UD [name], MD           Review of Systems:     Cardiac Review of Systems: Y or N  Chest Pain [ N   ]  Resting SOB [ N  ] Exertional SOB  [ N ]  Orthopnea Aqua.Slicker  ]   Pedal Edema [  N ]    Palpitations [ N ] Syncope  [  N]   Presyncope Aqua.Slicker   ]  General Review of Systems: [Y] = yes [  ]=no Constitional: recent weight change Aqua.Slicker  ];  Wt loss over the last 3 months [   ] anorexia [ n ]; fatigue Aqua.Slicker  ]; nausea [  ]; night sweats [  ]; fever [ N ]; or chills Aqua.Slicker  ];          Dental: poor dentition[ N ]; Last Dentist visit:   Eye : blurred vision [  ]; diplopia [   ]; vision changes [  ];  Amaurosis fugax[  ]; Resp: cough [  ];  wheezing[  ];  hemoptysis[  ]; shortness of breath[  ]; paroxysmal nocturnal dyspnea[  ]; dyspnea on  exertion[  ]; or orthopnea[  ];  GI:  gallstones[  ], vomiting[  ];  dysphagia[  ]; melena[  ];  hematochezia [  ]; heartburn[  ];   Hx of  Colonoscopy[  ]; GU: kidney stones [  ]; hematuria[  ];   dysuria [  ];  nocturia[  ];  history of     obstruction [  ]; urinary frequency [  ]             Skin: rash, swelling[  ];, hair loss[  ];  peripheral edema[  ];  or itching[  ]; Musculosketetal: myalgias[  ];  joint swelling[  ];  joint erythema[  ];  joint pain[Y  ];  back pain[  ];  Heme/Lymph: bruising[  ];  bleeding[  ];  anemia[  ];  Neuro: TIA[ Y ];  headaches[  ];  stroke[  ];  vertigo[  ];  seizures[  ];   paresthesias[  ];  difficulty walking[  ];  Psych:depression[  ]; anxiety[  ];  Endocrine: diabetes[N  ];  thyroid dysfunction[N  ];  Immunizations: Flu up to date Jazmín.Cullens  ]; Pneumococcal up to date Aqua.Slicker  ];  Other:  Physical Exam: BP 140/90  mmHg  Pulse 80  Resp 20  Ht 5\' 10"  (1.778 m)  Wt 209 lb (94.802 kg)  BMI 29.99 kg/m2  SpO2 98%  PHYSICAL EXAMINATION: General appearance: alert, cooperative, appears stated age and no distress Head: Normocephalic, without obvious abnormality, atraumatic Neck: no adenopathy, no carotid bruit, no JVD, supple, symmetrical, trachea midline and PATIENT'S MIDLINE CERVICAL INCISION FROM HIS THYROIDECTOMIES HEALING WELL SOME SWELLING OF THE NECK GREATER ON THE RIGHT THAN THE LEFT Lymph nodes: Cervical, supraclavicular, and axillary nodes normal. Resp: clear to auscultation bilaterally Back: symmetric, no curvature. ROM normal. No CVA tenderness. Cardio: regular rate and rhythm, S1, S2 normal, no murmur, click, rub or gallop and VALVE SOUNDS/VALVE CLICK IS CLEAR AND CRISP, NO MURMUR OF AORTIC INSUFFICIENCY GI: soft, non-tender; bowel sounds normal; no masses,  no organomegaly Extremities: extremities normal, atraumatic, no cyanosis or edema and Homans sign is negative, no sign of DVT Neurologic: Grossly normal  Diagnostic Studies & Laboratory data:      Recent Radiology Findings:   CT scan is in a PACS system: No findings specific for metastatic disease in the chest, 4 x 3 mm left lower lobe pulmonary nodule nonspecific, no correlating image finding in the right hemothorax noted on the I-131 scan there is evidence of aortic valve replacement associated 5.5 cm ascending thoracic aortic aneurysm     I have independently reviewed the above radiology studies  and reviewed the findings with the patient.   Recent Lab Findings: Lab Results  Component Value Date   WBC 5.1 08/01/2015   HGB 13.6 08/01/2015   HCT 40.5 08/01/2015   PLT 247 08/01/2015   GLUCOSE 113* 08/01/2015   ALT 15* 08/01/2015   AST 24 08/01/2015   NA 139 08/01/2015   K 3.9 08/01/2015   CL 108 08/01/2015   CREATININE 0.86 08/01/2015   BUN 12 08/01/2015   CO2 22 08/01/2015   INR 2.20* 08/01/2015    Aortic Size Index=     5.5    /Body surface area is 2.16 meters squared. = 2.55  < 2.75 cm/m2      4% risk per year 2.75 to 4.25          8% risk per year > 4.25 cm/m2    20% risk per year  Cardiac Cath: Procedure  Procedure Type  Diagnostic procedure:  Complications: No Complications.  Conclusions  Diagnostic Procedure Summary  Normal coronary arteries.  Normally functioning bileaflet mechanical AV prosthesis  Prominent thoracic aortic aneurysm noted on aortography  I have reviewed the recent history and physical documentation. I personally  spent 15 minutes continuously monitoring the patient during the administration  of moderate sedation. Pre and post activities have been reviewed. I was  present for the entire procedure.  Diagnostic Procedure Recommendations  Refer to CTS  Signatures  Electronically signed by Baxter Hire, MD, FACC(Diagnostic  Physician) on 07/17/2015 09:01  Angiographic findings  Cardiac Arteries and Lesion Findings  LMCA: Normal.  LAD: Normal.  LCx: Normal.  RCA: Normal.  Ramus: Normal.  Procedure Data    Procedure Date  Date: 07/17/2015 Start: 08:32  Contrast Material  - Omnipaque89 ml  Medical History  Allergies  - NKA allergy.  Admission Data  Admission Date: 07/17/2015  Admission Status: Outpatient.  Hemodynamics  Condition: Rest  Heart Rate: 63 bpm  Pressures  +-----+-------------+  !Site !Pressure !  +-----+-------------+  !AO !191/123 (160)!  +-----+-------------+  !AO !249/149 (158)!  +-----+-------------+  !AO !117/75 (95)!  +-----+-------------+  !AO !107/69 (  34)!  +-----+-------------+      Assessment / Plan:    Mid ascending aorta dilated to 5.5 cm in patient with previous history of bicuspid aortic valve and stenosis replaced with a mechanical valve and repair of pseudo-aneurysm of previous aortotomy- 2003 adequate anticoagulation on Coumadin chronically because of mechanical aortic valve prosthesis I have reviewed with the patient and his wife in depth the significance of the finding of dilated ascending aorta to 5.5 cm. He is aware that he is at increased risk of aortic rupture or dissection because of this dilatation. I reviewed with him that at 5.5 cm in an otherwise suitable risk patient for surgery the recommendation would be to proceed with elective replacement of the ascending aorta and possible root replacement. If valve replaced he is agreeble to continuing on coumadin. Use of circulatory arrest has been discussed. Possible need for pacemaker discussed. He has had cardiac catheterization done.  Plan surgery March 27, patient to stop coumadin 5 days before and bridge with lovenox as monitored by coumadin clinic in   [place] He will need follow-up CT scan in the future for the left lower lobe 4 mm pulmonary nodule   The goals risks and alternatives of the planned surgical procedure Redo sternotomy reverse with replacement of ascending aorta and possible root replacement and possible circulatory arrest have been  discussed with the patient in detail. The risks of the procedure including death, infection, stroke, myocardial infarction, bleeding, heart block and need for permanent pacemaker,  blood transfusion have all been discussed specifically.  I have quoted Colin Mulders a 6 % of perioperative mortality and a complication rate as high as 40 %. The patient's questions have been answered.Marcellus Musumeci is willing  to proceed with the planned procedure.  In addition to other potential risks and complications from the surgery, I have made the patient aware of the recent [place] concerning the risk of infection by Myocobacterium chimaera related to the use of Stockert 3T heater-cooler equipment during cardiac surgery. I discussed with the patient the low risk of infection, as well as our compliance with the most current FDA recommendations to minimize infection and testing of all devices for contamination. The patient has been made aware of the limited alternatives to immediately replacing the current equipment. The patient has been informed regarding the risks associated with waiting to proceed with needed surgery and that such risks are greater than the risk of infection related to the use of the heater-cooler device. I did make the patient aware that after careful review of the patients having cardiac surgery at [facility] we have no evidence that heater/cooler related infections have occurred at [facility]. We discussed that this is a slow-growing bacterium, such that it can take some period of time for symptoms to develop.    The [employer] [facility]) and the [facility]) have issued a statement to clarify 2 previous guidelines from the [facility], [place], and collaborating societies addressing the risk of aortic dissection in patients with bicuspid aortic valves (BAV) and severe aortic enlargement. The 2 guidelines differ with regard to the recommended threshold of aortic root  or ascending aortic dilatation that would justify surgical intervention in patients with bicuspid aortic valves. This new statement of clarification uses the ACC/AHA revised structure for delineating the Class of Recommendation and Level of Evidence to provide recommendation that replace those contained in Section 9.2.2.1 of the thoracic aortic disease guidelines and Section 5.1.3 of the valvular  heart disease guideline. New recommendations in intervention in patients with BAV and dilatation of the aortic root (sinuses) or ascending aorta include:  . Operative intervention to repair or replace the aortic root (sinuses) or replace the ascending aorta is indicated in asymptomatic patients with BAV if the diameter of the aortic root or ascending aorta is 5.5 cm or greater. (Class of recommendation 1, Level of evidence B-NR).  Marland Kitchen Operative intervention to repair or replace the aortic root (sinuses) or replace the ascending aorta is reasonable in asymptomatic patients with BAV if the diameter of the aortic root or ascending aorta is 5.0 cm or greater and an additional risk factor for dissection is present or if the patient is at low surgical risk and the surgery is performed by an experienced aortic surgical team in a center with established expertise in these procedures. (Class of recommendation IIa; Level of Evidence B-NR).  . Replacement of the ascending aorta is reasonable in patients with BAV undergoing AVR because of severe aortic stenosis or aortic regurgitation when the diameter of the ascending aorta is greater than 4.5 cm (Class of recommendation IIa; Level of evidence C-EO).  Citation: Donnamarie Poag, Randolm Idol, et al. Surgery for aortic dilatation in patients with bicuspid aortic valves. A statement of clarification from the [employer]/American Heart Association Task Force on Clinical Practice Guidelines. [Published online ahead of print April 13, 2014]. Circulation.  doi: 10.1161/CIR.0000000000000331.    [name] MD      [address] [place] [address] Office [phone]   Beeper [phone]  08/01/2015 12:11 PM

## 2015-08-01 NOTE — Progress Notes (Addendum)
PT and wife request regardless of PT/INR results from PAT appointment they want it repeated DOS,per Dr.Gerhardt that is fine

## 2015-08-01 NOTE — Progress Notes (Addendum)
Cardiologist is. Dr.Zan Tyson with [facility] in [facility] [phone]) and [phone]) to request  Medical Md doesn't have one  Echo report in epic from 01-09-15  Stress test report in epic from 04-18-15  Heart cath 07/17/15 to request from [place]  EKG denies in past month  CXR denies in past 2 wks  Sleep study denies

## 2015-08-01 NOTE — Progress Notes (Signed)
Pre-op Cardiac Surgery  Carotid Findings:  There is no obvious evidence of hemodynamically significant internal carotid artery stenosis bilaterally. Vertebral arteries are patent with antegrade flow.   Upper Extremity Right Left  Brachial Pressures 142-Triphasic 137-Triphasic  Radial Waveforms Triphasic Triphasic  Ulnar Waveforms Triphasic Triphasic  Palmar Arch (Allen's Test) Within normal limits Signal obliterates with radial compression, is unaffected with ulnar compression.   08/01/2015 10:50 AM Maudry Mayhew, RVT, RDCS, RDMS

## 2015-08-02 LAB — HEMOGLOBIN A1C
Hgb A1c MFr Bld: 5.5 % (ref 4.8–5.6)
Mean Plasma Glucose: 111 mg/dL

## 2015-08-02 NOTE — Anesthesia Preprocedure Evaluation (Signed)
Anesthesia Evaluation  Patient identified by MRN, date of birth, ID band Patient awake    Reviewed: Allergy & Precautions, H&P , NPO status , Patient's Chart, lab work & pertinent test results, reviewed documented beta blocker date and time   History of Anesthesia Complications (+) PONV and history of anesthetic complications  Airway Mallampati: II  TM Distance: >3 FB Neck ROM: full    Dental no notable dental hx.    Pulmonary neg pulmonary ROS,    Pulmonary exam normal breath sounds clear to auscultation       Cardiovascular hypertension, Pt. on medications + Peripheral Vascular Disease  Normal cardiovascular exam Rhythm:regular Rate:Normal  Patient with congenital aortic stenosis that initially required commissurotomy. He then underwent aortic valve replacement with mechanical aortic valve in 2003 for bicuspid AV causing stenosis and at that time had a patch placed over an ascending aneurysm that had formed at the site of the previous aortotomy. He now presents in 2017 with a finding of ascending aorta dilation to 5.5cm that is at a yearly rupture risk and will undergo ascending aorta replacement.  Cath is clean with no CAD, normal EF   Neuro/Psych TIA   GI/Hepatic Neg liver ROS, GERD  ,  Endo/Other  Hypothyroidism   Renal/GU negative Renal ROS     Musculoskeletal  (+) Arthritis ,   Abdominal   Peds  Hematology negative hematology ROS (+)   Anesthesia Other Findings Coumadin has been stopped and bridged with lovenox for the procedure given mechanical valve  Reproductive/Obstetrics negative OB ROS                             Anesthesia Physical Anesthesia Plan  ASA: IV  Anesthesia Plan: General   Post-op Pain Management:    Induction: Intravenous  Airway Management Planned: Oral ETT  Additional Equipment: Arterial line, TEE, CVP, PA Cath and Ultrasound Guidance Line  Placement  Intra-op Plan:   Post-operative Plan: Post-operative intubation/ventilation  Informed Consent: I have reviewed the patients History and Physical, chart, labs and discussed the procedure including the risks, benefits and alternatives for the proposed anesthesia with the patient or authorized representative who has indicated his/her understanding and acceptance.   Dental Advisory Given  Plan Discussed with: Anesthesiologist, CRNA and Surgeon  Anesthesia Plan Comments: (GA with ETT size 8.0, will place 9 MAC introducer on right. Please have 4 units RBC in room prior to incision.   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 Plan to use cerebral oximetry monitoring for possible circ arrest. )        Anesthesia Quick Evaluation

## 2015-08-04 MED ORDER — MAGNESIUM SULFATE 50 % IJ SOLN
40.0000 meq | INTRAMUSCULAR | Status: DC
  Filled 2015-08-04: qty 10

## 2015-08-04 MED ORDER — NITROGLYCERIN IN D5W 200-5 MCG/ML-% IV SOLN
2.0000 ug/min | INTRAVENOUS | Status: DC
  Filled 2015-08-04: qty 250

## 2015-08-04 MED ORDER — SODIUM CHLORIDE 0.9 % IV SOLN
INTRAVENOUS | Status: DC
  Filled 2015-08-04: qty 30

## 2015-08-04 MED ORDER — DEXTROSE 5 % IV SOLN
0.0000 ug/min | INTRAVENOUS | Status: DC
  Filled 2015-08-04: qty 4

## 2015-08-04 MED ORDER — DEXTROSE 5 % IV SOLN
750.0000 mg | INTRAVENOUS | Status: DC
  Filled 2015-08-04: qty 750

## 2015-08-04 MED ORDER — VANCOMYCIN HCL 10 G IV SOLR
1500.0000 mg | INTRAVENOUS | Status: AC
  Administered 2015-08-05: 1500 mg via INTRAVENOUS
  Filled 2015-08-04: qty 1500

## 2015-08-04 MED ORDER — POTASSIUM CHLORIDE 2 MEQ/ML IV SOLN
80.0000 meq | INTRAVENOUS | Status: DC
  Filled 2015-08-04: qty 40

## 2015-08-04 MED ORDER — PHENYLEPHRINE HCL 10 MG/ML IJ SOLN
30.0000 ug/min | INTRAVENOUS | Status: DC
  Filled 2015-08-04: qty 2

## 2015-08-04 MED ORDER — DEXTROSE 5 % IV SOLN
1.5000 g | INTRAVENOUS | Status: AC
  Administered 2015-08-05: .75 g via INTRAVENOUS
  Administered 2015-08-05: 1.5 g via INTRAVENOUS
  Filled 2015-08-04 (×2): qty 1.5

## 2015-08-04 MED ORDER — METOPROLOL TARTRATE 12.5 MG HALF TABLET: 12.5000 mg | ORAL_TABLET | Freq: Once | ORAL | Status: DC

## 2015-08-04 MED ORDER — PAPAVERINE HCL 30 MG/ML IJ SOLN
INTRAMUSCULAR | Status: AC
  Administered 2015-08-05: 500 mL
  Filled 2015-08-04: qty 2.5

## 2015-08-04 MED ORDER — DEXMEDETOMIDINE HCL IN NACL 400 MCG/100ML IV SOLN
0.1000 ug/kg/h | INTRAVENOUS | Status: AC
  Administered 2015-08-05: .3 ug/kg/h via INTRAVENOUS
  Filled 2015-08-04: qty 100

## 2015-08-04 MED ORDER — AMINOCAPROIC ACID 250 MG/ML IV SOLN
INTRAVENOUS | Status: AC
  Administered 2015-08-05: 69.8 mL/h via INTRAVENOUS
  Filled 2015-08-04: qty 40

## 2015-08-04 MED ORDER — DOPAMINE-DEXTROSE 3.2-5 MG/ML-% IV SOLN
0.0000 ug/kg/min | INTRAVENOUS | Status: AC
  Administered 2015-08-05: 3 ug/kg/min via INTRAVENOUS
  Filled 2015-08-04: qty 250

## 2015-08-04 MED ORDER — SODIUM CHLORIDE 0.9 % IV SOLN
INTRAVENOUS | Status: AC
  Administered 2015-08-05: 1 [IU]/h via INTRAVENOUS
  Filled 2015-08-04: qty 2.5

## 2015-08-05 ENCOUNTER — Encounter (HOSPITAL_COMMUNITY)
Admission: RE | Disposition: A | Discharge status: Home / Self Care | Payer: Self-pay | Source: Ambulatory Visit | Attending: Cardiothoracic Surgery

## 2015-08-05 ENCOUNTER — Inpatient Hospital Stay (HOSPITAL_COMMUNITY)
Admission: RE | Admit: 2015-08-05 | Discharge: 2015-08-12 | DRG: 221 | Disposition: A | Discharge status: Home / Self Care | Payer: PRIVATE HEALTH INSURANCE | Source: Ambulatory Visit | Attending: Cardiothoracic Surgery | Admitting: Cardiothoracic Surgery

## 2015-08-05 ENCOUNTER — Inpatient Hospital Stay (HOSPITAL_COMMUNITY): Payer: PRIVATE HEALTH INSURANCE | Admitting: Anesthesiology

## 2015-08-05 ENCOUNTER — Encounter (HOSPITAL_COMMUNITY): Payer: Self-pay | Admitting: *Deleted

## 2015-08-05 ENCOUNTER — Inpatient Hospital Stay (HOSPITAL_COMMUNITY): Payer: PRIVATE HEALTH INSURANCE

## 2015-08-05 ENCOUNTER — Other Ambulatory Visit: Payer: Self-pay

## 2015-08-05 DIAGNOSIS — Z8673 Personal history of transient ischemic attack (TIA), and cerebral infarction without residual deficits: Secondary | ICD-10-CM | POA: Diagnosis not present

## 2015-08-05 DIAGNOSIS — E8770 Fluid overload, unspecified: Secondary | ICD-10-CM | POA: Diagnosis not present

## 2015-08-05 DIAGNOSIS — E209 Hypoparathyroidism, unspecified: Secondary | ICD-10-CM | POA: Diagnosis present

## 2015-08-05 DIAGNOSIS — R42 Dizziness and giddiness: Secondary | ICD-10-CM | POA: Diagnosis present

## 2015-08-05 DIAGNOSIS — Z09 Encounter for follow-up examination after completed treatment for conditions other than malignant neoplasm: Secondary | ICD-10-CM

## 2015-08-05 DIAGNOSIS — Z8249 Family history of ischemic heart disease and other diseases of the circulatory system: Secondary | ICD-10-CM | POA: Diagnosis not present

## 2015-08-05 DIAGNOSIS — E890 Postprocedural hypothyroidism: Secondary | ICD-10-CM | POA: Diagnosis present

## 2015-08-05 DIAGNOSIS — Z952 Presence of prosthetic heart valve: Secondary | ICD-10-CM

## 2015-08-05 DIAGNOSIS — K5900 Constipation, unspecified: Secondary | ICD-10-CM | POA: Diagnosis not present

## 2015-08-05 DIAGNOSIS — E785 Hyperlipidemia, unspecified: Secondary | ICD-10-CM | POA: Diagnosis present

## 2015-08-05 DIAGNOSIS — Z96642 Presence of left artificial hip joint: Secondary | ICD-10-CM | POA: Diagnosis present

## 2015-08-05 DIAGNOSIS — R911 Solitary pulmonary nodule: Secondary | ICD-10-CM | POA: Diagnosis present

## 2015-08-05 DIAGNOSIS — K219 Gastro-esophageal reflux disease without esophagitis: Secondary | ICD-10-CM | POA: Diagnosis present

## 2015-08-05 DIAGNOSIS — I712 Thoracic aortic aneurysm, without rupture, unspecified: Secondary | ICD-10-CM

## 2015-08-05 DIAGNOSIS — Z9689 Presence of other specified functional implants: Secondary | ICD-10-CM

## 2015-08-05 HISTORY — PX: BENTALL PROCEDURE: SHX5058

## 2015-08-05 HISTORY — PX: TEE WITHOUT CARDIOVERSION: SHX5443

## 2015-08-05 LAB — CBC
HCT: 46.9 % (ref 39.0–52.0)
HCT: 41.8 % (ref 39.0–52.0)
Hemoglobin: 16.2 g/dL (ref 13.0–17.0)
Hemoglobin: 14 g/dL (ref 13.0–17.0)
MCH: 29.3 pg (ref 26.0–34.0)
MCH: 28.6 pg (ref 26.0–34.0)
MCHC: 34.5 g/dL (ref 30.0–36.0)
MCHC: 33.5 g/dL (ref 30.0–36.0)
MCV: 85 fL (ref 78.0–100.0)
MCV: 85.3 fL (ref 78.0–100.0)
PLATELETS: 120 10*3/uL — AB (ref 150–400)
Platelets: 121 10*3/uL — ABNORMAL LOW (ref 150–400)
RBC: 5.52 MIL/uL (ref 4.22–5.81)
RBC: 4.9 MIL/uL (ref 4.22–5.81)
RDW: 13.3 % (ref 11.5–15.5)
RDW: 13.3 % (ref 11.5–15.5)
WBC: 16.4 10*3/uL — AB (ref 4.0–10.5)
WBC: 14.6 10*3/uL — ABNORMAL HIGH (ref 4.0–10.5)

## 2015-08-05 LAB — POCT I-STAT 4, (NA,K, GLUC, HGB,HCT)
Glucose, Bld: 156 mg/dL — ABNORMAL HIGH (ref 65–99)
HCT: 49 % (ref 39.0–52.0)
Hemoglobin: 16.7 g/dL (ref 13.0–17.0)
Potassium: 3.4 mmol/L — ABNORMAL LOW (ref 3.5–5.1)
Sodium: 143 mmol/L (ref 135–145)

## 2015-08-05 LAB — POCT I-STAT, CHEM 8
BUN: 10 mg/dL (ref 6–20)
BUN: 8 mg/dL (ref 6–20)
BUN: 6 mg/dL (ref 6–20)
BUN: 7 mg/dL (ref 6–20)
BUN: 7 mg/dL (ref 6–20)
BUN: 8 mg/dL (ref 6–20)
BUN: 7 mg/dL (ref 6–20)
BUN: 7 mg/dL (ref 6–20)
BUN: 7 mg/dL (ref 6–20)
Calcium, Ion: 0.88 mmol/L — ABNORMAL LOW (ref 1.12–1.23)
Calcium, Ion: 0.92 mmol/L — ABNORMAL LOW (ref 1.12–1.23)
Calcium, Ion: 0.8 mmol/L — ABNORMAL LOW (ref 1.12–1.23)
Calcium, Ion: 0.83 mmol/L — ABNORMAL LOW (ref 1.12–1.23)
Calcium, Ion: 0.78 mmol/L — ABNORMAL LOW (ref 1.12–1.23)
Calcium, Ion: 0.87 mmol/L — ABNORMAL LOW (ref 1.12–1.23)
Calcium, Ion: 0.73 mmol/L — ABNORMAL LOW (ref 1.12–1.23)
Calcium, Ion: 1.09 mmol/L — ABNORMAL LOW (ref 1.12–1.23)
Calcium, Ion: 1 mmol/L — ABNORMAL LOW (ref 1.12–1.23)
Chloride: 102 mmol/L (ref 101–111)
Chloride: 104 mmol/L (ref 101–111)
Chloride: 100 mmol/L — ABNORMAL LOW (ref 101–111)
Chloride: 99 mmol/L — ABNORMAL LOW (ref 101–111)
Chloride: 101 mmol/L (ref 101–111)
Chloride: 99 mmol/L — ABNORMAL LOW (ref 101–111)
Chloride: 97 mmol/L — ABNORMAL LOW (ref 101–111)
Chloride: 103 mmol/L (ref 101–111)
Chloride: 104 mmol/L (ref 101–111)
Creatinine, Ser: 0.6 mg/dL — ABNORMAL LOW (ref 0.61–1.24)
Creatinine, Ser: 0.6 mg/dL — ABNORMAL LOW (ref 0.61–1.24)
Creatinine, Ser: 0.5 mg/dL — ABNORMAL LOW (ref 0.61–1.24)
Creatinine, Ser: 0.6 mg/dL — ABNORMAL LOW (ref 0.61–1.24)
Creatinine, Ser: 0.5 mg/dL — ABNORMAL LOW (ref 0.61–1.24)
Creatinine, Ser: 0.6 mg/dL — ABNORMAL LOW (ref 0.61–1.24)
Creatinine, Ser: 0.5 mg/dL — ABNORMAL LOW (ref 0.61–1.24)
Creatinine, Ser: 0.5 mg/dL — ABNORMAL LOW (ref 0.61–1.24)
Creatinine, Ser: 0.7 mg/dL (ref 0.61–1.24)
Glucose, Bld: 110 mg/dL — ABNORMAL HIGH (ref 65–99)
Glucose, Bld: 117 mg/dL — ABNORMAL HIGH (ref 65–99)
Glucose, Bld: 100 mg/dL — ABNORMAL HIGH (ref 65–99)
Glucose, Bld: 121 mg/dL — ABNORMAL HIGH (ref 65–99)
Glucose, Bld: 161 mg/dL — ABNORMAL HIGH (ref 65–99)
Glucose, Bld: 164 mg/dL — ABNORMAL HIGH (ref 65–99)
Glucose, Bld: 195 mg/dL — ABNORMAL HIGH (ref 65–99)
Glucose, Bld: 189 mg/dL — ABNORMAL HIGH (ref 65–99)
Glucose, Bld: 149 mg/dL — ABNORMAL HIGH (ref 65–99)
HCT: 36 % — ABNORMAL LOW (ref 39.0–52.0)
HCT: 36 % — ABNORMAL LOW (ref 39.0–52.0)
HCT: 28 % — ABNORMAL LOW (ref 39.0–52.0)
HCT: 32 % — ABNORMAL LOW (ref 39.0–52.0)
HCT: 29 % — ABNORMAL LOW (ref 39.0–52.0)
HCT: 30 % — ABNORMAL LOW (ref 39.0–52.0)
HCT: 22 % — ABNORMAL LOW (ref 39.0–52.0)
HCT: 27 % — ABNORMAL LOW (ref 39.0–52.0)
HCT: 41 % (ref 39.0–52.0)
Hemoglobin: 12.2 g/dL — ABNORMAL LOW (ref 13.0–17.0)
Hemoglobin: 12.2 g/dL — ABNORMAL LOW (ref 13.0–17.0)
Hemoglobin: 9.5 g/dL — ABNORMAL LOW (ref 13.0–17.0)
Hemoglobin: 10.9 g/dL — ABNORMAL LOW (ref 13.0–17.0)
Hemoglobin: 10.2 g/dL — ABNORMAL LOW (ref 13.0–17.0)
Hemoglobin: 9.9 g/dL — ABNORMAL LOW (ref 13.0–17.0)
Hemoglobin: 7.5 g/dL — ABNORMAL LOW (ref 13.0–17.0)
Hemoglobin: 9.2 g/dL — ABNORMAL LOW (ref 13.0–17.0)
Hemoglobin: 13.9 g/dL (ref 13.0–17.0)
Potassium: 3.7 mmol/L (ref 3.5–5.1)
Potassium: 3.4 mmol/L — ABNORMAL LOW (ref 3.5–5.1)
Potassium: 3.7 mmol/L (ref 3.5–5.1)
Potassium: 3.3 mmol/L — ABNORMAL LOW (ref 3.5–5.1)
Potassium: 3.2 mmol/L — ABNORMAL LOW (ref 3.5–5.1)
Potassium: 4.6 mmol/L (ref 3.5–5.1)
Potassium: 4 mmol/L (ref 3.5–5.1)
Potassium: 3.2 mmol/L — ABNORMAL LOW (ref 3.5–5.1)
Potassium: 4.1 mmol/L (ref 3.5–5.1)
Sodium: 141 mmol/L (ref 135–145)
Sodium: 144 mmol/L (ref 135–145)
Sodium: 141 mmol/L (ref 135–145)
Sodium: 139 mmol/L (ref 135–145)
Sodium: 137 mmol/L (ref 135–145)
Sodium: 138 mmol/L (ref 135–145)
Sodium: 139 mmol/L (ref 135–145)
Sodium: 142 mmol/L (ref 135–145)
Sodium: 142 mmol/L (ref 135–145)
TCO2: 29 mmol/L (ref 0–100)
TCO2: 28 mmol/L (ref 0–100)
TCO2: 26 mmol/L (ref 0–100)
TCO2: 27 mmol/L (ref 0–100)
TCO2: 25 mmol/L (ref 0–100)
TCO2: 30 mmol/L (ref 0–100)
TCO2: 25 mmol/L (ref 0–100)
TCO2: 24 mmol/L (ref 0–100)
TCO2: 23 mmol/L (ref 0–100)

## 2015-08-05 LAB — POCT I-STAT 3, ART BLOOD GAS (G3+)
Acid-Base Excess: 1 mmol/L (ref 0.0–2.0)
Acid-Base Excess: 2 mmol/L (ref 0.0–2.0)
Acid-base deficit: 1 mmol/L (ref 0.0–2.0)
Acid-base deficit: 2 mmol/L (ref 0.0–2.0)
Bicarbonate: 25.4 mEq/L — ABNORMAL HIGH (ref 20.0–24.0)
Bicarbonate: 29.7 mEq/L — ABNORMAL HIGH (ref 20.0–24.0)
Bicarbonate: 22.8 mEq/L (ref 20.0–24.0)
Bicarbonate: 23.1 mEq/L (ref 20.0–24.0)
O2 Saturation: 100 %
O2 Saturation: 100 %
O2 Saturation: 100 %
O2 Saturation: 99 %
Patient temperature: 36
TCO2: 27 mmol/L (ref 0–100)
TCO2: 32 mmol/L (ref 0–100)
TCO2: 24 mmol/L (ref 0–100)
TCO2: 24 mmol/L (ref 0–100)
pCO2 arterial: 38.9 mmHg (ref 35.0–45.0)
pCO2 arterial: 65.8 mmHg (ref 35.0–45.0)
pCO2 arterial: 33.5 mmHg — ABNORMAL LOW (ref 35.0–45.0)
pCO2 arterial: 37.7 mmHg (ref 35.0–45.0)
pH, Arterial: 7.422 (ref 7.350–7.450)
pH, Arterial: 7.263 — ABNORMAL LOW (ref 7.350–7.450)
pH, Arterial: 7.442 (ref 7.350–7.450)
pH, Arterial: 7.392 (ref 7.350–7.450)
pO2, Arterial: 258 mmHg — ABNORMAL HIGH (ref 80.0–100.0)
pO2, Arterial: 452 mmHg — ABNORMAL HIGH (ref 80.0–100.0)
pO2, Arterial: 254 mmHg — ABNORMAL HIGH (ref 80.0–100.0)
pO2, Arterial: 130 mmHg — ABNORMAL HIGH (ref 80.0–100.0)

## 2015-08-05 LAB — PROTIME-INR
INR: 1.09 (ref 0.00–1.49)
INR: 1.45 (ref 0.00–1.49)
Prothrombin Time: 14.3 s (ref 11.6–15.2)
Prothrombin Time: 17.7 seconds — ABNORMAL HIGH (ref 11.6–15.2)

## 2015-08-05 LAB — PREPARE RBC (CROSSMATCH)

## 2015-08-05 LAB — HEMOGLOBIN AND HEMATOCRIT, BLOOD
HCT: 29 % — ABNORMAL LOW (ref 39.0–52.0)
Hemoglobin: 9.8 g/dL — ABNORMAL LOW (ref 13.0–17.0)

## 2015-08-05 LAB — PLATELET COUNT: Platelets: 122 10*3/uL — ABNORMAL LOW (ref 150–400)

## 2015-08-05 LAB — APTT
APTT: 34 s (ref 24–37)
aPTT: 35 seconds (ref 24–37)

## 2015-08-05 LAB — FIBRINOGEN: Fibrinogen: 193 mg/dL — ABNORMAL LOW (ref 204–475)

## 2015-08-05 LAB — MAGNESIUM: Magnesium: 1.9 mg/dL (ref 1.7–2.4)

## 2015-08-05 SURGERY — REDO STERNOTOMY
Anesthesia: General

## 2015-08-05 MED ORDER — BISACODYL 10 MG RE SUPP: 10.0000 mg | Freq: Every day | RECTAL | Status: DC

## 2015-08-05 MED ORDER — ACETAMINOPHEN 160 MG/5ML PO SOLN: 650.0000 mg | Freq: Once | ORAL | Status: AC

## 2015-08-05 MED ORDER — SODIUM CHLORIDE 0.45 % IV SOLN: INTRAVENOUS | Status: DC | PRN

## 2015-08-05 MED ORDER — ALBUMIN HUMAN 5 % IV SOLN
250.0000 mL | INTRAVENOUS | Status: AC | PRN
  Administered 2015-08-05 (×2): 250 mL via INTRAVENOUS

## 2015-08-05 MED ORDER — EPHEDRINE SULFATE 50 MG/ML IJ SOLN
INTRAMUSCULAR | Status: DC | PRN
  Administered 2015-08-05: 10 mg via INTRAVENOUS

## 2015-08-05 MED ORDER — ASPIRIN EC 325 MG PO TBEC
325.0000 mg | DELAYED_RELEASE_TABLET | Freq: Every day | ORAL | Status: DC
  Administered 2015-08-06: 325 mg via ORAL
  Filled 2015-08-05 (×2): qty 1

## 2015-08-05 MED ORDER — METOPROLOL TARTRATE 25 MG/10 ML ORAL SUSPENSION
12.5000 mg | Freq: Two times a day (BID) | ORAL | Status: DC
Start: 2015-08-05 — End: 2015-08-08
  Filled 2015-08-05: qty 5

## 2015-08-05 MED ORDER — CALCIUM CHLORIDE 10 % IV SOLN
INTRAVENOUS | Status: DC | PRN
  Administered 2015-08-05 (×2): 250 mg via INTRAVENOUS

## 2015-08-05 MED ORDER — SODIUM CHLORIDE 0.9% FLUSH
3.0000 mL | Freq: Two times a day (BID) | INTRAVENOUS | Status: DC
  Administered 2015-08-06 – 2015-08-07 (×4): 3 mL via INTRAVENOUS

## 2015-08-05 MED ORDER — LACTATED RINGERS IV SOLN: INTRAVENOUS | Status: DC

## 2015-08-05 MED ORDER — DEXMEDETOMIDINE HCL IN NACL 400 MCG/100ML IV SOLN: 0.1000 ug/kg/h | INTRAVENOUS | Status: DC

## 2015-08-05 MED ORDER — ACETAMINOPHEN 160 MG/5ML PO SOLN: 1000.0000 mg | Freq: Four times a day (QID) | ORAL | Status: DC

## 2015-08-05 MED ORDER — SODIUM CHLORIDE 0.9 % IV SOLN
0.5000 g/h | Freq: Once | INTRAVENOUS | Status: DC
  Filled 2015-08-05: qty 20

## 2015-08-05 MED ORDER — PHENYLEPHRINE HCL 10 MG/ML IJ SOLN
0.0000 ug/min | INTRAMUSCULAR | Status: DC
  Filled 2015-08-05 (×3): qty 2

## 2015-08-05 MED ORDER — SODIUM CHLORIDE 0.9 % IV SOLN
10.0000 mg | INTRAVENOUS | Status: DC | PRN
  Administered 2015-08-05: 15 ug/min via INTRAVENOUS

## 2015-08-05 MED ORDER — ACETAMINOPHEN 650 MG RE SUPP
650.0000 mg | Freq: Once | RECTAL | Status: AC
  Administered 2015-08-05: 650 mg via RECTAL

## 2015-08-05 MED ORDER — ACETAMINOPHEN 500 MG PO TABS
1000.0000 mg | ORAL_TABLET | Freq: Four times a day (QID) | ORAL | Status: DC
  Administered 2015-08-06 – 2015-08-08 (×7): 1000 mg via ORAL
  Filled 2015-08-05 (×7): qty 2

## 2015-08-05 MED ORDER — AMIODARONE HCL IN DEXTROSE 360-4.14 MG/200ML-% IV SOLN: 30.0000 mg/h | INTRAVENOUS | Status: DC

## 2015-08-05 MED ORDER — HEPARIN SODIUM (PORCINE) 1000 UNIT/ML IJ SOLN
INTRAMUSCULAR | Status: DC | PRN
  Administered 2015-08-05: 27000 [IU] via INTRAVENOUS

## 2015-08-05 MED ORDER — LEVOTHYROXINE SODIUM 75 MCG PO TABS
175.0000 ug | ORAL_TABLET | Freq: Every day | ORAL | Status: DC
  Administered 2015-08-06 – 2015-08-12 (×7): 175 ug via ORAL
  Filled 2015-08-05 (×7): qty 1

## 2015-08-05 MED ORDER — STERILE WATER FOR INJECTION IJ SOLN
INTRAMUSCULAR | Status: AC
  Filled 2015-08-05: qty 30

## 2015-08-05 MED ORDER — NOREPINEPHRINE BITARTRATE 1 MG/ML IV SOLN
0.0000 ug/min | INTRAVENOUS | Status: DC
  Filled 2015-08-05: qty 4

## 2015-08-05 MED ORDER — ASPIRIN 81 MG PO CHEW
324.0000 mg | CHEWABLE_TABLET | Freq: Every day | ORAL | Status: DC
Start: 2015-08-06 — End: 2015-08-07
  Administered 2015-08-07: 324 mg
  Filled 2015-08-05: qty 4

## 2015-08-05 MED ORDER — CHLORHEXIDINE GLUCONATE 0.12 % MT SOLN
15.0000 mL | OROMUCOSAL | Status: AC
  Administered 2015-08-05: 15 mL via OROMUCOSAL

## 2015-08-05 MED ORDER — SODIUM CHLORIDE 0.9 % IV SOLN: INTRAVENOUS | Status: DC

## 2015-08-05 MED ORDER — SODIUM CHLORIDE 0.9 % IJ SOLN
OROMUCOSAL | Status: DC | PRN
  Administered 2015-08-05: 4 mL via TOPICAL

## 2015-08-05 MED ORDER — HEMOSTATIC AGENTS (NO CHARGE) OPTIME
TOPICAL | Status: DC | PRN
  Administered 2015-08-05 (×2): 1 via TOPICAL

## 2015-08-05 MED ORDER — ANTISEPTIC ORAL RINSE SOLUTION (CORINZ)
7.0000 mL | Freq: Four times a day (QID) | OROMUCOSAL | Status: DC
  Administered 2015-08-06 (×2): 7 mL via OROMUCOSAL

## 2015-08-05 MED ORDER — DOCUSATE SODIUM 100 MG PO CAPS
200.0000 mg | ORAL_CAPSULE | Freq: Every day | ORAL | Status: DC
  Administered 2015-08-06 – 2015-08-07 (×2): 200 mg via ORAL
  Filled 2015-08-05 (×3): qty 2

## 2015-08-05 MED ORDER — DOPAMINE-DEXTROSE 3.2-5 MG/ML-% IV SOLN: 0.0000 ug/kg/min | INTRAVENOUS | Status: DC

## 2015-08-05 MED ORDER — FENTANYL CITRATE (PF) 250 MCG/5ML IJ SOLN
INTRAMUSCULAR | Status: AC
  Filled 2015-08-05: qty 25

## 2015-08-05 MED ORDER — PLASMA-LYTE 148 IV SOLN: INTRAVENOUS | Status: DC

## 2015-08-05 MED ORDER — LACTATED RINGERS IV SOLN
INTRAVENOUS | Status: DC | PRN
  Administered 2015-08-05 (×2): via INTRAVENOUS

## 2015-08-05 MED ORDER — LACTATED RINGERS IV SOLN: INTRAVENOUS | Status: DC | PRN

## 2015-08-05 MED ORDER — MIDAZOLAM HCL 2 MG/2ML IJ SOLN: 2.0000 mg | INTRAMUSCULAR | Status: DC | PRN

## 2015-08-05 MED ORDER — CHLORHEXIDINE GLUCONATE 0.12% ORAL RINSE (MEDLINE KIT)
15.0000 mL | Freq: Two times a day (BID) | OROMUCOSAL | Status: DC
Start: 2015-08-05 — End: 2015-08-06
  Administered 2015-08-05 – 2015-08-06 (×2): 15 mL via OROMUCOSAL

## 2015-08-05 MED ORDER — MILRINONE IN DEXTROSE 20 MG/100ML IV SOLN
0.1250 ug/kg/min | INTRAVENOUS | Status: DC
  Filled 2015-08-05: qty 100

## 2015-08-05 MED ORDER — OXYCODONE HCL 5 MG PO TABS
5.0000 mg | ORAL_TABLET | ORAL | Status: DC | PRN
  Administered 2015-08-06: 5 mg via ORAL
  Administered 2015-08-06: 10 mg via ORAL
  Administered 2015-08-06: 5 mg via ORAL
  Administered 2015-08-07 – 2015-08-08 (×7): 10 mg via ORAL
  Filled 2015-08-05 (×10): qty 2
  Filled 2015-08-05: qty 1

## 2015-08-05 MED ORDER — DOPAMINE-DEXTROSE 3.2-5 MG/ML-% IV SOLN: 0.0000 ug/kg/min | INTRAVENOUS | Status: AC

## 2015-08-05 MED ORDER — LIDOCAINE HCL (CARDIAC) 20 MG/ML IV SOLN
INTRAVENOUS | Status: DC | PRN
  Administered 2015-08-05: 100 mg via INTRAVENOUS

## 2015-08-05 MED ORDER — PROTAMINE SULFATE 10 MG/ML IV SOLN
INTRAVENOUS | Status: DC | PRN
  Administered 2015-08-05: 10 mg via INTRAVENOUS

## 2015-08-05 MED ORDER — MORPHINE SULFATE (PF) 2 MG/ML IV SOLN
2.0000 mg | INTRAVENOUS | Status: DC | PRN
  Administered 2015-08-06 (×5): 2 mg via INTRAVENOUS
  Filled 2015-08-05 (×5): qty 1

## 2015-08-05 MED ORDER — FAMOTIDINE IN NACL 20-0.9 MG/50ML-% IV SOLN
20.0000 mg | Freq: Two times a day (BID) | INTRAVENOUS | Status: AC
  Administered 2015-08-05 – 2015-08-06 (×2): 20 mg via INTRAVENOUS
  Filled 2015-08-05 (×2): qty 50

## 2015-08-05 MED ORDER — METHYLPREDNISOLONE SODIUM SUCC 125 MG IJ SOLR
INTRAMUSCULAR | Status: DC | PRN
  Administered 2015-08-05: 125 mg via INTRAVENOUS

## 2015-08-05 MED ORDER — SODIUM CHLORIDE 0.9 % IJ SOLN
INTRAMUSCULAR | Status: DC | PRN
  Administered 2015-08-05: 4 mL via TOPICAL

## 2015-08-05 MED ORDER — GELATIN ABSORBABLE MT POWD
OROMUCOSAL | Status: DC | PRN
  Administered 2015-08-05: 4 mL via TOPICAL

## 2015-08-05 MED ORDER — VECURONIUM BROMIDE 10 MG IV SOLR
INTRAVENOUS | Status: AC
  Filled 2015-08-05: qty 30

## 2015-08-05 MED ORDER — SODIUM CHLORIDE 0.9% FLUSH: 3.0000 mL | INTRAVENOUS | Status: DC | PRN

## 2015-08-05 MED ORDER — ALBUMIN HUMAN 5 % IV SOLN
INTRAVENOUS | Status: DC | PRN
  Administered 2015-08-05: 17:00:00 via INTRAVENOUS

## 2015-08-05 MED ORDER — MIDAZOLAM HCL 10 MG/2ML IJ SOLN
INTRAMUSCULAR | Status: AC
  Filled 2015-08-05: qty 2

## 2015-08-05 MED ORDER — CHLORHEXIDINE GLUCONATE 0.12 % MT SOLN: 15.0000 mL | Freq: Once | OROMUCOSAL | Status: DC

## 2015-08-05 MED ORDER — PROPOFOL 10 MG/ML IV BOLUS
INTRAVENOUS | Status: DC | PRN
  Administered 2015-08-05 (×2): 100 mg via INTRAVENOUS
  Administered 2015-08-05: 60 mg via INTRAVENOUS
  Administered 2015-08-05: 100 mg via INTRAVENOUS

## 2015-08-05 MED ORDER — ONDANSETRON HCL 4 MG/2ML IJ SOLN
4.0000 mg | Freq: Four times a day (QID) | INTRAMUSCULAR | Status: DC | PRN
  Administered 2015-08-06 – 2015-08-07 (×2): 4 mg via INTRAVENOUS
  Filled 2015-08-05 (×2): qty 2

## 2015-08-05 MED ORDER — CHLORHEXIDINE GLUCONATE 0.12 % MT SOLN
OROMUCOSAL | Status: AC
  Administered 2015-08-05: 15 mL
  Filled 2015-08-05: qty 15

## 2015-08-05 MED ORDER — MAGNESIUM SULFATE 50 % IJ SOLN: 40.0000 meq | INTRAMUSCULAR | Status: DC

## 2015-08-05 MED ORDER — PANTOPRAZOLE SODIUM 40 MG PO TBEC
40.0000 mg | DELAYED_RELEASE_TABLET | Freq: Every day | ORAL | Status: DC
  Administered 2015-08-07: 40 mg via ORAL
  Filled 2015-08-05 (×2): qty 1

## 2015-08-05 MED ORDER — BISACODYL 5 MG PO TBEC
10.0000 mg | DELAYED_RELEASE_TABLET | Freq: Every day | ORAL | Status: DC
  Administered 2015-08-06 – 2015-08-07 (×2): 10 mg via ORAL
  Filled 2015-08-05 (×3): qty 2

## 2015-08-05 MED ORDER — LACTATED RINGERS IV SOLN
INTRAVENOUS | Status: DC | PRN
Start: 2015-08-05 — End: 2015-08-05
  Administered 2015-08-05: 07:00:00 via INTRAVENOUS

## 2015-08-05 MED ORDER — VECURONIUM BROMIDE 10 MG IV SOLR: INTRAVENOUS | Status: DC | PRN

## 2015-08-05 MED ORDER — METOPROLOL TARTRATE 12.5 MG HALF TABLET
12.5000 mg | ORAL_TABLET | Freq: Two times a day (BID) | ORAL | Status: DC
  Administered 2015-08-07 (×2): 12.5 mg via ORAL
  Filled 2015-08-05 (×5): qty 1

## 2015-08-05 MED ORDER — PHENYLEPHRINE HCL 10 MG/ML IJ SOLN: 30.0000 ug/min | INTRAVENOUS | Status: DC

## 2015-08-05 MED ORDER — 0.9 % SODIUM CHLORIDE (POUR BTL) OPTIME
TOPICAL | Status: DC | PRN
  Administered 2015-08-05: 6000 mL

## 2015-08-05 MED ORDER — MIDAZOLAM HCL 5 MG/5ML IJ SOLN
INTRAMUSCULAR | Status: DC | PRN
  Administered 2015-08-05: 4 mg via INTRAVENOUS
  Administered 2015-08-05: 1 mg via INTRAVENOUS
  Administered 2015-08-05: 2 mg via INTRAVENOUS
  Administered 2015-08-05 (×3): 1 mg via INTRAVENOUS

## 2015-08-05 MED ORDER — NITROGLYCERIN IN D5W 200-5 MCG/ML-% IV SOLN: 0.0000 ug/min | INTRAVENOUS | Status: DC

## 2015-08-05 MED ORDER — METOPROLOL TARTRATE 1 MG/ML IV SOLN: 2.5000 mg | INTRAVENOUS | Status: DC | PRN

## 2015-08-05 MED ORDER — PROPOFOL 10 MG/ML IV BOLUS
INTRAVENOUS | Status: AC
  Filled 2015-08-05: qty 40

## 2015-08-05 MED ORDER — PROPOFOL 10 MG/ML IV BOLUS
INTRAVENOUS | Status: AC
  Filled 2015-08-05: qty 20

## 2015-08-05 MED ORDER — MORPHINE SULFATE (PF) 2 MG/ML IV SOLN: 1.0000 mg | INTRAVENOUS | Status: AC | PRN

## 2015-08-05 MED ORDER — ROCURONIUM BROMIDE 100 MG/10ML IV SOLN
INTRAVENOUS | Status: DC | PRN
Start: 2015-08-05 — End: 2015-08-05
  Administered 2015-08-05: 50 mg via INTRAVENOUS

## 2015-08-05 MED ORDER — PHENYLEPHRINE HCL 10 MG/ML IJ SOLN
INTRAMUSCULAR | Status: DC | PRN
  Administered 2015-08-05: 80 ug via INTRAVENOUS

## 2015-08-05 MED ORDER — SODIUM CHLORIDE 0.9 % IV SOLN
INTRAVENOUS | Status: DC
  Administered 2015-08-05: 20:00:00 via INTRAVENOUS
  Filled 2015-08-05 (×2): qty 2.5

## 2015-08-05 MED ORDER — VANCOMYCIN HCL IN DEXTROSE 1-5 GM/200ML-% IV SOLN
1000.0000 mg | Freq: Once | INTRAVENOUS | Status: AC
  Administered 2015-08-06: 1000 mg via INTRAVENOUS
  Filled 2015-08-05 (×2): qty 200

## 2015-08-05 MED ORDER — INSULIN REGULAR BOLUS VIA INFUSION
0.0000 [IU] | Freq: Three times a day (TID) | INTRAVENOUS | Status: DC
  Filled 2015-08-05: qty 10

## 2015-08-05 MED ORDER — DEXTROSE 5 % IV SOLN
1.5000 g | Freq: Two times a day (BID) | INTRAVENOUS | Status: AC
  Administered 2015-08-05 – 2015-08-07 (×4): 1.5 g via INTRAVENOUS
  Filled 2015-08-05 (×4): qty 1.5

## 2015-08-05 MED ORDER — FENTANYL CITRATE (PF) 100 MCG/2ML IJ SOLN
INTRAMUSCULAR | Status: DC | PRN
  Administered 2015-08-05: 100 ug via INTRAVENOUS
  Administered 2015-08-05: 150 ug via INTRAVENOUS
  Administered 2015-08-05: 500 ug via INTRAVENOUS
  Administered 2015-08-05: 100 ug via INTRAVENOUS
  Administered 2015-08-05 (×3): 50 ug via INTRAVENOUS
  Administered 2015-08-05: 100 ug via INTRAVENOUS
  Administered 2015-08-05: 150 ug via INTRAVENOUS
  Administered 2015-08-05 (×2): 100 ug via INTRAVENOUS
  Administered 2015-08-05 (×2): 150 ug via INTRAVENOUS

## 2015-08-05 MED ORDER — DEXTROSE 5 % IV SOLN: 750.0000 mg | INTRAVENOUS | Status: DC

## 2015-08-05 MED ORDER — POTASSIUM CHLORIDE 10 MEQ/50ML IV SOLN
10.0000 meq | INTRAVENOUS | Status: AC
  Administered 2015-08-05 (×3): 10 meq via INTRAVENOUS

## 2015-08-05 MED ORDER — VECURONIUM BROMIDE 10 MG IV SOLR
INTRAVENOUS | Status: DC | PRN
  Administered 2015-08-05 (×7): 5 mg via INTRAVENOUS

## 2015-08-05 MED ORDER — SODIUM CHLORIDE 0.9 % IV SOLN: 250.0000 mL | INTRAVENOUS | Status: DC

## 2015-08-05 MED ORDER — EPINEPHRINE HCL 1 MG/ML IJ SOLN: 0.0000 ug/min | INTRAVENOUS | Status: DC

## 2015-08-05 MED ORDER — DEXMEDETOMIDINE HCL IN NACL 200 MCG/50ML IV SOLN
0.0000 ug/kg/h | INTRAVENOUS | Status: DC
  Administered 2015-08-05: 0 ug/kg/h via INTRAVENOUS
  Filled 2015-08-05 (×2): qty 50

## 2015-08-05 MED ORDER — AMIODARONE HCL IN DEXTROSE 360-4.14 MG/200ML-% IV SOLN
60.0000 mg/h | INTRAVENOUS | Status: DC
  Filled 2015-08-05: qty 200

## 2015-08-05 MED ORDER — POTASSIUM CHLORIDE 2 MEQ/ML IV SOLN: 80.0000 meq | INTRAVENOUS | Status: DC

## 2015-08-05 MED ORDER — LACTATED RINGERS IV SOLN
INTRAVENOUS | Status: DC | PRN
  Administered 2015-08-05 (×2): via INTRAVENOUS

## 2015-08-05 MED ORDER — NITROGLYCERIN IN D5W 200-5 MCG/ML-% IV SOLN: 2.0000 ug/min | INTRAVENOUS | Status: DC

## 2015-08-05 MED ORDER — MAGNESIUM SULFATE 4 GM/100ML IV SOLN
4.0000 g | Freq: Once | INTRAVENOUS | Status: AC
  Administered 2015-08-05: 4 g via INTRAVENOUS
  Filled 2015-08-05: qty 100

## 2015-08-05 MED ORDER — AMIODARONE HCL IN DEXTROSE 360-4.14 MG/200ML-% IV SOLN
30.0000 mg/h | INTRAVENOUS | Status: DC
  Administered 2015-08-05: 30 mg/h via INTRAVENOUS
  Filled 2015-08-05: qty 200

## 2015-08-05 MED ORDER — FENTANYL CITRATE (PF) 250 MCG/5ML IJ SOLN
INTRAMUSCULAR | Status: AC
  Filled 2015-08-05: qty 10

## 2015-08-05 MED ORDER — AMIODARONE HCL IN DEXTROSE 360-4.14 MG/200ML-% IV SOLN
60.0000 mg/h | INTRAVENOUS | Status: AC
  Administered 2015-08-05: 150 mg/h via INTRAVENOUS
  Filled 2015-08-05: qty 200

## 2015-08-05 MED ORDER — LACTATED RINGERS IV SOLN: 500.0000 mL | Freq: Once | INTRAVENOUS | Status: DC | PRN

## 2015-08-05 MED ORDER — TRAMADOL HCL 50 MG PO TABS
50.0000 mg | ORAL_TABLET | ORAL | Status: DC | PRN
  Administered 2015-08-06: 100 mg via ORAL
  Filled 2015-08-05: qty 2

## 2015-08-05 MED FILL — Magnesium Sulfate Inj 50%: INTRAMUSCULAR | Qty: 10 | Status: AC

## 2015-08-05 MED FILL — Heparin Sodium (Porcine) Inj 1000 Unit/ML: INTRAMUSCULAR | Qty: 30 | Status: AC

## 2015-08-05 MED FILL — Potassium Chloride Inj 2 mEq/ML: INTRAVENOUS | Qty: 40 | Status: AC

## 2015-08-05 SURGICAL SUPPLY — 109 items
ADAPTER CARDIO PERF ANTE/RETRO (ADAPTER) ×2 IMPLANT
APPLICATOR COTTON TIP 6IN STRL (MISCELLANEOUS) IMPLANT
APPLICATOR TIP COSEAL (VASCULAR PRODUCTS) ×4 IMPLANT
BAG DECANTER FOR FLEXI CONT (MISCELLANEOUS) ×2 IMPLANT
BLADE CORE FAN STRYKER (BLADE) ×4 IMPLANT
BLADE STERNUM SYSTEM 6 (BLADE) ×2 IMPLANT
BLADE SURG 15 STRL LF DISP TIS (BLADE) ×2 IMPLANT
BLADE SURG 15 STRL SS (BLADE) ×2
BOOT SUTURE AID YELLOW STND (SUTURE) IMPLANT
CANISTER SUCTION 2500CC (MISCELLANEOUS) ×2 IMPLANT
CANNULA GRAFT 8MMX50CM (Graft) ×2 IMPLANT
CANNULA GUNDRY RCSP 15FR (MISCELLANEOUS) ×4 IMPLANT
CANNULA SUMP PERICARDIAL (CANNULA) ×2 IMPLANT
CATH CPB KIT GERHARDT (MISCELLANEOUS) IMPLANT
CATH HEART VENT LEFT (CATHETERS) ×1 IMPLANT
CATH/SQUID NICHOLS JEHLE COR (CATHETERS) ×2 IMPLANT
CAUTERY EYE LOW TEMP 1300F FIN (OPHTHALMIC RELATED) ×2 IMPLANT
CAUTERY SURG HI TEMP FINE TIP (MISCELLANEOUS) ×4 IMPLANT
CLIP FOGARTY SPRING 6M (CLIP) IMPLANT
CONN ST 1/4X3/8  BEN (MISCELLANEOUS) ×1
CONN ST 1/4X3/8 BEN (MISCELLANEOUS) ×1 IMPLANT
COVER PROBE W GEL 5X96 (DRAPES) ×2 IMPLANT
CRADLE DONUT ADULT HEAD (MISCELLANEOUS) ×2 IMPLANT
DERMABOND ADHESIVE PROPEN (GAUZE/BANDAGES/DRESSINGS) ×1
DERMABOND ADVANCED .7 DNX6 (GAUZE/BANDAGES/DRESSINGS) ×1 IMPLANT
DRAIN CHANNEL 28F RND 3/8 FF (WOUND CARE) ×4 IMPLANT
DRAPE CARDIOVASCULAR INCISE (DRAPES) ×1
DRAPE SLUSH/WARMER DISC (DRAPES) ×2 IMPLANT
DRAPE SRG 135X102X78XABS (DRAPES) ×1 IMPLANT
DRSG AQUACEL AG ADV 3.5X10 (GAUZE/BANDAGES/DRESSINGS) ×2 IMPLANT
DRSG AQUACEL AG ADV 3.5X14 (GAUZE/BANDAGES/DRESSINGS) ×2 IMPLANT
ELECT BLADE 4.0 EZ CLEAN MEGAD (MISCELLANEOUS) ×2
ELECT CAUTERY BLADE 6.4 (BLADE) ×4 IMPLANT
ELECT REM PT RETURN 9FT ADLT (ELECTROSURGICAL) ×4
ELECTRODE BLDE 4.0 EZ CLN MEGD (MISCELLANEOUS) ×1 IMPLANT
ELECTRODE REM PT RTRN 9FT ADLT (ELECTROSURGICAL) ×2 IMPLANT
FELT TEFLON 1X6 (MISCELLANEOUS) ×4 IMPLANT
FEMORAL VENOUS CANN RAP (CANNULA) ×2 IMPLANT
GLOVE BIO SURGEON STRL SZ 6.5 (GLOVE) ×10 IMPLANT
GLOVE BIO SURGEON STRL SZ7 (GLOVE) ×12 IMPLANT
GLOVE BIO SURGEON STRL SZ7.5 (GLOVE) ×6 IMPLANT
GLOVE BIOGEL PI IND STRL 6 (GLOVE) ×6 IMPLANT
GLOVE BIOGEL PI IND STRL 6.5 (GLOVE) ×6 IMPLANT
GLOVE BIOGEL PI IND STRL 7.0 (GLOVE) ×4 IMPLANT
GLOVE BIOGEL PI INDICATOR 6 (GLOVE) ×6
GLOVE BIOGEL PI INDICATOR 6.5 (GLOVE) ×6
GLOVE BIOGEL PI INDICATOR 7.0 (GLOVE) ×4
GOWN STRL REUS W/ TWL LRG LVL3 (GOWN DISPOSABLE) ×8 IMPLANT
GOWN STRL REUS W/TWL LRG LVL3 (GOWN DISPOSABLE) ×8
GRAFT WOVEN D/V 32DX30L (Vascular Products) ×2 IMPLANT
HANDLE STAPLE ENDO GIA SHORT (STAPLE) ×1
HEMOSTAT POWDER SURGIFOAM 1G (HEMOSTASIS) ×6 IMPLANT
HEMOSTAT SURGICEL 2X14 (HEMOSTASIS) ×2 IMPLANT
INSERT FOGARTY XLG (MISCELLANEOUS) ×2 IMPLANT
KIT BASIN OR (CUSTOM PROCEDURE TRAY) ×2 IMPLANT
KIT CATH SUCT 8FR (CATHETERS) IMPLANT
KIT DILATOR VASC 18G NDL (KITS) ×2 IMPLANT
KIT DRAINAGE VACCUM ASSIST (KITS) ×2 IMPLANT
KIT ROOM TURNOVER OR (KITS) ×2 IMPLANT
KIT SUCTION CATH 14FR (SUCTIONS) ×8 IMPLANT
LEAD PACING MYOCARDI (MISCELLANEOUS) ×2 IMPLANT
LINE VENT (MISCELLANEOUS) ×4 IMPLANT
LOOP VESSEL MAXI BLUE (MISCELLANEOUS) ×2 IMPLANT
LOOP VESSEL SUPERMAXI WHITE (MISCELLANEOUS) ×2 IMPLANT
NEEDLE AORTIC AIR ASPIRATING (NEEDLE) ×2 IMPLANT
NS IRRIG 1000ML POUR BTL (IV SOLUTION) ×12 IMPLANT
PACK OPEN HEART (CUSTOM PROCEDURE TRAY) ×2 IMPLANT
PAD ARMBOARD 7.5X6 YLW CONV (MISCELLANEOUS) ×4 IMPLANT
PENCIL BUTTON HOLSTER BLD 10FT (ELECTRODE) ×2 IMPLANT
RELOAD ENDO GIA 30 3.5 (STAPLE) ×2 IMPLANT
SEALANT SURG COSEAL 8ML (VASCULAR PRODUCTS) ×4 IMPLANT
SET CARDIOPLEGIA MPS 5001102 (MISCELLANEOUS) ×2 IMPLANT
SPONGE GAUZE 4X4 12PLY STER LF (GAUZE/BANDAGES/DRESSINGS) ×2 IMPLANT
SPONGE LAP 18X18 X RAY DECT (DISPOSABLE) ×4 IMPLANT
STAPLER ENDO GIA 12MM SHORT (STAPLE) ×1 IMPLANT
STOPCOCK 4 WAY LG BORE MALE ST (IV SETS) IMPLANT
SURGIFLO W/THROMBIN 8M KIT (HEMOSTASIS) ×2 IMPLANT
SUT ETHIBON 2 0 V 52N 30 (SUTURE) IMPLANT
SUT ETHIBOND 2 0 SH (SUTURE) ×1
SUT ETHIBOND 2 0 SH 36X2 (SUTURE) ×1 IMPLANT
SUT PROLENE 3 0 RB 1 (SUTURE) ×6 IMPLANT
SUT PROLENE 3 0 SH1 36 (SUTURE) ×28 IMPLANT
SUT PROLENE 4 0 RB 1 (SUTURE) ×6
SUT PROLENE 4-0 RB1 .5 CRCL 36 (SUTURE) ×6 IMPLANT
SUT PROLENE 5 0 C 1 36 (SUTURE) ×16 IMPLANT
SUT PROLENE 6 0 C 1 30 (SUTURE) ×4 IMPLANT
SUT SILK  1 MH (SUTURE) ×3
SUT SILK 1 MH (SUTURE) ×3 IMPLANT
SUT SILK 2 0 SH CR/8 (SUTURE) ×2 IMPLANT
SUT SILK 3 0 (SUTURE) ×1
SUT SILK 3-0 18XBRD TIE 12 (SUTURE) ×1 IMPLANT
SUT STEEL SZ 6 DBL 3X14 BALL (SUTURE) ×6 IMPLANT
SUT TEM PAC WIRE 2 0 SH (SUTURE) ×2 IMPLANT
SUT VIC AB 1 CTX 18 (SUTURE) ×4 IMPLANT
SUT VIC AB 3-0 SH 8-18 (SUTURE) ×4 IMPLANT
SUT VIC AB 3-0 X1 27 (SUTURE) ×2 IMPLANT
SUTURE E-PAK OPEN HEART (SUTURE) IMPLANT
SYR 10ML KIT SKIN ADHESIVE (MISCELLANEOUS) ×2 IMPLANT
SYSTEM SAHARA CHEST DRAIN ATS (WOUND CARE) ×2 IMPLANT
TAPE CLOTH SURG 4X10 WHT LF (GAUZE/BANDAGES/DRESSINGS) ×2 IMPLANT
TAPE PAPER 2X10 WHT MICROPORE (GAUZE/BANDAGES/DRESSINGS) ×2 IMPLANT
TOWEL OR 17X24 6PK STRL BLUE (TOWEL DISPOSABLE) ×4 IMPLANT
TOWEL OR 17X26 10 PK STRL BLUE (TOWEL DISPOSABLE) ×4 IMPLANT
TRAY FOLEY IC TEMP SENS 16FR (CATHETERS) ×2 IMPLANT
TUBE SUCT INTRACARD DLP 20F (MISCELLANEOUS) ×2 IMPLANT
TUBE SUCTION CARDIAC 10FR (CANNULA) ×2 IMPLANT
UNDERPAD 30X30 INCONTINENT (UNDERPADS AND DIAPERS) ×2 IMPLANT
VENT LEFT HEART 12002 (CATHETERS) ×2
WATER STERILE IRR 1000ML POUR (IV SOLUTION) ×4 IMPLANT

## 2015-08-05 NOTE — Anesthesia Postprocedure Evaluation (Addendum)
Anesthesia Post Note  Patient: [name] [name]  Procedure(s) Performed: Procedure(s) (LRB): REDO STERNOTOMY (N/A) REDO BENTALL PROCEDURE with circ arrest using a 32 Hemashield platinum graft.  (N/A) TRANSESOPHAGEAL ECHOCARDIOGRAM (TEE) (N/A)  Patient location during evaluation: PACU Anesthesia Type: General Level of consciousness: awake and alert Pain management: pain level controlled Vital Signs Assessment: post-procedure vital signs reviewed and stable Respiratory status: spontaneous breathing, nonlabored ventilation, respiratory function stable and patient connected to nasal cannula oxygen Cardiovascular status: blood pressure returned to baseline and stable Postop Assessment: no signs of nausea or vomiting Anesthetic complications: no Comments: ETT and PAC in good position, pt on dopamine and phenylephrine low dose support, DOO pacing and doing well immediately post op and will likely be able to be weaned quickly from vent support given age and minimal comorbities, amiodarone was also started prior to bypass due to episode of ventricular fibrillation, goal is 1mg /hr for first 6 hours then 0.5mg /hr for next 18 hours then convert to PO if needed    Last Vitals:  Filed Vitals:   08/05/15 2000 08/05/15 2015  BP: 91/71   Pulse: 80 80  Temp: 36.2 C 36.3 C  Resp: 12 12    Last Pain: There were no vitals filed for this visit.               Zenaida Deed

## 2015-08-05 NOTE — Progress Notes (Signed)
  Echocardiogram Echocardiogram Transesophageal has been performed.  [name] [name] 08/05/2015, 8:35 AM

## 2015-08-05 NOTE — OR Nursing (Signed)
Twenty minute call to SICU charge nurse at [phone].

## 2015-08-05 NOTE — Transfer of Care (Signed)
Immediate Anesthesia Transfer of Care Note  Patient: [name] [name]  Procedure(s) Performed: Procedure(s) with comments: REDO STERNOTOMY (N/A) REDO BENTALL PROCEDURE with circ arrest using a 32 Hemashield platinum graft.  (N/A) - POSSIBLE CIRC ARREST TRANSESOPHAGEAL ECHOCARDIOGRAM (TEE) (N/A)  Patient Location: SICU  Anesthesia Type:General  Level of Consciousness: Patient remains intubated per anesthesia plan  Airway & Oxygen Therapy: Patient remains intubated per anesthesia plan  Post-op Assessment: Report given to RN and Post -op Vital signs reviewed and stable  Post vital signs: Reviewed and stable  Last Vitals:  Filed Vitals:   08/05/15 0555 08/05/15 1817  BP: 151/96   Pulse: 63 80  Temp: 36.7 C 36.1 C  Resp: 20 12    Complications: No apparent anesthesia complications

## 2015-08-05 NOTE — Anesthesia Procedure Notes (Signed)
Procedure Name: Intubation Date/Time: 08/05/2015 7:58 AM Performed by: Clearnce Sorrel Pre-anesthesia Checklist: Patient identified, Timeout performed, Emergency Drugs available, Suction available and Patient being monitored Patient Re-evaluated:Patient Re-evaluated prior to inductionOxygen Delivery Method: Circle system utilized Preoxygenation: Pre-oxygenation with 100% oxygen Intubation Type: IV induction Ventilation: Mask ventilation without difficulty Laryngoscope Size: Mac and 4 Grade View: Grade II Tube type: Subglottic suction tube Tube size: 8.0 mm Number of attempts: 1 Airway Equipment and Method: Stylet Placement Confirmation: ETT inserted through vocal cords under direct vision,  breath sounds checked- equal and bilateral and positive ETCO2 Secured at: 23 cm Tube secured with: Tape Dental Injury: Teeth and Oropharynx as per pre-operative assessment

## 2015-08-05 NOTE — H&P (Signed)
[address]       [place] [address]             [phone]                    Garan Rule Brimhall Nizhoni Medical Record [id number] Date of Birth: February 19, 1971  Referring: No ref. provider found Primary Care: No PCP Per Patient Cardiology: Dr. Jason Fila Endocrinology: Iran Planas MD  Chief Complaint:    No chief complaint on file.   History of Present Illness:   Patient returns to [facility]  today for  replacement of his ascending aorta. He is recovered  well following his left hip replacement. Since he was last seen he is also seen by Dr. Jerelene Redden in [facility] for second opinion regarding surgical repair of his ascending aorta.  He was originally  is seen in the office  because of the incidental discovery of a dilated descending aorta prior to hip replacement.  His cardiac history starts at age 55 when he had critical aortic stenosis and underwent open aortic valve commissurotomy in [place]. Subsequently he presented in September 2003 with critical aortic stenosis and  aortic insufficiency and underwent aortic valve replacement with a #23 St. Jude mechanical valve model #23 AGN- 751, serial [id number]. At the time of surgery Dr. 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 The remainder of the Ascendingorta was not noted to be enlarged. The patient has been clinically stable since that time on Coumadin, with the exception of a brief period early postop when he stopped taking his Coumadin and had a minor stroke. 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 In March 21, 2015 CT scan of the chest was performed demonstrating a 5.5 cm dilatation of the ascending aorta. In addition there was a 4 mm left lower lobe lung nodule, too small to characterize.    The patient has been seen by cardiology, Dr. Elonda Husky. The  patient reports that a stress echocardiogram was performed. Several weeks ago cardiac cath was done in [place]  The patient has no family history of aortic disease, aortic dissection for aortic aneurysm. His father died in 2023-03-21 of this year at age 86 with on dialysis and with congestive heart failure, his mother is alive at age 110 with congestive heart failure and COPD she did have a myocardial infarction at age 22. He has one brother with no medical history. He denies any history of premature cath or history of aortic aneurysms or dissections in any uncles her cousins.  He has had hip replacement done and made good recovery ambulating. Cardiac cath done last week in [place], Coronaries were clear.   Current Activity/ Functional Status:  Patient is independent with mobility/ambulation, transfers, ADL's, IADL's.   Zubrod Score: At the time of surgery this patient's most appropriate activity status/level should be described as: [x]     0    Normal activity, no symptoms []     1    Restricted in physical strenuous activity but ambulatory,  able to do out light work []     2    Ambulatory and capable of self care, unable to do work activities, up and about               >50 % of waking hours                              []     3    Only limited self care, in bed greater than 50% of waking hours []     4    Completely disabled, no self care, confined to bed or chair []     5    Moribund   Past Medical History  Diagnosis Date  . Cancer (Comb[place])     thyroid  . Aortic stenosis   . Long term current use of anticoagulant 12/03/2014  . H/O aortic valve replacement 12/03/2014  . HLD (hyperlipidemia)     was on Crestor but has been  off for months(oct 2016)  . Degenerative arthritis of hip 03/17/2011  . 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 Thoracic aortic aneurysm, without rupture ([place]) 05/01/2015  . Incidental pulmonary nodule, > 10mm and < 49mm left lower lobe 05/01/2015  . H/O bicuspid aortic valve 05/01/2015  . Complication of anesthesia   . PONV (postoperative nausea and vomiting)     nausea  . S/P thyroidectomy   . BP (high blood pressure) 12/19/2014    takes Lisinopril and Metoprolol daily  . Hypothyroidism     takes Synthroid daily  . GERD (gastroesophageal reflux disease)     takes Zantac daily  . Environmental allergies     Albuterol inhaler as needed  . History of bronchitis   . Vertigo     d//t inner ear  . TIA (transient ischemic attack)     Past Surgical History  Procedure Laterality Date  . Cardiac valve surgery  at age 67/at age 89    AVReplacement 2003. Dr Berline Lopes in [facility]  . Thyroidectomy      12/24/2014, Dr Meredith Leeds [facility]  . Total hip arthroplasty Left 05/10/2015    Procedure: LEFT TOTAL HIP ARTHROPLASTY ANTERIOR APPROACH;  Surgeon: Mcarthur Rossetti, MD;  Location: [facility] ORS;  Service: Orthopedics;  Laterality: Left;    Family History  Problem Relation Age of Onset  . Heart disease Mother     before age 36  . Heart disease Father     before age 33    Social History   Social History  . Marital Status: Married    Spouse Name: N/A  . Number of Children: N/A  . Years of Education: N/A   Occupational History  . Not on file.   Social History Main Topics  . Smoking status: Never Smoker   . Smokeless tobacco: Not on file  . Alcohol Use: No  . Drug Use: No  . Sexual Activity: Yes   Other Topics Concern  . Not on file   Social History Narrative    History  Smoking status  . Never Smoker   Smokeless tobacco  . Not on file    History  Alcohol Use No     No Known Allergies  Current Facility-Administered Medications  Medication Dose Route  Frequency Provider Last Rate Last Dose  . aminocaproic acid (AMICAR) 10 g in sodium chloride 0.9 % 100 mL infusion   Intravenous To OR Kris Mouton, RPH      .  cefUROXime (ZINACEF) 1.5 g in dextrose 5 % 50 mL IVPB  1.5 g Intravenous To OR [name], MD      . cefUROXime (ZINACEF) 750 mg in dextrose 5 % 50 mL IVPB  750 mg Intravenous To OR Kris Mouton, RPH      . dexmedetomidine (PRECEDEX) 400 MCG/100ML (4 mcg/mL) infusion  0.1-0.7 mcg/kg/hr Intravenous To OR Kris Mouton, RPH      . DOPamine (INTROPIN) 800 mg in dextrose 5 % 250 mL (3.2 mg/mL) infusion  0-10 mcg/kg/min Intravenous To OR Kris Mouton, RPH      . EPINEPHrine (ADRENALIN) 4 mg in dextrose 5 % 250 mL (0.016 mg/mL) infusion  0-10 mcg/min Intravenous To OR Kris Mouton, RPH      . heparin 2,500 Units, papaverine 30 mg in electrolyte-148 (PLASMALYTE-148) 500 mL irrigation   Irrigation To OR Kris Mouton, RPH      . heparin 30,000 units/NS 1000 mL solution for CELLSAVER   Other To OR Kris Mouton, RPH      . insulin regular (NOVOLIN R,HUMULIN R) 250 Units in sodium chloride 0.9 % 250 mL (1 Units/mL) infusion   Intravenous To OR Kris Mouton, RPH      . magnesium sulfate (IV Push/IM) injection 40 mEq  40 mEq Other To OR Kris Mouton, RPH      . metoprolol tartrate (LOPRESSOR) tablet 12.5 mg  12.5 mg Oral Once [name], MD   12.5 mg at 08/05/15 0618  . nitroGLYCERIN 50 mg in dextrose 5 % 250 mL (0.2 mg/mL) infusion  2-200 mcg/min Intravenous To OR Kris Mouton, RPH      . phenylephrine (NEO-SYNEPHRINE) 20 mg in dextrose 5 % 250 mL (0.08 mg/mL) infusion  30-200 mcg/min Intravenous To OR Kris Mouton, RPH      . potassium chloride injection 80 mEq  80 mEq Other To OR Kris Mouton, [facility]      . vancomycin (VANCOCIN) 1,500 mg in sodium chloride 0.9 % 250 mL IVPB  1,500 mg Intravenous To OR [name], MD       Facility-Administered Medications Ordered in Other Encounters  Medication Dose Route  Frequency Provider Last Rate Last Dose  . fentaNYL (SUBLIMAZE) injection    Anesthesia Intra-op Clearnce Sorrel, CRNA   50 mcg at 08/05/15 0700  . lactated ringers infusion    Continuous PRN Clearnce Sorrel, CRNA      . lactated ringers infusion    Continuous PRN Clearnce Sorrel, CRNA      . midazolam (VERSED) 5 MG/5ML injection    Anesthesia Intra-op Clearnce Sorrel, CRNA   1 mg at 08/05/15 0700       Review of Systems:     Cardiac Review of Systems: Y or N  Chest Pain [ N   ]  Resting SOB [ N  ] Exertional SOB  [ N ]  Orthopnea Aqua.Slicker  ]   Pedal Edema [  N ]    Palpitations [ N ] Syncope  [  N]   Presyncope Aqua.Slicker   ]  General Review of Systems: [Y] = yes [  ]=no Constitional: recent weight change Aqua.Slicker  ];  Wt loss over the last 3 months [   ] anorexia [ n ]; fatigue Aqua.Slicker  ]; nausea [  ]; night sweats [  ]; fever [ N ]; or chills Aqua.Slicker  ];  Dental: poor dentition[ N ]; Last Dentist visit:   Eye : blurred vision [  ]; diplopia [   ]; vision changes [  ];  Amaurosis fugax[  ]; Resp: cough [  ];  wheezing[  ];  hemoptysis[  ]; shortness of breath[  ]; paroxysmal nocturnal dyspnea[  ]; dyspnea on exertion[  ]; or orthopnea[  ];  GI:  gallstones[  ], vomiting[  ];  dysphagia[  ]; melena[  ];  hematochezia [  ]; heartburn[  ];   Hx of  Colonoscopy[  ]; GU: kidney stones [  ]; hematuria[  ];   dysuria [  ];  nocturia[  ];  history of     obstruction [  ]; urinary frequency [  ]             Skin: rash, swelling[  ];, hair loss[  ];  peripheral edema[  ];  or itching[  ]; Musculosketetal: myalgias[  ];  joint swelling[  ];  joint erythema[  ];  joint pain[Y  ];  back pain[  ];  Heme/Lymph: bruising[  ];  bleeding[  ];  anemia[  ];  Neuro: TIA[ Y ];  headaches[  ];  stroke[  ];  vertigo[  ];  seizures[  ];   paresthesias[  ];  difficulty walking[  ];  Psych:depression[  ]; anxiety[  ];  Endocrine: diabetes[N  ];  thyroid dysfunction[N  ];  Immunizations: Flu up to date Jazmín.Cullens  ]; Pneumococcal up to date Aqua.Slicker   ];  Other:  Physical Exam: BP 151/96 mmHg  Pulse 63  Temp(Src) 98.1 F (36.7 C) (Oral)  Resp 20  Wt 209 lb (94.802 kg)  SpO2 100%  PHYSICAL EXAMINATION: General appearance: alert, cooperative, appears stated age and no distress Head: Normocephalic, without obvious abnormality, atraumatic Neck: no adenopathy, no carotid bruit, no JVD, supple, symmetrical, trachea midline and PATIENT'S MIDLINE CERVICAL INCISION FROM HIS THYROIDECTOMIES HEALING WELL SOME SWELLING OF THE NECK GREATER ON THE RIGHT THAN THE LEFT Lymph nodes: Cervical, supraclavicular, and axillary nodes normal. Resp: clear to auscultation bilaterally Back: symmetric, no curvature. ROM normal. No CVA tenderness. Cardio: regular rate and rhythm, S1, S2 normal, no murmur, click, rub or gallop and VALVE SOUNDS/VALVE CLICK IS CLEAR AND CRISP, NO MURMUR OF AORTIC INSUFFICIENCY GI: soft, non-tender; bowel sounds normal; no masses,  no organomegaly Extremities: extremities normal, atraumatic, no cyanosis or edema and Homans sign is negative, no sign of DVT Neurologic: Grossly normal  Diagnostic Studies & Laboratory data:     Recent Radiology Findings:   CT scan is in a PACS system: No findings specific for metastatic disease in the chest, 4 x 3 mm left lower lobe pulmonary nodule nonspecific, no correlating image finding in the right hemothorax noted on the I-131 scan there is evidence of aortic valve replacement associated 5.5 cm ascending thoracic aortic aneurysm     I have independently reviewed the above radiology studies  and reviewed the findings with the patient.   Recent Lab Findings: Lab Results  Component Value Date   WBC 5.1 08/01/2015   HGB 13.6 08/01/2015   HCT 40.5 08/01/2015   PLT 247 08/01/2015   GLUCOSE 113* 08/01/2015   ALT 15* 08/01/2015   AST 24 08/01/2015   NA 139 08/01/2015   K 3.9 08/01/2015   CL 108 08/01/2015   CREATININE 0.86 08/01/2015   BUN 12 08/01/2015   CO2 22 08/01/2015   INR  1.09 08/05/2015   HGBA1C  5.5 08/01/2015    Aortic Size Index=     5.5    /There is no height on file to calculate BSA. = 2.55  < 2.75 cm/m2      4% risk per year 2.75 to 4.25          8% risk per year > 4.25 cm/m2    20% risk per year  Cardiac Cath: Procedure  Procedure Type  Diagnostic procedure:  Complications: No Complications.  Conclusions  Diagnostic Procedure Summary  Normal coronary arteries.  Normally functioning bileaflet mechanical AV prosthesis  Prominent thoracic aortic aneurysm noted on aortography  I have reviewed the recent history and physical documentation. I personally  spent 15 minutes continuously monitoring the patient during the administration  of moderate sedation. Pre and post activities have been reviewed. I was  present for the entire procedure.  Diagnostic Procedure Recommendations  Refer to CTS  Signatures  Electronically signed by Baxter Hire, MD, FACC(Diagnostic  Physician) on 07/17/2015 09:01  Angiographic findings  Cardiac Arteries and Lesion Findings  LMCA: Normal.  LAD: Normal.  LCx: Normal.  RCA: Normal.  Ramus: Normal.  Procedure Data  Procedure Date  Date: 07/17/2015 Start: 08:32  Contrast Material  - Omnipaque89 ml  Medical History  Allergies  - NKA allergy.  Admission Data  Admission Date: 07/17/2015  Admission Status: Outpatient.  Hemodynamics  Condition: Rest  Heart Rate: 63 bpm  Pressures  +-----+-------------+  !Site !Pressure !  +-----+-------------+  !AO !191/123 (160)!  +-----+-------------+  !AO !249/149 (158)!  +-----+-------------+  !AO !117/75 (95)!  +-----+-------------+  !AO !107/69 (88)!  +-----+-------------+      Assessment / Plan:    Mid ascending aorta dilated to 5.5 cm in patient with previous history of bicuspid aortic valve and stenosis replaced with a mechanical valve and repair of pseudo-aneurysm of previous aortotomy-  2003 adequate anticoagulation on Coumadin chronically because of mechanical aortic valve prosthesis I have reviewed with the patient and his wife in depth the significance of the finding of dilated ascending aorta to 5.5 cm. He is aware that he is at increased risk of aortic rupture or dissection because of this dilatation. I reviewed with him that at 5.5 cm in an otherwise suitable risk patient for surgery the recommendation would be to proceed with elective replacement of the ascending aorta and possible root replacement. If valve replaced he is agreeble to continuing on coumadin. Use of circulatory arrest has been discussed. Possible need for pacemaker discussed. He has had cardiac catheterization done.  He will need follow-up CT scan in the future for the left lower lobe 4 mm pulmonary nodule   The goals risks and alternatives of the planned surgical procedure Redo sternotomy reverse with replacement of ascending aorta and possible root replacement and possible circulatory arrest have been discussed with the patient in detail. The risks of the procedure including death, infection, stroke, myocardial infarction, bleeding, heart block and need for permanent pacemaker,  blood transfusion have all been discussed specifically.  I have quoted Colin Mulders a 6 % of perioperative mortality and a complication rate as high as 40 %. The patient's questions have been answered.Ericka Antosh is willing  to proceed with the planned procedure.  In addition to other potential risks and complications from the surgery, I have made the patient aware of the recent [place] concerning the risk of infection by Myocobacterium chimaera related to the use of Stockert 3T heater-cooler equipment during cardiac surgery. I discussed with the patient  the low risk of infection, as well as our compliance with the most current FDA recommendations to minimize infection and testing of all devices for contamination. The patient  has been made aware of the limited alternatives to immediately replacing the current equipment. The patient has been informed regarding the risks associated with waiting to proceed with needed surgery and that such risks are greater than the risk of infection related to the use of the heater-cooler device. I did make the patient aware that after careful review of the patients having cardiac surgery at [facility] we have no evidence that heater/cooler related infections have occurred at [facility]. We discussed that this is a slow-growing bacterium, such that it can take some period of time for symptoms to develop.    The [employer] of [employer] [facility]) and the [facility]) have issued a statement to clarify 2 previous guidelines from the [facility], [place], and collaborating societies addressing the risk of aortic dissection in patients with bicuspid aortic valves (BAV) and severe aortic enlargement. The 2 guidelines differ with regard to the recommended threshold of aortic root or ascending aortic dilatation that would justify surgical intervention in patients with bicuspid aortic valves. This new statement of clarification uses the ACC/AHA revised structure for delineating the Class of Recommendation and Level of Evidence to provide recommendation that replace those contained in Section 9.2.2.1 of the thoracic aortic disease guidelines and Section 5.1.3 of the valvular heart disease guideline. New recommendations in intervention in patients with BAV and dilatation of the aortic root (sinuses) or ascending aorta include:  . Operative intervention to repair or replace the aortic root (sinuses) or replace the ascending aorta is indicated in asymptomatic patients with BAV if the diameter of the aortic root or ascending aorta is 5.5 cm or greater. (Class of recommendation 1, Level of evidence B-NR).  Marland Kitchen Operative intervention to repair or replace the aortic root (sinuses) or replace the ascending  aorta is reasonable in asymptomatic patients with BAV if the diameter of the aortic root or ascending aorta is 5.0 cm or greater and an additional risk factor for dissection is present or if the patient is at low surgical risk and the surgery is performed by an experienced aortic surgical team in a center with established expertise in these procedures. (Class of recommendation IIa; Level of Evidence B-NR).  . Replacement of the ascending aorta is reasonable in patients with BAV undergoing AVR because of severe aortic stenosis or aortic regurgitation when the diameter of the ascending aorta is greater than 4.5 cm (Class of recommendation IIa; Level of evidence C-EO).  Citation: Donnamarie Poag, Randolm Idol, et al. Surgery for aortic dilatation in patients with bicuspid aortic valves. A statement of clarification from the [employer]/American Heart Association Task Force on Clinical Practice Guidelines. [Published online ahead of print April 13, 2014]. Circulation. doi: 10.1161/CIR.0000000000000331.    [name] MD      [address] [place] [address] Office [phone]   Beeper [phone]  08/05/2015 7:08 AM

## 2015-08-05 NOTE — OR Nursing (Signed)
On the way call made to SICU @1758 , plans for transfer to Room 16 postoperatively.

## 2015-08-05 NOTE — Brief Op Note (Signed)
08/05/2015  4:59 PM  PATIENT:  Colin Mulders  45 y.o. male  PRE-OPERATIVE DIAGNOSIS:  TAA   POST-OPERATIVE DIAGNOSIS:  TAA  PROCEDURE:  Procedure(s) with comments: REDO STERNOTOMY (N/A) REDO BENTALL PROCEDURE with circ arrest using a 32 Hemashield platinum graft.  (N/A) - POSSIBLE CIRC ARREST TRANSESOPHAGEAL ECHOCARDIOGRAM (TEE) (N/A)  SURGEON:  Surgeon(s) and Role:    * [name], MD - Primary  PHYSICIAN ASSISTANT:WAYNE GOLD PA-C  ANESTHESIA:   general  EBL:  Total I/O In: 2000 [I.V.:2000] Out: 5000 [Urine:5000]  BLOOD ADMINISTERED:SEE ANEST/PERFUSION RECORDS  DRAINS: CHEST TUBES   LOCAL MEDICATIONS USED:  NONE  SPECIMEN:  Source of Specimen:  AORTIC ANEURYSM  DISPOSITION OF SPECIMEN:  PATHOLOGY  COUNTS:  YES  TOURNIQUET:  * No tourniquets in log *  DICTATION: .Other Dictation: Dictation Number PENDING  PLAN OF CARE: Admit to inpatient   PATIENT DISPOSITION:  ICU - intubated and hemodynamically stable.   Delay start of Pharmacological VTE agent (>24hrs) due to surgical blood loss or risk of bleeding: yes

## 2015-08-05 NOTE — OR Nursing (Signed)
Forty-five minute call to SICU charge nurse at 1649.

## 2015-08-06 ENCOUNTER — Inpatient Hospital Stay (HOSPITAL_COMMUNITY): Payer: PRIVATE HEALTH INSURANCE

## 2015-08-06 ENCOUNTER — Encounter (HOSPITAL_COMMUNITY): Payer: Self-pay | Admitting: Cardiothoracic Surgery

## 2015-08-06 LAB — TYPE AND SCREEN
ABO/RH(D): A NEG
Antibody Screen: NEGATIVE
Unit division: 0
Unit division: 0
Unit division: 0
Unit division: 0

## 2015-08-06 LAB — GLUCOSE, CAPILLARY
Glucose-Capillary: 104 mg/dL — ABNORMAL HIGH (ref 65–99)
Glucose-Capillary: 106 mg/dL — ABNORMAL HIGH (ref 65–99)
Glucose-Capillary: 107 mg/dL — ABNORMAL HIGH (ref 65–99)
Glucose-Capillary: 116 mg/dL — ABNORMAL HIGH (ref 65–99)
Glucose-Capillary: 119 mg/dL — ABNORMAL HIGH (ref 65–99)
Glucose-Capillary: 119 mg/dL — ABNORMAL HIGH (ref 65–99)
Glucose-Capillary: 119 mg/dL — ABNORMAL HIGH (ref 65–99)
Glucose-Capillary: 122 mg/dL — ABNORMAL HIGH (ref 65–99)
Glucose-Capillary: 123 mg/dL — ABNORMAL HIGH (ref 65–99)
Glucose-Capillary: 125 mg/dL — ABNORMAL HIGH (ref 65–99)
Glucose-Capillary: 126 mg/dL — ABNORMAL HIGH (ref 65–99)
Glucose-Capillary: 127 mg/dL — ABNORMAL HIGH (ref 65–99)
Glucose-Capillary: 128 mg/dL — ABNORMAL HIGH (ref 65–99)
Glucose-Capillary: 128 mg/dL — ABNORMAL HIGH (ref 65–99)
Glucose-Capillary: 128 mg/dL — ABNORMAL HIGH (ref 65–99)
Glucose-Capillary: 130 mg/dL — ABNORMAL HIGH (ref 65–99)
Glucose-Capillary: 131 mg/dL — ABNORMAL HIGH (ref 65–99)
Glucose-Capillary: 132 mg/dL — ABNORMAL HIGH (ref 65–99)
Glucose-Capillary: 133 mg/dL — ABNORMAL HIGH (ref 65–99)
Glucose-Capillary: 140 mg/dL — ABNORMAL HIGH (ref 65–99)
Glucose-Capillary: 141 mg/dL — ABNORMAL HIGH (ref 65–99)
Glucose-Capillary: 146 mg/dL — ABNORMAL HIGH (ref 65–99)

## 2015-08-06 LAB — CBC
HCT: 38.6 % — ABNORMAL LOW (ref 39.0–52.0)
HEMATOCRIT: 34.1 % — AB (ref 39.0–52.0)
Hemoglobin: 13.4 g/dL (ref 13.0–17.0)
Hemoglobin: 11.5 g/dL — ABNORMAL LOW (ref 13.0–17.0)
MCH: 29.6 pg (ref 26.0–34.0)
MCH: 29.1 pg (ref 26.0–34.0)
MCHC: 34.7 g/dL (ref 30.0–36.0)
MCHC: 33.7 g/dL (ref 30.0–36.0)
MCV: 85.4 fL (ref 78.0–100.0)
MCV: 86.3 fL (ref 78.0–100.0)
PLATELETS: 124 10*3/uL — AB (ref 150–400)
Platelets: 123 10*3/uL — ABNORMAL LOW (ref 150–400)
RBC: 4.52 MIL/uL (ref 4.22–5.81)
RBC: 3.95 MIL/uL — ABNORMAL LOW (ref 4.22–5.81)
RDW: 13.5 % (ref 11.5–15.5)
RDW: 13.9 % (ref 11.5–15.5)
WBC: 14.4 10*3/uL — AB (ref 4.0–10.5)
WBC: 16.9 10*3/uL — ABNORMAL HIGH (ref 4.0–10.5)

## 2015-08-06 LAB — POCT I-STAT 3, ART BLOOD GAS (G3+)
Acid-base deficit: 3 mmol/L — ABNORMAL HIGH (ref 0.0–2.0)
Bicarbonate: 23.5 mEq/L (ref 20.0–24.0)
Bicarbonate: 23.3 mEq/L (ref 20.0–24.0)
O2 Saturation: 97 %
O2 Saturation: 97 %
Patient temperature: 36.9
Patient temperature: 37
TCO2: 25 mmol/L (ref 0–100)
TCO2: 25 mmol/L (ref 0–100)
pCO2 arterial: 35.6 mmHg (ref 35.0–45.0)
pCO2 arterial: 46.8 mmHg — ABNORMAL HIGH (ref 35.0–45.0)
pH, Arterial: 7.428 (ref 7.350–7.450)
pH, Arterial: 7.305 — ABNORMAL LOW (ref 7.350–7.450)
pO2, Arterial: 88 mmHg (ref 80.0–100.0)
pO2, Arterial: 97 mmHg (ref 80.0–100.0)

## 2015-08-06 LAB — MAGNESIUM
Magnesium: 2.9 mg/dL — ABNORMAL HIGH (ref 1.7–2.4)
Magnesium: 2.1 mg/dL (ref 1.7–2.4)
Magnesium: 2.1 mg/dL (ref 1.7–2.4)

## 2015-08-06 LAB — BASIC METABOLIC PANEL
ANION GAP: 9 (ref 5–15)
BUN: 9 mg/dL (ref 6–20)
CALCIUM: 7 mg/dL — AB (ref 8.9–10.3)
CO2: 22 mmol/L (ref 22–32)
Chloride: 110 mmol/L (ref 101–111)
Creatinine, Ser: 0.9 mg/dL (ref 0.61–1.24)
GFR calc Af Amer: >60 mL/min (ref >60)
GLUCOSE: 141 mg/dL — AB (ref 65–99)
Potassium: 4.1 mmol/L (ref 3.5–5.1)
SODIUM: 141 mmol/L (ref 135–145)

## 2015-08-06 LAB — POCT I-STAT, CHEM 8
BUN: 15 mg/dL (ref 6–20)
Calcium, Ion: 0.88 mmol/L — ABNORMAL LOW (ref 1.12–1.23)
Chloride: 100 mmol/L — ABNORMAL LOW (ref 101–111)
Creatinine, Ser: 0.9 mg/dL (ref 0.61–1.24)
Glucose, Bld: 141 mg/dL — ABNORMAL HIGH (ref 65–99)
HCT: 36 % — ABNORMAL LOW (ref 39.0–52.0)
Hemoglobin: 12.2 g/dL — ABNORMAL LOW (ref 13.0–17.0)
Potassium: 4 mmol/L (ref 3.5–5.1)
Sodium: 138 mmol/L (ref 135–145)
TCO2: 23 mmol/L (ref 0–100)

## 2015-08-06 LAB — CREATININE, SERUM
Creatinine, Ser: 0.87 mg/dL (ref 0.61–1.24)
Creatinine, Ser: 1.02 mg/dL (ref 0.61–1.24)
GFR calc Af Amer: >60 mL/min (ref >60)
GFR calc non Af Amer: >60 mL/min (ref >60)
GFR calc non Af Amer: >60 mL/min (ref >60)

## 2015-08-06 MED ORDER — WARFARIN - PHYSICIAN DOSING INPATIENT
Freq: Every day | Status: DC
  Administered 2015-08-10: 18:00:00

## 2015-08-06 MED ORDER — WARFARIN SODIUM 2.5 MG PO TABS
2.5000 mg | ORAL_TABLET | Freq: Once | ORAL | Status: AC
  Administered 2015-08-06: 2.5 mg via ORAL
  Filled 2015-08-06: qty 1

## 2015-08-06 MED ORDER — ENOXAPARIN SODIUM 40 MG/0.4ML ~~LOC~~ SOLN
40.0000 mg | Freq: Every day | SUBCUTANEOUS | Status: DC
  Administered 2015-08-06 – 2015-08-11 (×6): 40 mg via SUBCUTANEOUS
  Filled 2015-08-06 (×6): qty 0.4

## 2015-08-06 MED ORDER — INSULIN ASPART 100 UNIT/ML ~~LOC~~ SOLN
0.0000 [IU] | SUBCUTANEOUS | Status: DC
  Administered 2015-08-06 (×2): 2 [IU] via SUBCUTANEOUS

## 2015-08-06 NOTE — Progress Notes (Signed)
Patient ID: [name] [name], male   DOB: [birth year], 45 y.o.   MRN: [id number] TCTS DAILY ICU PROGRESS NOTE                   [address]            [place] [address]          [phone]   1 Day Post-Op Procedure(s) (LRB): REDO STERNOTOMY (N/A) REDO BENTALL PROCEDURE with circ arrest using a 32 Hemashield platinum graft.  (N/A) TRANSESOPHAGEAL ECHOCARDIOGRAM (TEE) (N/A)  Total Length of Stay:  LOS: 1 day   Subjective: Awake and alert, neuro intact, nausea during the night  Objective: Vital signs in last 24 hours: Temp:  [96.4 F (35.8 C)-98.8 F (37.1 C)] 98.4 F (36.9 C) (03/28 0700) Pulse Rate:  [72-89] 80 (03/28 0700) Cardiac Rhythm:  [-] Atrial paced (03/28 0200) Resp:  [11-24] 24 (03/28 0700) BP: (88-103)/(59-74) 100/59 mmHg (03/28 0700) SpO2:  [94 %-100 %] 99 % (03/28 0700) Arterial Line BP: (86-116)/(52-73) 107/59 mmHg (03/28 0700) FiO2 (%):  [40 %-50 %] 40 % (03/28 0040) Weight:  [207 lb 6 oz (94.065 kg)] 207 lb 6 oz (94.065 kg) (03/28 0500)  Filed Weights   08/05/15 0555 08/06/15 0500  Weight: 209 lb (94.802 kg) 207 lb 6 oz (94.065 kg)    Weight change: -1 lb 10 oz (-0.737 kg)   Hemodynamic parameters for last 24 hours: PAP: (23-30)/(16-21) 24/19 mmHg CO:  [2.9 L/min-4.9 L/min] 4.9 L/min CI:  [1.4 L/min/m2-2.3 L/min/m2] 2.3 L/min/m2  Intake/Output from previous day: 03/27 0701 - 03/28 0700 In: 8449.2 [I.V.:5960.2; Blood:1389; IV Piggyback:1100] Out: 40973 [Urine:8785; Blood:1755; Chest Tube:178]  Intake/Output this shift:    Current Meds: Scheduled Meds: . acetaminophen  1,000 mg Oral 4 times per day   Or  . acetaminophen (TYLENOL) oral liquid 160 mg/5 mL  1,000 mg Per Tube 4 times per day  . antiseptic oral rinse  7 mL Mouth Rinse QID  . aspirin EC  325 mg Oral Daily   Or  . aspirin  324 mg Per Tube Daily  . bisacodyl  10 mg Oral Daily   Or  . bisacodyl  10 mg Rectal Daily  . cefUROXime (ZINACEF)  IV  1.5 g Intravenous Q12H    . chlorhexidine gluconate (SAGE KIT)  15 mL Mouth Rinse BID  . docusate sodium  200 mg Oral Daily  . famotidine (PEPCID) IV  20 mg Intravenous Q12H  . insulin regular  0-10 Units Intravenous TID WC  . levothyroxine  175 mcg Oral QAC breakfast  . metoprolol tartrate  12.5 mg Oral BID   Or  . metoprolol tartrate  12.5 mg Per Tube BID  . [START ON 08/07/2015] pantoprazole  40 mg Oral Daily  . sodium chloride flush  3 mL Intravenous Q12H  . vancomycin  1,000 mg Intravenous Once   Continuous Infusions: . sodium chloride 10 mL/hr at 08/06/15 0600  . sodium chloride    . sodium chloride 10 mL/hr at 08/05/15 1837  . amiodarone 30 mg/hr (08/06/15 0600)  . dexmedetomidine 0 mcg/kg/hr (08/05/15 2142)  . DOPamine 3 mcg/kg/min (08/06/15 0600)  . insulin (NOVOLIN-R) infusion 2 Units/hr (08/06/15 0600)  . lactated ringers 20 mL/hr at 08/06/15 0600  . lactated ringers 10 mL/hr at 08/06/15 0600  . nitroGLYCERIN Stopped (08/05/15 1838)  . phenylephrine (NEO-SYNEPHRINE) Adult infusion 35 mcg/min (08/06/15 0600)   PRN Meds:.sodium chloride, albumin human, lactated ringers, metoprolol, midazolam, morphine injection, ondansetron (  ZOFRAN) IV, oxyCODONE, sodium chloride flush, traMADol  General appearance: alert and cooperative Neurologic: intact Heart: regular rate and rhythm, S1, S2 normal, no murmur, click, rub or gallop Lungs: diminished breath sounds bibasilar Abdomen: soft, non-tender; bowel sounds normal; no masses,  no organomegaly Extremities: extremities normal, atraumatic, no cyanosis or edema and Homans sign is negative, no sign of DVT Wound: sternum intact  Lab Results: CBC: Recent Labs  08/05/15 2318 08/06/15 0411  WBC 14.6* 14.4*  HGB 14.0 13.4  HCT 41.8 38.6*  PLT 121* 124*   BMET:  Recent Labs  08/05/15 2317 08/05/15 2318 08/06/15 0411  NA 142  --  141  K 4.1  --  4.1  CL 104  --  110  CO2  --   --  22  GLUCOSE 149*  --  141*  BUN 7  --  9  CREATININE 0.70 0.87  0.90  CALCIUM  --   --  7.0*    PT/INR:  Recent Labs  08/05/15 1822  LABPROT 17.7*  INR 1.45   Radiology: Dg Chest Port 1 View  08/06/2015  CLINICAL DATA:  Status post redo sternotomy EXAM: PORTABLE CHEST 1 VIEW COMPARISON:  08/05/2015 FINDINGS: Endotracheal tube and nasogastric catheter have been removed. Swan-Ganz catheter mediastinal drain remain in place. No pneumothorax is seen. Minimal bibasilar atelectasis is noted. Cardiac shadow is stable. IMPRESSION: Minimal basilar atelectasis. Electronically Signed   By: Inez Catalina M.D.   On: 08/06/2015 07:26   Dg Chest Port 1 View  08/05/2015  CLINICAL DATA:  Status post thoracic aortic aneurysm repair. EXAM: PORTABLE CHEST 1 VIEW COMPARISON:  August 01, 2015. FINDINGS: Endotracheal tube is in grossly good position. Distal tip of nasogastric tube is seen in proximal stomach. Right internal jugular Swan-Ganz catheter is noted with distal tip in expected position of main pulmonary artery. Mediastinal drain is noted. No pneumothorax or significant pleural effusion is noted. IMPRESSION: Support apparatus in good position. No pneumothorax or significant pleural effusion is noted. Electronically Signed   By: Marijo Conception, M.D.   On: 08/05/2015 18:41     Assessment/Plan: S/P Procedure(s) (LRB): REDO STERNOTOMY (N/A) REDO BENTALL PROCEDURE with circ arrest using a 32 Hemashield platinum graft.  (N/A) TRANSESOPHAGEAL ECHOCARDIOGRAM (TEE) (N/A) Mobilize Diuresis d/c tubes/lines Continue foley due to strict I&O and urinary output monitoring See progression orders     [name] [name] 08/06/2015 7:34 AM

## 2015-08-06 NOTE — Progress Notes (Signed)
Changed settings per Rapid Wean Protocol 

## 2015-08-06 NOTE — Progress Notes (Signed)
Spoke with Dr. Cyndia Bent about pt's UOP 20-25 since this afternoon.  No new orders.  Vista Lawman, RN

## 2015-08-06 NOTE — Progress Notes (Signed)
Patient ID: [name] [name], male   DOB: 1971-02-04, 45 y.o.   MRN: [id number]  SICU Evening Rounds:  Hemodynamically stable on neo 15 mcg  Sinus rhythm  Urine output ok  Up in chair.  Continue present course

## 2015-08-06 NOTE — Progress Notes (Signed)
Changed settings per Rapid Wean protocol

## 2015-08-06 NOTE — Op Note (Signed)
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  POSTOPERATIVE DIAGNOSIS:  same  PROCEDURE:  Redo sternotomy with supra coronary replacement of ascending aorta with 32-mm Hemashield graft with hypothermic circulatory arrest, and right axillary artery cannulation.  SURGEON:  Lanelle Bal, M.D.  FIRST ASSISTANT:  John Giovanni, P.A.-C.  BRIEF HISTORY:  The patient is a 45 year old male who at age 45 had aortic valve commissurotomy.  Fourteen years ago, he then underwent replacement of his aortic valve by Dr. Berline Lopes in [facility] for aortic stenosis and aortic insufficiency.  At that time, his valve was described as a bicuspid aortic valve.  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  The patient then came to my attention when he was being evaluated for hip replacement with abnormal chest x-ray leading to further evaluation with a CT scan, and he was found to have a significantly dilated ascending aorta with the midportion of the ascending aorta at 5.5 cm.  Cardiac catheterization was performed by Dr. Elonda Husky in [facility].  The patient proceeded with a hip replacement which he tolerated well, and because of his age and significant dilatation of the ascending aorta in the setting of a bicuspid aortic valve previously replaced, elective replacement of the ascending aorta and root was recommended to the patient.  Because of the increasing risk of acute aortic dissection, risks and options  were discussed with the patient and his wife in great detail.  He was willing to proceed and signed informed consent.  His Coumadin has been transitioned appropriately to Lovenox preoperatively.  DESCRIPTION OF PROCEDURE:  With Swan-Ganz and arterial line monitors in place with A line in the left wrist, the patient underwent general endotracheal anesthesia without incident.  A TEE probe confirmed the previous echo and CT findings.  The patient's currently placed St. Jude mechanical aortic valve was functioning well.  He had no mitral regurgitation.  The skin of the chest and legs was prepped with Betadine and draped in usual sterile manner.  Appropriate time-out was performed and then we proceeded initially making incision in the right infraclavicular area separating the pectoralis muscle fibers and dividing the pectoralis minor.  This gave good exposure to the axillary artery.  Vessel loops were placed around the artery.  We then proceeded with median sternotomy through the previous incision with a sagittal saw carefully dissecting the right ventricle off the back of the sternum. The chest was successfully opened with assistance of a Rultract both on the right and the left side.  Further dissection was carried out.  Care was taken not to injure the innominate vein with significant scarring around it.  After a very tedious dissection, we then exposed the  ascending aorta which as CT scan indicated was significantly dilated but at the distal end tapered to approximately 30 mm in size.  The area patched with the pericardium was identified and partly attributed to the dilatation of the root, but the remainder of the aorta was independently dilated.  While dissecting around the right side of the heart, the patient fibrillated spontaneously and was quickly defibrillated without any hemodynamic consequence.  Even after this, the patient did not have any acute ischemic EKG changes and the TEE  showed good functioning of the right and left ventricle.  We then heparinized the patient, and an 8- mm aortic graft of Gore-Tex was sewed after clamping the axillary artery and was sewn to the axillary artery with a running 5-0 Prolene.  This was de-aired and connected to the pump.  We then placed a dual stage venous cannula percutaneously through the right femoral vein.  A SonoSite was used to identify the vein.  A guidewire was placed into the vein and confirmed its position in the right atrium with TEE.  Serial dilators were placed over the wire and a dual-stage venous cannula was positioned into the cava and with the tip in the superior vena cava. This was attached to the pump.  We then placed a pledgeted stitch suture in the right atrium for retrograde coronary sinus perfusion.  The patient was then placed on cardiopulmonary bypass 2.4 L/min/m2 and cooling was started ultimately to 18 degrees.  We further dissected the ascending aorta while cooling.  A right superior pulmonary vein vent was placed.  With the patient at 18 and 19 degrees, we commenced hypothermic circulatory arrest.  After arrest, 500 mL of cold blood potassium cardioplegia was administered retrograde.  A clamp was placed on the proximal innominate artery and antegrade cerebral perfusion was begun with good return of flow in the aortic root.  We then proceeded with resection of the ascending aorta, and just proximal to the takeoff of the innominate artery, the ascending aorta was divided.  A 32-mm Hemashield graft was then anastomosed with a running 3-0 Prolene to the ascending aorta with felt strips. With the completion of this, the graft and aorta were de-aired.  A crossclamp was placed on the ascending aorta.  Aortic graft clamp had been removed from the innominate artery and full cardiopulmonary bypass was resumed with a total antegrade cerebral perfusion time of 24 minutes and a circulatory arrest time of 29  minutes.  We then turned our attention to the proximal aorta.  The right and left coronary ostium were free of disease.  The previously placed aortic valve was well seated without any perivalvular leaks. Along the what would have been the noncoronary cusp was primarily where the pericardial patch had been which had dilated out.  We decided to place the graft supra-coronary, trimming the aorta appropriately, and tailoring the graft to completely eliminate the area that had been repaired with pericardial patch along the inferior right edge. Interrupted pledgeted 3-0 Prolene sutures were used to attach the graft to the aortic cusp.  As we came anteriorly and posteriorly,  the remainder of the aorta was approximated with a running 3-0 Prolene pledgets with felt strip taking care not to kink or distort the coronary ostium especially the right.  With the anastomosis completed, the heart was allowed to passively fill and de-air the aorta and aortic root vent cardioplegia.  A McGee needle was placed in the ascending aorta to further de-air.  The anastomosis was  completed.  Aortic cross-clamp was removed with a total cross-clamp time of 74 minutes.  Intermittently, the patient had been given retrograde cold cardioplegia before removing the clamp and during the de-airing process retrograde warm cardioplegia was administered.  The patient returned to a sinus rhythm and was atrially paced increased rate.  He had anastomoses proximal and distal free of bleeding.  We then continued to rewarm the patient at 37 degrees bladder temperature, removed the right superior pulmonary vein vent and retrograde cardioplegia catheter.  The patient was ventilated and weaned from cardiopulmonary bypass without difficulty.  A right femoral vein catheter was introduced and replaced in and then was slowly withdrawn. Pressure was held for 30 minutes.  Protamine was administered.  With the operative field hemostatic and the  patient hemodynamically stable, a vascular stapler was used to divide the aortic cannulation Gore-Tex graft leaving a short segment of Gore-Tex as patch on axillary artery.  The muscle layers were reapproximated with 2-0 Vicryl interrupted sutures, and a 3-0 subcuticular skin in skin edges.  There was really no remaining pericardium to close.  The mediastinal fat was closed over the ascending aorta.  A single Blake tube drain was left in the mediastinum.  Double- stranded stainless steel wires 7 total were placed and reapproximated the sternum.  Fascia was closed with interrupted 0 Vicryl, running 3-0 Vicryl in subcutaneous tissue, 4-0 subcuticular stitch in skin edges.  Dry dressings were applied.  The patient tolerated the procedure without obvious complication.  He did not require any blood bank blood products.  Total pump time was 202 minutes.     Lanelle Bal, MD     EG/MEDQ  D:  08/06/2015  T:  08/06/2015  Job:  [id number]

## 2015-08-06 NOTE — Procedures (Signed)
Extubation Procedure Note  Patient Details:   Name: [name] [name] DOB: 04-15-1971 MRN: [id number]   Airway Documentation:  Airway 8 mm (Active)  Secured at (cm) 23 cm 08/05/2015  8:26 PM  Measured From Lips 08/05/2015  8:26 PM  Secured Location Right 08/05/2015  8:26 PM  Secured By Pink Tape 08/05/2015  8:26 PM  Site Condition Cool;Dry 08/05/2015  8:26 PM    Evaluation  O2 sats: stable throughout Complications: No apparent complications Patient did tolerate procedure well. Bilateral Breath Sounds: Clear   Yes, pt able to cough to clear secretions and hoarsely speak name. Nif -40 and VC of 5L. Pt positive for cuff leak before extubation. Pt now on 5L nasal cannula at this time and tolerating well. RT to monitor as needed  Virgilio Frees 08/06/2015, 1:18 AM

## 2015-08-06 NOTE — Care Management Note (Signed)
Case Management Note  Patient Details  Name: [name] [name] MRN: [id number] Date of Birth: October 16, 1970  Subjective/Objective:    S/p 1 Day Post-Op Procedure(s) (LRB): REDO STERNOTOMY (N/A) REDO BENTALL PROCEDURE with circ arrest using a 32 Hemashield platinum graft. (N/A) TRANSESOPHAGEAL ECHOCARDIOGRAM (TEE) (N/A)   Action/Plan:  Pt is independent from home with wife.  Family will provide 24 hour supervision at discharge if recommended.  CM will continue to follow for discharge needs   Expected Discharge Date:                  Expected Discharge Plan:  Home/Self Care  In-House Referral:     Discharge planning Services  CM Consult  Post Acute Care Choice:    Choice offered to:     DME Arranged:    DME Agency:     HH Arranged:    HH Agency:     Status of Service:  In process, will continue to follow  Medicare Important Message Given:    Date Medicare IM Given:    Medicare IM give by:    Date Additional Medicare IM Given:    Additional Medicare Important Message give by:     If discussed at [place] of Stay Meetings, dates discussed:    Additional Comments:  Maryclare Labrador, RN 08/06/2015, 3:57 PM

## 2015-08-07 ENCOUNTER — Inpatient Hospital Stay (HOSPITAL_COMMUNITY): Payer: PRIVATE HEALTH INSURANCE

## 2015-08-07 LAB — CBC
HCT: 31.6 % — ABNORMAL LOW (ref 39.0–52.0)
Hemoglobin: 10.5 g/dL — ABNORMAL LOW (ref 13.0–17.0)
MCH: 28.9 pg (ref 26.0–34.0)
MCHC: 33.2 g/dL (ref 30.0–36.0)
MCV: 87.1 fL (ref 78.0–100.0)
Platelets: 109 10*3/uL — ABNORMAL LOW (ref 150–400)
RBC: 3.63 MIL/uL — ABNORMAL LOW (ref 4.22–5.81)
RDW: 14 % (ref 11.5–15.5)
WBC: 14.9 10*3/uL — ABNORMAL HIGH (ref 4.0–10.5)

## 2015-08-07 LAB — BASIC METABOLIC PANEL
Anion gap: 8 (ref 5–15)
BUN: 15 mg/dL (ref 6–20)
CO2: 26 mmol/L (ref 22–32)
Calcium: 6.5 mg/dL — ABNORMAL LOW (ref 8.9–10.3)
Chloride: 102 mmol/L (ref 101–111)
Creatinine, Ser: 1 mg/dL (ref 0.61–1.24)
GFR calc Af Amer: >60 mL/min (ref >60)
GFR calc non Af Amer: >60 mL/min (ref >60)
Glucose, Bld: 132 mg/dL — ABNORMAL HIGH (ref 65–99)
Potassium: 3.8 mmol/L (ref 3.5–5.1)
Sodium: 136 mmol/L (ref 135–145)

## 2015-08-07 LAB — GLUCOSE, CAPILLARY
Glucose-Capillary: 102 mg/dL — ABNORMAL HIGH (ref 65–99)
Glucose-Capillary: 107 mg/dL — ABNORMAL HIGH (ref 65–99)
Glucose-Capillary: 109 mg/dL — ABNORMAL HIGH (ref 65–99)
Glucose-Capillary: 79 mg/dL (ref 65–99)
Glucose-Capillary: 95 mg/dL (ref 65–99)
Glucose-Capillary: 96 mg/dL (ref 65–99)

## 2015-08-07 LAB — POCT I-STAT 7, (LYTES, BLD GAS, ICA,H+H)
Acid-Base Excess: 2 mmol/L (ref 0.0–2.0)
Bicarbonate: 26.8 mEq/L — ABNORMAL HIGH (ref 20.0–24.0)
Calcium, Ion: 0.93 mmol/L — ABNORMAL LOW (ref 1.12–1.23)
HCT: 37 % — ABNORMAL LOW (ref 39.0–52.0)
Hemoglobin: 12.6 g/dL — ABNORMAL LOW (ref 13.0–17.0)
O2 Saturation: 100 %
Patient temperature: 35
Potassium: 3.4 mmol/L — ABNORMAL LOW (ref 3.5–5.1)
Sodium: 143 mmol/L (ref 135–145)
TCO2: 28 mmol/L (ref 0–100)
pCO2 arterial: 39.3 mmHg (ref 35.0–45.0)
pH, Arterial: 7.432 (ref 7.350–7.450)
pO2, Arterial: 503 mmHg — ABNORMAL HIGH (ref 80.0–100.0)

## 2015-08-07 LAB — PROTIME-INR
INR: 1.29 (ref 0.00–1.49)
Prothrombin Time: 16.2 seconds — ABNORMAL HIGH (ref 11.6–15.2)

## 2015-08-07 MED ORDER — ASPIRIN EC 81 MG PO TBEC
81.0000 mg | DELAYED_RELEASE_TABLET | Freq: Every day | ORAL | Status: DC
  Filled 2015-08-07: qty 1

## 2015-08-07 MED ORDER — FUROSEMIDE 10 MG/ML IJ SOLN
20.0000 mg | Freq: Once | INTRAMUSCULAR | Status: AC
  Administered 2015-08-07: 20 mg via INTRAVENOUS
  Filled 2015-08-07: qty 2

## 2015-08-07 MED ORDER — ASPIRIN 81 MG PO CHEW: 81.0000 mg | CHEWABLE_TABLET | Freq: Every day | ORAL | Status: DC

## 2015-08-07 MED ORDER — WARFARIN SODIUM 5 MG PO TABS
5.0000 mg | ORAL_TABLET | Freq: Once | ORAL | Status: AC
  Administered 2015-08-07: 5 mg via ORAL
  Filled 2015-08-07: qty 1

## 2015-08-07 MED FILL — Calcium Chloride Inj 10%: INTRAVENOUS | Qty: 10 | Status: AC

## 2015-08-07 MED FILL — Electrolyte-R (PH 7.4) Solution: INTRAVENOUS | Qty: 6000 | Status: AC

## 2015-08-07 MED FILL — Lidocaine HCl IV Inj 20 MG/ML: INTRAVENOUS | Qty: 5 | Status: AC

## 2015-08-07 MED FILL — Mannitol IV Soln 20%: INTRAVENOUS | Qty: 500 | Status: AC

## 2015-08-07 MED FILL — Heparin Sodium (Porcine) Inj 1000 Unit/ML: INTRAMUSCULAR | Qty: 10 | Status: AC

## 2015-08-07 MED FILL — Sodium Chloride IV Soln 0.9%: INTRAVENOUS | Qty: 3000 | Status: AC

## 2015-08-07 MED FILL — Sodium Bicarbonate IV Soln 8.4%: INTRAVENOUS | Qty: 50 | Status: AC

## 2015-08-07 NOTE — Progress Notes (Signed)
Patient ID: [name] [name], male   DOB: 10/04/1970, 45 y.o.   MRN: [id number] TCTS DAILY ICU PROGRESS NOTE                   [address]            [place] [address]          [phone]   2 Days Post-Op Procedure(s) (LRB): REDO STERNOTOMY (N/A) REDO BENTALL PROCEDURE with circ arrest using a 32 Hemashield platinum graft.  (N/A) TRANSESOPHAGEAL ECHOCARDIOGRAM (TEE) (N/A)  Total Length of Stay:  LOS: 2 days   Subjective: Walked this am  Objective: Vital signs in last 24 hours: Temp:  [98 F (36.7 C)-98.8 F (37.1 C)] 98.8 F (37.1 C) (03/29 0800) Pulse Rate:  [70-92] 85 (03/29 1000) Cardiac Rhythm:  [-] Normal sinus rhythm (03/29 1000) Resp:  [0-32] 20 (03/29 1000) BP: (96-120)/(55-74) 102/64 mmHg (03/29 1000) SpO2:  [90 %-100 %] 94 % (03/29 1000) Arterial Line BP: (106-160)/(38-84) 118/45 mmHg (03/29 0400) Weight:  [208 lb 5.4 oz (94.5 kg)] 208 lb 5.4 oz (94.5 kg) (03/29 0600)  Filed Weights   08/05/15 0555 08/06/15 0500 08/07/15 0600  Weight: 209 lb (94.802 kg) 207 lb 6 oz (94.065 kg) 208 lb 5.4 oz (94.5 kg)    Weight change: 15.4 oz (0.435 kg)   Hemodynamic parameters for last 24 hours:    Intake/Output from previous day: 03/28 0701 - 03/29 0700 In: 2014.7 [P.O.:720; I.V.:944.7; IV Piggyback:350] Out: 1300 [Urine:1280; Chest Tube:20]  Intake/Output this shift: Total I/O In: 210 [P.O.:120; I.V.:40; IV Piggyback:50] Out: -   Current Meds: Scheduled Meds: . acetaminophen  1,000 mg Oral 4 times per day   Or  . acetaminophen (TYLENOL) oral liquid 160 mg/5 mL  1,000 mg Per Tube 4 times per day  . aspirin EC  325 mg Oral Daily   Or  . aspirin  324 mg Per Tube Daily  . bisacodyl  10 mg Oral Daily   Or  . bisacodyl  10 mg Rectal Daily  . docusate sodium  200 mg Oral Daily  . enoxaparin (LOVENOX) injection  40 mg Subcutaneous QHS  . insulin aspart  0-24 Units Subcutaneous 6 times per day  . levothyroxine  175 mcg Oral QAC breakfast  .  metoprolol tartrate  12.5 mg Oral BID   Or  . metoprolol tartrate  12.5 mg Per Tube BID  . pantoprazole  40 mg Oral Daily  . sodium chloride flush  3 mL Intravenous Q12H  . Warfarin - Physician Dosing Inpatient   Does not apply q1800   Continuous Infusions: . sodium chloride    . lactated ringers Stopped (08/07/15 1000)  . lactated ringers Stopped (08/06/15 0830)  . nitroGLYCERIN Stopped (08/05/15 1838)  . phenylephrine (NEO-SYNEPHRINE) Adult infusion Stopped (08/07/15 0020)   PRN Meds:.lactated ringers, metoprolol, morphine injection, ondansetron (ZOFRAN) IV, oxyCODONE, sodium chloride flush, traMADol  General appearance: alert, cooperative and no distress Neurologic: intact Heart: regular rate and rhythm, S1, S2 normal, no murmur, click, rub or gallop Lungs: diminished breath sounds bibasilar Abdomen: soft, non-tender; bowel sounds normal; no masses,  no organomegaly Extremities: extremities normal, atraumatic, no cyanosis or edema and Homans sign is negative, no sign of DVT Wound: sternum stable  Lab Results: CBC: Recent Labs  08/06/15 2020 08/07/15 0440  WBC 16.9* 14.9*  HGB 11.5* 10.5*  HCT 34.1* 31.6*  PLT 123* 109*   BMET:  Recent Labs  08/06/15 0411 08/06/15 1814 08/06/15  2020 08/07/15 0440  NA 141 138  --  136  K 4.1 4.0  --  3.8  CL 110 100*  --  102  CO2 22  --   --  26  GLUCOSE 141* 141*  --  132*  BUN 9 15  --  15  CREATININE 0.90 0.90 1.02 1.00  CALCIUM 7.0*  --   --  6.5*    PT/INR:  Recent Labs  08/07/15 0440  LABPROT 16.2*  INR 1.29   Radiology: Dg Chest Port 1 View  08/07/2015  CLINICAL DATA:  Prior cardiac valve surgery. EXAM: PORTABLE CHEST 1 VIEW COMPARISON:  08/06/2015. FINDINGS: Interim removal of Swan-Ganz catheter and mediastinal drainage catheter. Right IJ sheath in stable position. Prior median sternotomy and cardiac valve replacement. Low lung volumes with persistent mild bibasilar atelectasis and/or infiltrates. No pleural  effusion or pneumothorax. IMPRESSION: 1. Interim removal Swan-Ganz catheter mediastinal drainage catheter. Right IJ sheath in stable position. 2. Prior median sternotomy and cardiac valve replacement. Stable cardiomegaly. No evidence of overt congestive heart failure. 3. Persistent low lung volumes with mild bibasilar atelectasis and/or infiltrates. Electronically Signed   By: Marcello Moores  Register   On: 08/07/2015 07:23     Assessment/Plan: S/P Procedure(s) (LRB): REDO STERNOTOMY (N/A) REDO BENTALL PROCEDURE with circ arrest using a 32 Hemashield platinum graft.  (N/A) TRANSESOPHAGEAL ECHOCARDIOGRAM (TEE) (N/A) Mobilize Diuresis started back on coumadin last night- continue coumadin Holding sinus    [name] 08/07/2015 10:26 AM

## 2015-08-07 NOTE — Progress Notes (Signed)
CT surgery p.m. Rounds  Patient examined and record reviewed.Hemodynamics stable,labs satisfactory.Patient had stable day.Continue current care. Tharon Aquas Trigt III 08/07/2015

## 2015-08-08 ENCOUNTER — Inpatient Hospital Stay (HOSPITAL_COMMUNITY): Payer: PRIVATE HEALTH INSURANCE

## 2015-08-08 LAB — BASIC METABOLIC PANEL
Anion gap: 9 (ref 5–15)
BUN: 19 mg/dL (ref 6–20)
CO2: 29 mmol/L (ref 22–32)
Calcium: 6.1 mg/dL — CL (ref 8.9–10.3)
Chloride: 96 mmol/L — ABNORMAL LOW (ref 101–111)
Creatinine, Ser: 1.04 mg/dL (ref 0.61–1.24)
GFR calc Af Amer: >60 mL/min (ref >60)
GFR calc non Af Amer: >60 mL/min (ref >60)
Glucose, Bld: 111 mg/dL — ABNORMAL HIGH (ref 65–99)
Potassium: 3.8 mmol/L (ref 3.5–5.1)
Sodium: 134 mmol/L — ABNORMAL LOW (ref 135–145)

## 2015-08-08 LAB — CBC
HCT: 29.3 % — ABNORMAL LOW (ref 39.0–52.0)
Hemoglobin: 9.4 g/dL — ABNORMAL LOW (ref 13.0–17.0)
MCH: 28.2 pg (ref 26.0–34.0)
MCHC: 32.1 g/dL (ref 30.0–36.0)
MCV: 88 fL (ref 78.0–100.0)
Platelets: 121 10*3/uL — ABNORMAL LOW (ref 150–400)
RBC: 3.33 MIL/uL — ABNORMAL LOW (ref 4.22–5.81)
RDW: 14 % (ref 11.5–15.5)
WBC: 10.9 10*3/uL — ABNORMAL HIGH (ref 4.0–10.5)

## 2015-08-08 LAB — GLUCOSE, CAPILLARY
Glucose-Capillary: 114 mg/dL — ABNORMAL HIGH (ref 65–99)
Glucose-Capillary: 99 mg/dL (ref 65–99)
Glucose-Capillary: 95 mg/dL (ref 65–99)
Glucose-Capillary: 103 mg/dL — ABNORMAL HIGH (ref 65–99)
Glucose-Capillary: 104 mg/dL — ABNORMAL HIGH (ref 65–99)

## 2015-08-08 LAB — PROTIME-INR
INR: 1.31 (ref 0.00–1.49)
Prothrombin Time: 16.4 seconds — ABNORMAL HIGH (ref 11.6–15.2)

## 2015-08-08 MED ORDER — FUROSEMIDE 40 MG PO TABS
40.0000 mg | ORAL_TABLET | Freq: Once | ORAL | Status: AC
Start: 2015-08-08 — End: 2015-08-08
  Administered 2015-08-08: 40 mg via ORAL
  Filled 2015-08-08: qty 1

## 2015-08-08 MED ORDER — MOVING RIGHT ALONG BOOK
Freq: Once | Status: DC
  Filled 2015-08-08: qty 1

## 2015-08-08 MED ORDER — ONDANSETRON HCL 4 MG PO TABS: 4.0000 mg | ORAL_TABLET | Freq: Four times a day (QID) | ORAL | Status: DC | PRN

## 2015-08-08 MED ORDER — SODIUM CHLORIDE 0.9% FLUSH
3.0000 mL | Freq: Two times a day (BID) | INTRAVENOUS | Status: DC
  Administered 2015-08-08 – 2015-08-10 (×4): 3 mL via INTRAVENOUS

## 2015-08-08 MED ORDER — SODIUM CHLORIDE 0.9 % IV SOLN: 250.0000 mL | INTRAVENOUS | Status: DC | PRN

## 2015-08-08 MED ORDER — DOCUSATE SODIUM 100 MG PO CAPS
200.0000 mg | ORAL_CAPSULE | Freq: Every day | ORAL | Status: DC
  Administered 2015-08-08 – 2015-08-11 (×4): 200 mg via ORAL
  Filled 2015-08-08 (×3): qty 2

## 2015-08-08 MED ORDER — ASPIRIN EC 81 MG PO TBEC
81.0000 mg | DELAYED_RELEASE_TABLET | Freq: Every day | ORAL | Status: DC
  Administered 2015-08-08 – 2015-08-11 (×4): 81 mg via ORAL
  Filled 2015-08-08 (×3): qty 1

## 2015-08-08 MED ORDER — ONDANSETRON HCL 4 MG/2ML IJ SOLN
4.0000 mg | Freq: Four times a day (QID) | INTRAMUSCULAR | Status: DC | PRN
Start: 2015-08-08 — End: 2015-08-12

## 2015-08-08 MED ORDER — ONDANSETRON HCL 4 MG/2ML IJ SOLN: 4.0000 mg | Freq: Four times a day (QID) | INTRAMUSCULAR | Status: DC | PRN

## 2015-08-08 MED ORDER — SODIUM CHLORIDE 0.9% FLUSH: 3.0000 mL | INTRAVENOUS | Status: DC | PRN

## 2015-08-08 MED ORDER — SODIUM CHLORIDE 0.9% FLUSH
3.0000 mL | Freq: Two times a day (BID) | INTRAVENOUS | Status: DC
  Administered 2015-08-08 – 2015-08-11 (×5): 3 mL via INTRAVENOUS

## 2015-08-08 MED ORDER — WARFARIN SODIUM 7.5 MG PO TABS
7.5000 mg | ORAL_TABLET | Freq: Once | ORAL | Status: AC
  Administered 2015-08-08: 7.5 mg via ORAL
  Filled 2015-08-08: qty 1

## 2015-08-08 MED ORDER — TRAMADOL HCL 50 MG PO TABS
50.0000 mg | ORAL_TABLET | ORAL | Status: DC | PRN
  Filled 2015-08-08: qty 2

## 2015-08-08 MED ORDER — INSULIN ASPART 100 UNIT/ML ~~LOC~~ SOLN: 0.0000 [IU] | Freq: Three times a day (TID) | SUBCUTANEOUS | Status: DC

## 2015-08-08 MED ORDER — OXYCODONE HCL 5 MG PO TABS
5.0000 mg | ORAL_TABLET | ORAL | Status: DC | PRN
  Administered 2015-08-08 – 2015-08-11 (×12): 10 mg via ORAL
  Administered 2015-08-11: 5 mg via ORAL
  Administered 2015-08-11 (×2): 10 mg via ORAL
  Administered 2015-08-11 – 2015-08-12 (×3): 5 mg via ORAL
  Administered 2015-08-12 (×2): 10 mg via ORAL
  Filled 2015-08-08 (×4): qty 2
  Filled 2015-08-08: qty 1
  Filled 2015-08-08 (×9): qty 2
  Filled 2015-08-08: qty 1
  Filled 2015-08-08 (×3): qty 2

## 2015-08-08 MED ORDER — PANTOPRAZOLE SODIUM 40 MG PO TBEC
40.0000 mg | DELAYED_RELEASE_TABLET | Freq: Every day | ORAL | Status: DC
  Administered 2015-08-09 – 2015-08-12 (×4): 40 mg via ORAL
  Filled 2015-08-08 (×4): qty 1

## 2015-08-08 MED ORDER — ONDANSETRON HCL 4 MG PO TABS
4.0000 mg | ORAL_TABLET | Freq: Four times a day (QID) | ORAL | Status: DC | PRN
  Administered 2015-08-08 – 2015-08-12 (×2): 4 mg via ORAL
  Filled 2015-08-08 (×2): qty 1

## 2015-08-08 MED ORDER — BISACODYL 5 MG PO TBEC: 10.0000 mg | DELAYED_RELEASE_TABLET | Freq: Every day | ORAL | Status: DC | PRN

## 2015-08-08 MED ORDER — WARFARIN SODIUM 7.5 MG PO TABS: 7.5000 mg | ORAL_TABLET | Freq: Once | ORAL | Status: DC

## 2015-08-08 MED ORDER — BISACODYL 5 MG PO TBEC
10.0000 mg | DELAYED_RELEASE_TABLET | Freq: Every day | ORAL | Status: DC | PRN
  Administered 2015-08-11: 10 mg via ORAL
  Filled 2015-08-08: qty 2

## 2015-08-08 MED ORDER — BISACODYL 10 MG RE SUPP: 10.0000 mg | Freq: Every day | RECTAL | Status: DC | PRN

## 2015-08-08 MED ORDER — MOVING RIGHT ALONG BOOK
Freq: Once | Status: AC
  Administered 2015-08-08: 10:00:00
  Filled 2015-08-08: qty 1

## 2015-08-08 NOTE — Progress Notes (Signed)
Utilization review completed.  

## 2015-08-08 NOTE — Progress Notes (Signed)
CRITICAL VALUE ALERT  Critical value received:  Ca 6.1  Date of notification:  08/08/15  Time of notification:  0551  Critical value read back:Yes.    Nurse who received alert:  Vista Lawman  MD notified (1st page):  Dr. Prescott Gum  Time of first page:  [phone]  Responding MD:  Dr. Prescott Gum  Time MD responded:  [phone]

## 2015-08-08 NOTE — Progress Notes (Signed)
Patient ID: [name] [name], male   DOB: 10-15-1970, 45 y.o.   MRN: [id number] [facility] DAILY ICU PROGRESS NOTE                   [place]            [place] [address]          [phone]   3 Days Post-Op Procedure(s) (LRB): REDO STERNOTOMY (N/A) REDO BENTALL PROCEDURE with circ arrest using a 32 Hemashield platinum graft.  (N/A) TRANSESOPHAGEAL ECHOCARDIOGRAM (TEE) (N/A)  Total Length of Stay:  LOS: 3 days   Subjective: Feels better this am , walked around the unit  Objective: Vital signs in last 24 hours: Temp:  [98.4 F (36.9 C)-99.1 F (37.3 C)] 98.4 F (36.9 C) (03/30 0700) Pulse Rate:  [74-91] 80 (03/30 0700) Cardiac Rhythm:  [-] Normal sinus rhythm (03/30 0400) Resp:  [16-29] 22 (03/30 0700) BP: (97-127)/(57-78) 97/60 mmHg (03/30 0700) SpO2:  [90 %-100 %] 90 % (03/30 0700) Weight:  [209 lb 3.5 oz (94.9 kg)] 209 lb 3.5 oz (94.9 kg) (03/30 0600)  Filed Weights   08/06/15 0500 08/07/15 0600 08/08/15 0600  Weight: 207 lb 6 oz (94.065 kg) 208 lb 5.4 oz (94.5 kg) 209 lb 3.5 oz (94.9 kg)    Weight change: 14.1 oz (0.4 kg)   Hemodynamic parameters for last 24 hours:    Intake/Output from previous day: 03/29 0701 - 03/30 0700 In: 1110 [P.O.:1020; I.V.:40; IV Piggyback:50] Out: 870 [Urine:870]  Intake/Output this shift:    Current Meds: Scheduled Meds: . acetaminophen  1,000 mg Oral 4 times per day   Or  . acetaminophen (TYLENOL) oral liquid 160 mg/5 mL  1,000 mg Per Tube 4 times per day  . aspirin EC  81 mg Oral Daily   Or  . aspirin  81 mg Per Tube Daily  . bisacodyl  10 mg Oral Daily   Or  . bisacodyl  10 mg Rectal Daily  . docusate sodium  200 mg Oral Daily  . enoxaparin (LOVENOX) injection  40 mg Subcutaneous QHS  . insulin aspart  0-24 Units Subcutaneous 6 times per day  . levothyroxine  175 mcg Oral QAC breakfast  . metoprolol tartrate  12.5 mg Oral BID   Or  . metoprolol tartrate  12.5 mg Per Tube BID  . pantoprazole  40 mg Oral  Daily  . sodium chloride flush  3 mL Intravenous Q12H  . Warfarin - Physician Dosing Inpatient   Does not apply q1800   Continuous Infusions: . sodium chloride    . lactated ringers Stopped (08/07/15 1000)  . lactated ringers Stopped (08/06/15 0830)  . nitroGLYCERIN Stopped (08/05/15 1838)  . phenylephrine (NEO-SYNEPHRINE) Adult infusion Stopped (08/07/15 0020)   PRN Meds:.lactated ringers, metoprolol, morphine injection, ondansetron (ZOFRAN) IV, oxyCODONE, sodium chloride flush, traMADol  General appearance: alert, cooperative and no distress Neurologic: intact Heart: regular rate and rhythm, S1, S2 normal, no murmur, click, rub or gallop and valve sounds crisp Lungs: clear to auscultation bilaterally Abdomen: soft, non-tender; bowel sounds normal; no masses,  no organomegaly Extremities: extremities normal, atraumatic, no cyanosis or edema and Homans sign is negative, no sign of DVT Wound: sternum intact  Lab Results: CBC: Recent Labs  08/07/15 0440 08/08/15 0450  WBC 14.9* 10.9*  HGB 10.5* 9.4*  HCT 31.6* 29.3*  PLT 109* 121*   BMET:  Recent Labs  08/07/15 0440 08/08/15 0450  NA 136 134*  K 3.8 3.8  CL 102 96*  CO2 26 29  GLUCOSE 132* 111*  BUN 15 19  CREATININE 1.00 1.04  CALCIUM 6.5* 6.1*    PT/INR:  Recent Labs  08/08/15 0450  LABPROT 16.4*  INR 1.31   Radiology: Dg Chest Port 1 View  08/08/2015  CLINICAL DATA:  Chest tubes. EXAM: PORTABLE CHEST 1 VIEW COMPARISON:  August 07, 2015. FINDINGS: Stable cardiomegaly. Sternotomy wires are noted. No chest tubes are noted currently. No pneumothorax is noted. Right internal jugular venous sheath is unchanged. Stable mild left basilar subsegmental atelectasis is noted. Stable moderate right basilar subsegmental atelectasis or infiltrate is noted. Possible minimal right pleural effusion. Bony thorax is unremarkable. IMPRESSION: Stable bibasilar opacities as described above. Electronically Signed   By: Marijo Conception,  M.D.   On: 08/08/2015 07:28     Assessment/Plan: S/P Procedure(s) (LRB): REDO STERNOTOMY (N/A) REDO BENTALL PROCEDURE with circ arrest using a 32 Hemashield platinum graft.  (N/A) TRANSESOPHAGEAL ECHOCARDIOGRAM (TEE) (N/A) Mobilize Diuresis Plan for transfer to step-down: see transfer orders INR 1.3, back on coumadin 7.5 mg today     [name] 08/08/2015 8:40 AM

## 2015-08-08 NOTE — Progress Notes (Signed)
1335 Came to see pt and offer to walk. Pt has walked three times today and walked over recently. Nauseated now. We will follow up tomorrow. Graylon Good RN BSN 08/08/2015 1:35 PM

## 2015-08-09 ENCOUNTER — Inpatient Hospital Stay (HOSPITAL_COMMUNITY): Payer: PRIVATE HEALTH INSURANCE

## 2015-08-09 LAB — GLUCOSE, CAPILLARY
Glucose-Capillary: 86 mg/dL (ref 65–99)
Glucose-Capillary: 100 mg/dL — ABNORMAL HIGH (ref 65–99)
Glucose-Capillary: 97 mg/dL (ref 65–99)

## 2015-08-09 LAB — BASIC METABOLIC PANEL
Anion gap: 11 (ref 5–15)
BUN: 14 mg/dL (ref 6–20)
CO2: 31 mmol/L (ref 22–32)
Calcium: 6.4 mg/dL — CL (ref 8.9–10.3)
Chloride: 94 mmol/L — ABNORMAL LOW (ref 101–111)
Creatinine, Ser: 0.91 mg/dL (ref 0.61–1.24)
GFR calc Af Amer: >60 mL/min (ref >60)
GFR calc non Af Amer: >60 mL/min (ref >60)
Glucose, Bld: 106 mg/dL — ABNORMAL HIGH (ref 65–99)
Potassium: 3.6 mmol/L (ref 3.5–5.1)
Sodium: 136 mmol/L (ref 135–145)

## 2015-08-09 LAB — CBC
HCT: 31.9 % — ABNORMAL LOW (ref 39.0–52.0)
Hemoglobin: 10 g/dL — ABNORMAL LOW (ref 13.0–17.0)
MCH: 27.6 pg (ref 26.0–34.0)
MCHC: 31.3 g/dL (ref 30.0–36.0)
MCV: 88.1 fL (ref 78.0–100.0)
Platelets: 181 10*3/uL (ref 150–400)
RBC: 3.62 MIL/uL — ABNORMAL LOW (ref 4.22–5.81)
RDW: 13.7 % (ref 11.5–15.5)
WBC: 7.1 10*3/uL (ref 4.0–10.5)

## 2015-08-09 LAB — PROTIME-INR
INR: 1.25 (ref 0.00–1.49)
Prothrombin Time: 15.9 seconds — ABNORMAL HIGH (ref 11.6–15.2)

## 2015-08-09 MED ORDER — METOPROLOL TARTRATE 12.5 MG HALF TABLET
12.5000 mg | ORAL_TABLET | Freq: Two times a day (BID) | ORAL | Status: DC
  Administered 2015-08-09 – 2015-08-11 (×6): 12.5 mg via ORAL
  Filled 2015-08-09 (×6): qty 1

## 2015-08-09 MED ORDER — FUROSEMIDE 40 MG PO TABS
40.0000 mg | ORAL_TABLET | Freq: Every day | ORAL | Status: DC
  Administered 2015-08-09 – 2015-08-11 (×3): 40 mg via ORAL
  Filled 2015-08-09 (×3): qty 1

## 2015-08-09 MED ORDER — WARFARIN SODIUM 10 MG PO TABS
10.0000 mg | ORAL_TABLET | Freq: Every day | ORAL | Status: DC
  Administered 2015-08-09 – 2015-08-11 (×4): 10 mg via ORAL
  Filled 2015-08-09 (×3): qty 1

## 2015-08-09 MED ORDER — WARFARIN SODIUM 10 MG PO TABS
10.0000 mg | ORAL_TABLET | Freq: Every day | ORAL | Status: DC
  Filled 2015-08-09: qty 1

## 2015-08-09 MED ORDER — SODIUM CHLORIDE 0.9 % IV SOLN
1.0000 g | Freq: Once | INTRAVENOUS | Status: AC
  Administered 2015-08-09: 1 g via INTRAVENOUS
  Filled 2015-08-09: qty 10

## 2015-08-09 MED ORDER — POTASSIUM CHLORIDE ER 10 MEQ PO TBCR
20.0000 meq | EXTENDED_RELEASE_TABLET | Freq: Every day | ORAL | Status: DC
  Administered 2015-08-09 – 2015-08-11 (×3): 20 meq via ORAL
  Filled 2015-08-09 (×10): qty 2

## 2015-08-09 MED ORDER — LACTULOSE 10 GM/15ML PO SOLN: 20.0000 g | Freq: Every day | ORAL | Status: DC | PRN

## 2015-08-09 NOTE — Progress Notes (Signed)
CARDIAC REHAB PHASE I   up in hall with RN  MODE:  Ambulation: 350 ft   POST:  Rate/Rhythm: 84 SR  BP:  Supine:   Sitting: 102/68  Standing:    SaO2: 90-91%RA 1330-1345 Pt up in hall with RN. I offered to take over to encourage pt to walk farther as he walked 150 ft earlier. Pt able to walk 350 ft on RA with rolling walker independently and tolerated well. Discussed with pt increasing distance each day to increase endurance. To recliner after walk.    Graylon Good, RN BSN  08/09/2015 1:42 PM

## 2015-08-09 NOTE — Discharge Summary (Signed)
Physician Discharge Summary  Patient ID: [name] [name] MRN: [id number] DOB/AGE: 45-Jan-1972 45 y.o.  Admit date: 08/05/2015 Discharge date: 08/12/2015  Admission Diagnoses:  Patient Active Problem List   Diagnosis Date Noted  . Hypocalcemia 08/12/2015  . Thoracic aortic aneurysm ([place]) 08/05/2015  . Osteoarthritis of left hip 05/10/2015  . Status post total replacement of left hip 05/10/2015  . Thoracic aortic aneurysm, without rupture ([place]) 05/01/2015  . Incidental pulmonary nodule, > 53mm and < 66mm left lower lobe 05/01/2015  . H/O bicuspid aortic valve 05/01/2015  . HLD (hyperlipidemia) 01/03/2015  . 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 BP (high blood pressure) 12/19/2014  . Long term current use of anticoagulant 12/03/2014  . H/O aortic valve replacement 12/03/2014  . Degenerative arthritis of hip 03/17/2011   Discharge Diagnoses:   Patient Active Problem List   Diagnosis Date Noted  . Hypocalcemia 08/12/2015  . Thoracic aortic aneurysm ([place]) 08/05/2015  . Osteoarthritis of left hip 05/10/2015  . Status post total replacement of left hip 05/10/2015  . Thoracic aortic aneurysm, without rupture ([place]) 05/01/2015  . Incidental pulmonary nodule, > 44mm and < 13mm left lower lobe 05/01/2015  . H/O bicuspid aortic valve 05/01/2015  . HLD (hyperlipidemia) 01/03/2015  . 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 BP (high blood pressure) 12/19/2014  . Long term current use of anticoagulant 12/03/2014  . H/O aortic valve replacement 12/03/2014  . Degenerative arthritis of hip 03/17/2011   Discharged Condition: good  History of Present Illness:  [name] is a 45 yo white male with history of congenital critical aortic stenosis who had open valve commissurotomy performed by [facility].  He also underwent aortic valve replacement with a 23 mm [employer] Jude mechanical valve prosthesis.  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  In 2016 the  patient was diagnosed with thyroid carcinoma and a total thyroidectomy was performed.  In November 2016 the patient had a repeat CT scan which showed a 5.5 cm dilatation of the ascending aorta.  His primary cardiologist in [place] performed a cardiac catheterization which did not show coronary artery disease.  It was felt surgical intervention would be indicated and he was referred to [facility] for evaluation.  Dr. Servando Snare evaluated the patient and recommended replacement of the ascending aorta.  The patient was agreeable to proceed with mechanical aortic valve should that be needed.  The risks and benefits of the procedure were explained to the patient and he was agreeable to proceed.     Hospital Course:   [name] presented to [facility].  He was taken to the operating room and underwent Redo Sternotomy with supra coronary replacement of ascending aorta with 32 mm Hemashield graft with hypothermic circulatory arrest with right axillary cannulation.  He tolerated the procedure without difficulty and was taken to the SICU in stable condition.  During his stay in the SICU the patient was extubated.  The patient was restarted on Coumadin for his previous mechanical valve.  His chest tubes and arterial lines were removed without difficulty.  He was maintaining NSR and transferred to the step down unit on POD #3.  He continues to make progress.  He is maintaining NSR and his pacing wires have been removed without difficulty.  He is hypocalcemic and was supplemented with Calcium gluconate.  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  Vit D and PTH levels were drawn and  were found to be low at 7.0.  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  His INR is trending up and is currently at 2.21.  He is on his home regimen of 10 mg alternating with 11 mg twice a week.  He is  ambulating without difficulty.  His pain is well controlled.  He is tolerating a heart healthy diet.  He is felt medically stable for discharge home today.      Treatments: surgery:   Redo sternotomy with supra coronary replacement of ascending aorta with 32-mm Hemashield graft with hypothermic circulatory arrest, and right axillary artery cannulation.  Disposition: Home  Discharge medications:  The patient has been discharged on:   1.Beta Blocker:  Yes [ x  ]                              No   [   ]                              If No, reason:  2.Ace Inhibitor/ARB: Yes [ x  ]                                     No  [    ]                                     If No, reason:  3.Statin:   Yes [   ]                  No  [ x  ]                  If No, reason: no CAD  4.[place]:  Yes  [ x  ]                  No   [   ]                  If No, reason:        Medication List    STOP taking these medications        LOVENOX 100 MG/ML injection  Generic drug:  enoxaparin      TAKE these medications        albuterol 108 (90 Base) MCG/ACT inhaler  Commonly known as:  PROVENTIL HFA;VENTOLIN HFA  Inhale 2 puffs into the lungs every 6 (six) hours as needed for wheezing or shortness of breath.     aspirin 81 MG EC tablet  Take 1 tablet (81 mg total) by mouth daily.     calcium carbonate 1250 (500 Ca) MG tablet  Commonly known as:  OS-CAL - dosed in mg of elemental calcium  Take 1 tablet (500 mg of elemental calcium total) by mouth 2 (two) times daily with a meal.     furosemide 40 MG tablet  Commonly known as:  LASIX  Take 1 tablet (40 mg total) by mouth daily. For 7 Days     levothyroxine 175 MCG tablet  Commonly known as:  SYNTHROID, LEVOTHROID  Take 175 mcg by mouth daily  before breakfast.     lisinopril 5 MG tablet  Commonly known as:  PRINIVIL,ZESTRIL  Take 5 mg by mouth daily.     metoprolol tartrate 25 MG tablet  Commonly known as:  LOPRESSOR  Take 0.5 tablets  (12.5 mg total) by mouth 2 (two) times daily.     oxyCODONE 5 MG immediate release tablet  Commonly known as:  Oxy IR/ROXICODONE  Take 1-2 tablets (5-10 mg total) by mouth every 3 (three) hours as needed for severe pain.     Potassium Chloride ER 20 MEQ Tbcr  Take 20 mEq by mouth daily. For 7 Days     ranitidine 150 MG tablet  Commonly known as:  ZANTAC  Take 150 mg by mouth every evening.     Vitamin D3 400 units tablet  Take 1 tablet (400 Units total) by mouth daily.     warfarin 1 MG tablet  Commonly known as:  COUMADIN  Take 1 mg by mouth as directed. Reported on 08/01/2015     warfarin 10 MG tablet  Commonly known as:  COUMADIN  Take 10 mg by mouth daily. Reported on 08/01/2015       Follow-up Information    Follow up with [name], MD On 09/12/2015.   Specialty:  Cardiothoracic Surgery   Why:  Appointment is at 2:00   Contact information:   [address] [place] [address] [phone]       Follow up with [facility] On 09/12/2015.   Why:  Please get CXR at 1:30   Contact information:   [place]       Follow up with Paulita Fujita, MD.   Specialty:  Cardiology   Why:  Please contact office to set up follow up appointment at discharge   Contact information:   [place] [address] [phone]       Follow up with PT/INR.   Why:  Please have PT/INR checked 48 hours after discharge      Signed: Ellwood Handler 08/12/2015, 8:44 AM

## 2015-08-09 NOTE — Discharge Instructions (Signed)
Aortic Replacement, Care After Refer to this sheet in the next few weeks. These instructions provide you with information on caring for yourself after your procedure. Your health care provider may also give you specific instructions. Your treatment has been planned according to current medical practices, but problems sometimes occur. Call your health care provider if you have any problems or questions after your procedure. HOME CARE INSTRUCTIONS   Take medicines only as directed by your health care provider.  If your health care provider has prescribed elastic stockings, wear them as directed.  Take frequent naps or rest often throughout the day.  Avoid lifting over 10 lbs (4.5 kg) or pushing or pulling things with your arms for 6-8 weeks or as directed by your health care provider.  Avoid driving or airplane travel for 4-6 weeks after surgery or as directed by your health care provider. If you are riding in a car for an extended period, stop every 1-2 hours to stretch your legs. Keep a record of your medicines and medical history with you when traveling.  Do not drive or operate heavy machinery while taking pain medicine. (narcotics).  Do not cross your legs.  Do not use any tobacco products including cigarettes, chewing tobacco, or electronic cigarettes. If you need help quitting, ask your health care provider.  Do not take baths, swim, or use a hot tub until your health care provider approves. Take showers once your health care provider approves. Pat incisions dry. Do not rub incisions with a washcloth or towel.  Avoid climbing stairs and using the handrail to pull yourself up for the first 2-3 weeks after surgery.  Return to work as directed by your health care provider.  Drink enough fluid to keep your urine clear or pale yellow.  Do not strain to have a bowel movement. Eat high-fiber foods if you become constipated. You may also take a medicine to help you have a bowel movement  (laxative) as directed by your health care provider.  Resume sexual activity as directed by your health care provider. Men should not use medicines for erectile dysfunction until their doctor says it isokay.  If you had a certain type of heart condition in the past, you may need to take antibiotic medicine before having dental work or surgery. Let your dentist and health care providers know if you had one or more of the following:  Previous endocarditis.  An artificial (prosthetic) heart valve.  Congenital heart disease. SEEK MEDICAL CARE IF:  You develop a skin rash.   You experience sudden changes in your weight.  You have a fever. SEEK IMMEDIATE MEDICAL CARE IF:   You develop chest pain that is not coming from your incision.  You have drainage (pus), redness, swelling, or pain at your incision site.   You develop shortness of breath or have difficulty breathing.   You have increased bleeding from your incision site.   You develop light-headedness.  MAKE SURE YOU:   Understand these directions.  Will watch your condition.  Will get help right away if you are not doing well or get worse.   This information is not intended to replace advice given to you by your health care provider. Make sure you discuss any questions you have with your health care provider.   Document Released: 11/13/2004 Document Revised: 05/18/2014 Document Reviewed: 02/09/2012 Elsevier Interactive Patient Education [employer].

## 2015-08-09 NOTE — Progress Notes (Signed)
Pt ambulated with staff nurse and wife 125ft. Pt complained of general weakness and pain at incision site. Pt had no complaints of shortness of breath or chest pain. Pt taken down for xray.

## 2015-08-09 NOTE — Progress Notes (Signed)
Pacing wires removed. Pt tolerated well. Pt educated on importance of hour bedrest. Vital signs taken. Pt has no complaints of shortness of breath, dizziness, or chest pain. Will continue to monitor.

## 2015-08-09 NOTE — Progress Notes (Addendum)
      [address]       [place] [address]             [phone]      4 Days Post-Op Procedure(s) (LRB): REDO STERNOTOMY (N/A) REDO BENTALL PROCEDURE with circ arrest using a 32 Hemashield platinum graft.  (N/A) TRANSESOPHAGEAL ECHOCARDIOGRAM (TEE) (N/A)   Subjective:  [name] states he is doing okay.  He had some nausea last evening which has resolved.  + ambulation  Objective: Vital signs in last 24 hours: Temp:  [97.8 F (36.6 C)-98.6 F (37 C)] 98.6 F (37 C) (03/31 0310) Pulse Rate:  [65-93] 93 (03/31 0310) Cardiac Rhythm:  [-] Normal sinus rhythm (03/30 1949) Resp:  [16-19] 18 (03/31 0310) BP: (99-124)/(58-73) 124/72 mmHg (03/31 0310) SpO2:  [89 %-100 %] 95 % (03/31 0310) Weight:  [206 lb 6.4 oz (93.622 kg)] 206 lb 6.4 oz (93.622 kg) (03/31 0310)  Intake/Output from previous day: 03/30 0701 - 03/31 0700 In: 120 [P.O.:120] Out: 825 [Urine:825] Intake/Output this shift: Total I/O In: -  Out: 175 [Urine:175]  General appearance: alert, cooperative and no distress Heart: regular rate and rhythm Lungs: diminished breath sounds right base Abdomen: soft, non-tender; bowel sounds normal; no masses,  no organomegaly Extremities: edema trace Wound: clean and dry  Lab Results:  Recent Labs  08/08/15 0450 08/09/15 0340  WBC 10.9* 7.1  HGB 9.4* 10.0*  HCT 29.3* 31.9*  PLT 121* 181   BMET:  Recent Labs  08/08/15 0450 08/09/15 0340  NA 134* 136  K 3.8 3.6  CL 96* 94*  CO2 29 31  GLUCOSE 111* 106*  BUN 19 14  CREATININE 1.04 0.91  CALCIUM 6.1* 6.4*    PT/INR:  Recent Labs  08/09/15 0340  LABPROT 15.9*  INR 1.25   ABG    Component Value Date/Time   PHART 7.305* 08/06/2015 0214   HCO3 23.3 08/06/2015 0214   TCO2 23 08/06/2015 1814   ACIDBASEDEF 3.0* 08/06/2015 0214   O2SAT 97.0 08/06/2015 0214   CBG (last 3)   Recent Labs  08/08/15 1634 08/08/15 2039 08/09/15 0610  GLUCAP 103* 104* 86    Assessment/Plan: S/P  Procedure(s) (LRB): REDO STERNOTOMY (N/A) REDO BENTALL PROCEDURE with circ arrest using a 32 Hemashield platinum graft.  (N/A) TRANSESOPHAGEAL ECHOCARDIOGRAM (TEE) (N/A)  1. CV- NSR, HR tachy, BP slowly trending up- will start low dose Beta Blocker with parameters in place 2. Pulm- no acute issues, off oxygen, PA/LAT CXR ordered, not yet completed 3. Renal- creatinine WNL, mild hypervolemia will repeat Lasix today 4. INR 1.25, will place patient back on home regimen of 10 mg 5. Hypocalcemia- will speak with pharmacy about supplementation 6. CBgs controlled, not a diabetic will d/c SSIP 7. Dispo- patient stable, maintaining NSR will d/c EPW today,  Continue coumadin, will supplement calcium continue current care   LOS: 4 days    BARRETT, ERIN 08/09/2015    Back on coumadin 2.5, 5, 7.5 past 3 days 10 mg today usuall dose is 10 mg daily with 11 mg two days week On low dose Lovenox also 81 mg asa  I have seen and examined [name] [name] and agree with the above assessment  and plan.  [name] Isaac MD [facility] [phone] Office [phone] 08/09/2015 11:58 AM

## 2015-08-09 NOTE — Progress Notes (Signed)
RN received critical lab of Calcium 6.4. RN reviewed previous labs and noted Calcium 6.1 yesterday. RN will pass along to oncoming RN.  Arnell Sieving, RN

## 2015-08-10 LAB — BASIC METABOLIC PANEL
ANION GAP: 12 (ref 5–15)
BUN: 13 mg/dL (ref 6–20)
CO2: 30 mmol/L (ref 22–32)
Calcium: 6.7 mg/dL — ABNORMAL LOW (ref 8.9–10.3)
Chloride: 93 mmol/L — ABNORMAL LOW (ref 101–111)
Creatinine, Ser: 0.98 mg/dL (ref 0.61–1.24)
Glucose, Bld: 111 mg/dL — ABNORMAL HIGH (ref 65–99)
Potassium: 3.6 mmol/L (ref 3.5–5.1)
Sodium: 135 mmol/L (ref 135–145)

## 2015-08-10 LAB — COMPREHENSIVE METABOLIC PANEL
ALT: 18 U/L (ref 17–63)
AST: 25 U/L (ref 15–41)
Albumin: 2.5 g/dL — ABNORMAL LOW (ref 3.5–5.0)
Alkaline Phosphatase: 124 U/L (ref 38–126)
Anion gap: 8 (ref 5–15)
BUN: 13 mg/dL (ref 6–20)
CO2: 32 mmol/L (ref 22–32)
Calcium: 5.9 mg/dL — CL (ref 8.9–10.3)
Chloride: 95 mmol/L — ABNORMAL LOW (ref 101–111)
Creatinine, Ser: 0.95 mg/dL (ref 0.61–1.24)
GFR calc Af Amer: >60 mL/min (ref >60)
GFR calc non Af Amer: >60 mL/min (ref >60)
Glucose, Bld: 99 mg/dL (ref 65–99)
Potassium: 3.9 mmol/L (ref 3.5–5.1)
Sodium: 135 mmol/L (ref 135–145)
Total Bilirubin: 0.5 mg/dL (ref 0.3–1.2)
Total Protein: 5.5 g/dL — ABNORMAL LOW (ref 6.5–8.1)

## 2015-08-10 LAB — PROTIME-INR
INR: 1.57 — ABNORMAL HIGH (ref 0.00–1.49)
Prothrombin Time: 18.8 seconds — ABNORMAL HIGH (ref 11.6–15.2)

## 2015-08-10 MED ORDER — CALCIUM GLUCONATE 10 % IV SOLN
2.0000 g | Freq: Once | INTRAVENOUS | Status: AC
  Administered 2015-08-10: 2 g via INTRAVENOUS
  Filled 2015-08-10: qty 20

## 2015-08-10 NOTE — Progress Notes (Addendum)
      [address]       [place] [address]             [phone]      5 Days Post-Op Procedure(s) (LRB): REDO STERNOTOMY (N/A) REDO BENTALL PROCEDURE with circ arrest using a 32 Hemashield platinum graft.  (N/A) TRANSESOPHAGEAL ECHOCARDIOGRAM (TEE) (N/A)   Subjective:  No new complaints.  He does states he had problems with low calcium after his thyroidectomy.  + ambulation  No BM  Objective: Vital signs in last 24 hours: Temp:  [98.3 F (36.8 C)-99 F (37.2 C)] 98.6 F (37 C) (04/01 0616) Pulse Rate:  [82-88] 85 (04/01 0616) Cardiac Rhythm:  [-] Normal sinus rhythm (04/01 0730) Resp:  [18] 18 (04/01 0616) BP: (103-114)/(55-66) 103/65 mmHg (04/01 0616) SpO2:  [91 %-94 %] 91 % (04/01 0616) Weight:  [206 lb 6.4 oz (93.622 kg)] 206 lb 6.4 oz (93.622 kg) (04/01 0616)  Intake/Output from previous day: 03/31 0701 - 04/01 0700 In: 480 [P.O.:480] Out: 725 [Urine:725]  General appearance: alert, cooperative and no distress Heart: regular rate and rhythm Lungs: clear to auscultation bilaterally Abdomen: soft, non-tender; bowel sounds normal; no masses,  no organomegaly Extremities: edema trace Wound: clean and dry  Lab Results:  Recent Labs  08/08/15 0450 08/09/15 0340  WBC 10.9* 7.1  HGB 9.4* 10.0*  HCT 29.3* 31.9*  PLT 121* 181   BMET:  Recent Labs  08/09/15 0340 08/10/15 0256  NA 136 135  K 3.6 3.9  CL 94* 95*  CO2 31 32  GLUCOSE 106* 99  BUN 14 13  CREATININE 0.91 0.95  CALCIUM 6.4* 5.9*    PT/INR:  Recent Labs  08/10/15 0256  LABPROT 18.8*  INR 1.57*   ABG    Component Value Date/Time   PHART 7.305* 08/06/2015 0214   HCO3 23.3 08/06/2015 0214   TCO2 23 08/06/2015 1814   ACIDBASEDEF 3.0* 08/06/2015 0214   O2SAT 97.0 08/06/2015 0214   CBG (last 3)   Recent Labs  08/09/15 0610 08/09/15 1125 08/09/15 1708  GLUCAP 86 100* 97    Assessment/Plan: S/P Procedure(s) (LRB): REDO STERNOTOMY (N/A) REDO BENTALL PROCEDURE  with circ arrest using a 32 Hemashield platinum graft.  (N/A) TRANSESOPHAGEAL ECHOCARDIOGRAM (TEE) (N/A)  1. CV- hemodynamically stable, HR improved- continue beta blocker 2. Pulm- no acute issues, off oxygen, continue IS 3. Renal- creatinine WNL, continue Lasix for now 4. Hypocalcemia- will repeat Calcium gluconate today.... Per patient had problems with this in past, will need to follow up with Endocrinologist at discharge 5. INR 1.59, continue coumadin at 10 mg daily 6. 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 Roxan Hockey, MD [facility] [phone]

## 2015-08-10 NOTE — Progress Notes (Signed)
CARDIAC REHAB PHASE I   PRE:  Rate/Rhythm: 72 sinus  BP:  Supine: 115/69  Sitting:   Standing:    SaO2:   MODE:  Ambulation: 890 ft   POST:  Rate/Rhythem: 82 sinus  BP:  Supine:   Sitting: 117/76  Standing:    SaO2:   Pt ambulated 890 ft with minimal assistance.  Reviewed pt d/c education: sternal precautions, restrictions, ISP use, coughing/splinting, diet, exercise guidelines, and when to call 911/MD.  Pt requested to have info sent to [facility] for Cardiac Rehab Phase II.  Pt voiced understanding.  Pt returned to chair for lunch with call bell in reach and VSS.  Encouraged to continue to walk over weekend. Alberteen Sam, MA, ACSM RCEP [phone]  Clotilde Dieter

## 2015-08-10 NOTE — Progress Notes (Addendum)
CRITICAL VALUE ALERT  Critical value received:  Calcium of 5.9  Date of notification:  08/10/2015  Time of notification:  0408  Critical value read back: Yes  Nurse who received alert:  Izola Price, RN  MD notified (1st page):  Dr. Roxan Hockey  Time of first page:  [phone]  Responding MD: Dr. Roxan Hockey  Time MD responded:  [phone]   MD notified and ordered ionized calcium to be included in lab draw this morning, RN will continued to monitor.    08/10/2015 Izola Price, RN

## 2015-08-11 LAB — BASIC METABOLIC PANEL
Anion gap: 12 (ref 5–15)
BUN: 10 mg/dL (ref 6–20)
CALCIUM: 6.2 mg/dL — AB (ref 8.9–10.3)
CHLORIDE: 96 mmol/L — AB (ref 101–111)
CO2: 26 mmol/L (ref 22–32)
CREATININE: 0.87 mg/dL (ref 0.61–1.24)
GFR calc Af Amer: >60 mL/min (ref >60)
GFR calc non Af Amer: >60 mL/min (ref >60)
GLUCOSE: 115 mg/dL — AB (ref 65–99)
Potassium: 3.8 mmol/L (ref 3.5–5.1)
Sodium: 134 mmol/L — ABNORMAL LOW (ref 135–145)

## 2015-08-11 LAB — PTH, INTACT AND CALCIUM
Calcium, Total (PTH): 6.4 mg/dL — CL (ref 8.7–10.2)
PTH: 7 pg/mL — AB (ref 15–65)

## 2015-08-11 LAB — PROTIME-INR
INR: 1.75 — ABNORMAL HIGH (ref 0.00–1.49)
Prothrombin Time: 20.4 seconds — ABNORMAL HIGH (ref 11.6–15.2)

## 2015-08-11 LAB — CALCIUM, IONIZED: CALCIUM, IONIZED, SERUM: 3.3 mg/dL — AB (ref 4.5–5.6)

## 2015-08-11 MED ORDER — CHOLECALCIFEROL 10 MCG (400 UNIT) PO TABS
400.0000 [IU] | ORAL_TABLET | Freq: Every day | ORAL | Status: DC
  Administered 2015-08-11: 400 [IU] via ORAL
  Filled 2015-08-11 (×3): qty 1

## 2015-08-11 MED ORDER — CALCIUM CARBONATE 1250 (500 CA) MG PO TABS
1.0000 | ORAL_TABLET | Freq: Two times a day (BID) | ORAL | Status: DC
  Administered 2015-08-11 – 2015-08-12 (×2): 500 mg via ORAL
  Filled 2015-08-11 (×2): qty 1

## 2015-08-11 MED ORDER — LACTULOSE 10 GM/15ML PO SOLN
20.0000 g | Freq: Every day | ORAL | Status: DC | PRN
  Administered 2015-08-11: 20 g via ORAL
  Filled 2015-08-11: qty 30

## 2015-08-11 MED ORDER — POLYETHYLENE GLYCOL 3350 17 G PO PACK
17.0000 g | PACK | Freq: Every day | ORAL | Status: DC
  Administered 2015-08-11: 17 g via ORAL
  Filled 2015-08-11: qty 1

## 2015-08-11 NOTE — Progress Notes (Signed)
Pt has been transferred from 2w25 to 2w37. Pt is now under my care. I have received report. Pt is stable and relaxing comfortably in his room. Will continue to monitor.   Grant Fontana RN, BSN

## 2015-08-11 NOTE — Progress Notes (Addendum)
      [address]       [place] [address]             [phone]      6 Days Post-Op Procedure(s) (LRB): REDO STERNOTOMY (N/A) REDO BENTALL PROCEDURE with circ arrest using a 32 Hemashield platinum graft.  (N/A) TRANSESOPHAGEAL ECHOCARDIOGRAM (TEE) (N/A)   Subjective:  Patient complains of constipation.    Objective: Vital signs in last 24 hours: Temp:  [98.5 F (36.9 C)] 98.5 F (36.9 C) (04/02 0339) Pulse Rate:  [72-88] 88 (04/02 0339) Cardiac Rhythm:  [-] Normal sinus rhythm (04/01 1941) Resp:  [16-20] 16 (04/02 0339) BP: (112-124)/(66-78) 121/78 mmHg (04/02 0339) SpO2:  [96 %-100 %] 100 % (04/02 0339) Weight:  [207 lb 1.6 oz (93.94 kg)] 207 lb 1.6 oz (93.94 kg) (04/02 0339)  Intake/Output from previous day: 04/01 0701 - 04/02 0700 In: 153 [P.O.:150; I.V.:3] Out: 525 [Urine:525]  General appearance: alert, cooperative and no distress Heart: regular rate and rhythm Lungs: clear to auscultation bilaterally Abdomen: soft, non-tender; bowel sounds normal; no masses,  no organomegaly Extremities: edema trace Wound: clean and dry  Lab Results:  Recent Labs  08/09/15 0340  WBC 7.1  HGB 10.0*  HCT 31.9*  PLT 181   BMET:  Recent Labs  08/10/15 0256 08/10/15 1326  NA 135 135  K 3.9 3.6  CL 95* 93*  CO2 32 30  GLUCOSE 99 111*  BUN 13 13  CREATININE 0.95 0.98  CALCIUM 5.9* 6.7*    PT/INR:  Recent Labs  08/11/15 0243  LABPROT 20.4*  INR 1.75*   ABG    Component Value Date/Time   PHART 7.305* 08/06/2015 0214   HCO3 23.3 08/06/2015 0214   TCO2 23 08/06/2015 1814   ACIDBASEDEF 3.0* 08/06/2015 0214   O2SAT 97.0 08/06/2015 0214   CBG (last 3)   Recent Labs  08/09/15 0610 08/09/15 1125 08/09/15 1708  GLUCAP 86 100* 97    Assessment/Plan: S/P Procedure(s) (LRB): REDO STERNOTOMY (N/A) REDO BENTALL PROCEDURE with circ arrest using a 32 Hemashield platinum graft.  (N/A) TRANSESOPHAGEAL ECHOCARDIOGRAM (TEE) (N/A)  1. CV-  NSR, BP 110-120s- will continue Lopressor, add low dose ACE if BP allows 2. Pulm- no acute issues, off oxygen, continue IS 3. Renal- creatinine has been WNL, weight is stable, continue Lasix 4. 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 Roxan Hockey has ordered Vit D and PTH levels, however these are in process 5. INR 1.75, continue coumadin at 10 mg  6. Dispo- patient stable, BMET this AM, PTH, Vit D levels pending, continue current care   LOS: 6 days    BARRETT, ERIN 08/11/2015  Patient seen and examined, agree with above I have gone ahead and ordered Vitamin D and calcium PO INR beginning to increase  Remo Lipps C. Roxan Hockey, MD [facility] [phone]

## 2015-08-12 LAB — BASIC METABOLIC PANEL
ANION GAP: 9 (ref 5–15)
BUN: 8 mg/dL (ref 6–20)
CHLORIDE: 96 mmol/L — AB (ref 101–111)
CO2: 31 mmol/L (ref 22–32)
Calcium: 5.8 mg/dL — CL (ref 8.9–10.3)
Creatinine, Ser: 0.91 mg/dL (ref 0.61–1.24)
Glucose, Bld: 113 mg/dL — ABNORMAL HIGH (ref 65–99)
POTASSIUM: 3.5 mmol/L (ref 3.5–5.1)
SODIUM: 136 mmol/L (ref 135–145)

## 2015-08-12 LAB — PROTIME-INR
INR: 2.21 — ABNORMAL HIGH (ref 0.00–1.49)
Prothrombin Time: 24.3 seconds — ABNORMAL HIGH (ref 11.6–15.2)

## 2015-08-12 MED ORDER — ASPIRIN 81 MG PO TBEC: 81.0000 mg | DELAYED_RELEASE_TABLET | Freq: Every day | ORAL | Status: AC

## 2015-08-12 MED ORDER — POTASSIUM CHLORIDE ER 20 MEQ PO TBCR: 20.0000 meq | EXTENDED_RELEASE_TABLET | Freq: Every day | ORAL | Status: DC

## 2015-08-12 MED ORDER — FUROSEMIDE 40 MG PO TABS: 40.0000 mg | ORAL_TABLET | Freq: Every day | ORAL | Status: DC

## 2015-08-12 MED ORDER — CALCIUM CARBONATE 1250 (500 CA) MG PO TABS: 1.0000 | ORAL_TABLET | Freq: Two times a day (BID) | ORAL | Status: DC

## 2015-08-12 MED ORDER — VITAMIN D3 10 MCG (400 UNIT) PO TABS: 400.0000 [IU] | ORAL_TABLET | Freq: Every day | ORAL | Status: DC

## 2015-08-12 MED ORDER — METOPROLOL TARTRATE 25 MG PO TABS: 12.5000 mg | ORAL_TABLET | Freq: Two times a day (BID) | ORAL | Status: DC

## 2015-08-12 MED ORDER — OXYCODONE HCL 5 MG PO TABS: 5.0000 mg | ORAL_TABLET | ORAL | Status: DC | PRN

## 2015-08-12 NOTE — Progress Notes (Signed)
      [address]       [place] [address]             [phone]      7 Days Post-Op Procedure(s) (LRB): REDO STERNOTOMY (N/A) REDO BENTALL PROCEDURE with circ arrest using a 32 Hemashield platinum graft.  (N/A) TRANSESOPHAGEAL ECHOCARDIOGRAM (TEE) (N/A)   Subjective:  [name] [name] has no complaints this morning.  He is ambulating without difficulty.  + BM  Objective: Vital signs in last 24 hours: Temp:  [98 F (36.7 C)-99.5 F (37.5 C)] 98 F (36.7 C) (04/03 0547) Pulse Rate:  [68-93] 76 (04/03 0547) Cardiac Rhythm:  [-] Normal sinus rhythm (04/02 2153) Resp:  [16] 16 (04/03 0547) BP: (109-133)/(59-82) 109/59 mmHg (04/03 0547) SpO2:  [95 %-100 %] 95 % (04/03 0547) Weight:  [204 lb 3.2 oz (92.625 kg)] 204 lb 3.2 oz (92.625 kg) (04/03 0240)  Intake/Output from previous day: 04/02 0701 - 04/03 0700 In: 483 [P.O.:480; I.V.:3] Out: -   General appearance: alert, cooperative and no distress Heart: regular rate and rhythm Lungs: clear to auscultation bilaterally Abdomen: soft, non-tender; bowel sounds normal; no masses,  no organomegaly Extremities: edema trace Wound: clean and dry  Lab Results: No results for input(s): WBC, HGB, HCT, PLT in the last 72 hours. BMET:  Recent Labs  08/11/15 0850 08/12/15 0400  NA 134* 136  K 3.8 3.5  CL 96* 96*  CO2 26 31  GLUCOSE 115* 113*  BUN 10 8  CREATININE 0.87 0.91  CALCIUM 6.2* 5.8*    PT/INR:  Recent Labs  08/12/15 0400  LABPROT 24.3*  INR 2.21*   ABG    Component Value Date/Time   PHART 7.305* 08/06/2015 0214   HCO3 23.3 08/06/2015 0214   TCO2 23 08/06/2015 1814   ACIDBASEDEF 3.0* 08/06/2015 0214   O2SAT 97.0 08/06/2015 0214   CBG (last 3)   Recent Labs  08/09/15 1125 08/09/15 1708  GLUCAP 100* 97    Assessment/Plan: S/P Procedure(s) (LRB): REDO STERNOTOMY (N/A) REDO BENTALL PROCEDURE with circ arrest using a 32 Hemashield platinum graft.  (N/A) TRANSESOPHAGEAL ECHOCARDIOGRAM  (TEE) (N/A)  1. Cv- remains hemodynamically stable, continue Lopressor 2. INR 2.21, continue outpatient regimen, will get PT/INR checked Wednesday 3. Renal- creatinine WNL, + hypervolemia, will start Lasix for the next week 4. 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 Dispo- patient is stable, continue coumadin, continue calcium supplementation, will d/c home today   LOS: 7 days    Galya Dunnigan 08/12/2015

## 2015-08-12 NOTE — Progress Notes (Signed)
Patient discharged home with wife. 10mg  of oxycodone and 4mg  of zofran were given as requested by the patient. Medications and discharge instructions were educated on and they both stated that they understood. Sutures were removed, site was clean. IV was dc'd and was intact.

## 2015-08-12 NOTE — Care Management (Signed)
Case Management Note Initial Note started By Elenor Quinones 08-06-15 Patient Details  Name: [name] [name] MRN: [id number] Date of Birth: 1970-07-16  Subjective/Objective: S/p 1 Day Post-Op Procedure(s) (LRB): REDO STERNOTOMY (N/A) REDO BENTALL PROCEDURE with circ arrest using a 32 Hemashield platinum graft. (N/A) TRANSESOPHAGEAL ECHOCARDIOGRAM (TEE) (N/A)   Action/Plan: Pt is independent from home with wife. Family will provide 24 hour supervision at discharge if recommended. CM will continue to follow for discharge needs   Expected Discharge Date:     Expected Discharge Plan: Home/Self Care  In-House Referral:    Discharge planning Services CM Consult  Post Acute Care Choice:N/A   Choice offered to:  N/A  DME Arranged:  N/A DME Agency:    HH Arranged:  N/A HH Agency:N/A    Status of Service:Completed.  Medicare Important Message Given:   Date Medicare IM Given:   Medicare IM give by:   Date Additional Medicare IM Given:   Additional Medicare Important Message give by:    If discussed at [place] of Stay Meetings, dates discussed:   Additional Comments: 1502 08-12-15 Pt was d/c home no needs. Jacqlyn Krauss, RN, BSN [phone]

## 2015-08-12 NOTE — Progress Notes (Signed)
Lab call for a low calcium level, pt calcium carbonate put in place for correction will be administered.

## 2015-08-13 LAB — CALCITRIOL (1,25 DI-OH VIT D): VIT D 1 25 DIHYDROXY: 53 pg/mL (ref 19.9–79.3)

## 2015-08-22 ENCOUNTER — Other Ambulatory Visit: Payer: Self-pay | Admitting: *Deleted

## 2015-08-22 DIAGNOSIS — G8918 Other acute postprocedural pain: Secondary | ICD-10-CM

## 2015-08-22 MED ORDER — OXYCODONE HCL 5 MG PO TABS: 5.0000 mg | ORAL_TABLET | ORAL | Status: DC | PRN

## 2015-08-22 NOTE — Telephone Encounter (Signed)
[name] [name] has called and requested a  refill for Oxycodone after a Bentall procedure 08/05/15. I informed him that a new signed script would be available at our office today for pick up at his convenience and he agreed.

## 2015-09-04 ENCOUNTER — Other Ambulatory Visit: Payer: Self-pay | Admitting: *Deleted

## 2015-09-04 DIAGNOSIS — G8918 Other acute postprocedural pain: Secondary | ICD-10-CM

## 2015-09-04 MED ORDER — OXYCODONE HCL 5 MG PO TABS: 5.0000 mg | ORAL_TABLET | ORAL | Status: DC | PRN

## 2015-09-04 NOTE — Telephone Encounter (Signed)
[name] [name] has requested a refill for Oxycodone, s/p BENTALL 08/05/15. I informed him that a new signed script would be available at our office tomorrow and he agreed.

## 2015-09-06 ENCOUNTER — Other Ambulatory Visit: Payer: Self-pay

## 2015-09-06 DIAGNOSIS — G8918 Other acute postprocedural pain: Secondary | ICD-10-CM

## 2015-09-06 MED ORDER — TRAMADOL HCL 50 MG PO TABS: 100.0000 mg | ORAL_TABLET | Freq: Four times a day (QID) | ORAL | Status: DC | PRN

## 2015-09-06 NOTE — Telephone Encounter (Signed)
RX for Tramadol printed out for patient

## 2015-09-10 DIAGNOSIS — E559 Vitamin D deficiency, unspecified: Secondary | ICD-10-CM | POA: Insufficient documentation

## 2015-09-11 ENCOUNTER — Other Ambulatory Visit: Payer: Self-pay | Admitting: Cardiothoracic Surgery

## 2015-09-11 DIAGNOSIS — I712 Thoracic aortic aneurysm, without rupture, unspecified: Secondary | ICD-10-CM

## 2015-09-12 ENCOUNTER — Encounter: Payer: Self-pay | Admitting: *Deleted

## 2015-09-12 ENCOUNTER — Ambulatory Visit (INDEPENDENT_AMBULATORY_CARE_PROVIDER_SITE_OTHER): Payer: Self-pay | Admitting: Cardiothoracic Surgery

## 2015-09-12 ENCOUNTER — Encounter: Payer: Self-pay | Admitting: Cardiothoracic Surgery

## 2015-09-12 ENCOUNTER — Ambulatory Visit
Admission: RE | Admit: 2015-09-12 | Discharge: 2015-09-12 | Disposition: A | Discharge status: Home / Self Care | Payer: PRIVATE HEALTH INSURANCE | Source: Ambulatory Visit | Attending: Cardiothoracic Surgery | Admitting: Cardiothoracic Surgery

## 2015-09-12 VITALS — BP 114/80 | HR 76 | Resp 16 | Ht 70.0 in | Wt 202.2 lb

## 2015-09-12 DIAGNOSIS — Z9889 Other specified postprocedural states: Secondary | ICD-10-CM

## 2015-09-12 DIAGNOSIS — Z952 Presence of prosthetic heart valve: Secondary | ICD-10-CM

## 2015-09-12 DIAGNOSIS — I712 Thoracic aortic aneurysm, without rupture, unspecified: Secondary | ICD-10-CM

## 2015-09-12 DIAGNOSIS — Z8774 Personal history of (corrected) congenital malformations of heart and circulatory system: Secondary | ICD-10-CM

## 2015-09-12 DIAGNOSIS — Z8679 Personal history of other diseases of the circulatory system: Secondary | ICD-10-CM

## 2015-09-12 DIAGNOSIS — Z954 Presence of other heart-valve replacement: Secondary | ICD-10-CM

## 2015-09-12 NOTE — Progress Notes (Signed)
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 POSTOPERATIVE DIAGNOSIS: same PROCEDURE: Redo sternotomy with supra coronary replacement of ascending aorta with 32-mm Hemashield graft with hypothermic circulatory arrest, and right axillary artery cannulation. SURGEON: Lanelle Bal, M.D.  History of Present Illness:      redo sternotomy with replacement of ascending aorta with circulatory arrest for dilated descending aorta.  Is making good progress postoperatively increasing his activity appropriately without evidence of congestive heart failure.  His pain control has continued to improve and now is only on rare Ultram.  He's made contact with the Coumadin clinic in [facility] and is actively being managed for his Coumadin dosing.  In addition he's contacted Dr. Tamala Julian his endocrinologist for follow-up of his hypocalcemia.       Past Medical History  Diagnosis Date  . Cancer ([place])     thyroid  . Aortic stenosis   . Long term current use of anticoagulant 12/03/2014  . H/O aortic valve replacement 12/03/2014  . HLD (hyperlipidemia)     was on Crestor but has been off for months(oct 2016)  . Degenerative arthritis of hip 03/17/2011  . 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 Thoracic aortic aneurysm, without rupture ([place]) 05/01/2015  . Incidental pulmonary nodule, > 1mm and < 73mm left lower lobe 05/01/2015  . H/O bicuspid aortic valve 05/01/2015  . Complication of anesthesia   . PONV (postoperative nausea and vomiting)     nausea  . S/P thyroidectomy   . BP (high blood pressure)  12/19/2014    takes Lisinopril and Metoprolol daily  . Hypothyroidism     takes Synthroid daily  . GERD (gastroesophageal reflux disease)     takes Zantac daily  . Environmental allergies     Albuterol inhaler as needed  . History of bronchitis   . Vertigo     d//t inner ear  . TIA (transient ischemic attack)      History  Smoking status  . Never Smoker   Smokeless tobacco  . Not on file    History  Alcohol Use No     No Known Allergies  Current Outpatient Prescriptions  Medication Sig Dispense Refill  . albuterol (PROVENTIL HFA;VENTOLIN HFA) 108 (90 BASE) MCG/ACT inhaler Inhale 2 puffs into the lungs every 6 (six) hours as needed for wheezing or shortness of breath.     Marland Kitchen aspirin EC 81 MG EC tablet Take 1 tablet (81 mg total) by mouth daily.    Marland Kitchen CALCIUM CARBONATE PO Take 1,000 mg by mouth 2 (two) times daily.    Marland Kitchen levothyroxine (SYNTHROID, LEVOTHROID) 112 MCG tablet Take 224 mcg by mouth daily before breakfast.    . lisinopril (PRINIVIL,ZESTRIL) 5 MG tablet Take 5 mg by mouth daily.    . metoprolol tartrate (LOPRESSOR) 25 MG tablet Take 0.5 tablets (12.5 mg total) by mouth 2 (two) times daily. 30 tablet 3  . traMADol (ULTRAM) 50 MG tablet Take 2 tablets (100 mg total) by mouth every 6 (six) hours as needed for severe pain. 50 tablet 0  .  Vitamin D, Ergocalciferol, (DRISDOL) 50000 units CAPS capsule Take 50,000 Units by mouth every 7 (seven) days.    Marland Kitchen warfarin (COUMADIN) 1 MG tablet Take 1 mg by mouth as directed. Reported on 08/01/2015    . warfarin (COUMADIN) 10 MG tablet Take 10 mg by mouth daily. Reported on 08/01/2015    . ranitidine (ZANTAC) 150 MG tablet Take 150 mg by mouth every evening.      No current facility-administered medications for this visit.       Physical Exam: BP 114/80 mmHg  Pulse 76  Resp 16  Ht 5\' 10"  (1.778 m)  Wt 202 lb 3.2 oz (91.717 kg)  BMI 29.01 kg/m2  SpO2 98%  General appearance: alert and cooperative Neurologic: intact Heart:  regular rate and rhythm, S1, S2 normal,  Valve click is crisp,  rub or gallop Lungs: clear to auscultation bilaterally Abdomen: soft, non-tender; bowel sounds normal; no masses,  no organomegaly Extremities: extremities normal, atraumatic, no cyanosis or edema and Homans sign is negative, no sign of DVT Wound:  infraclavicular areas are well-healed   Diagnostic Studies & Laboratory data:     Recent Radiology Findings:   Dg Chest 2 View  09/12/2015  CLINICAL DATA:  Thoracic aortic aneurysm EXAM: CHEST  2 VIEW COMPARISON:  08/09/2015 FINDINGS: Again noted status post median sternotomy and aortic valve replacement. With cardiomediastinal silhouette is stable. There is elevation of the right hemidiaphragm. Small right basilar pleural effusion and mild basilar atelectasis. Small amount of fluid in right minor fissure again noted. Left lung is clear. No pulmonary edema. IMPRESSION: Small right basilar pleural effusion and mild basilar atelectasis. Small amount of fluid in right minor fissure again noted. Left lung is clear. No pulmonary edema. Status post median sternotomy and aortic valve replacement Electronically Signed   By: Lahoma Crocker M.D.   On: 09/12/2015 13:54      Recent Lab Findings: Lab Results  Component Value Date   WBC 7.1 08/09/2015   HGB 10.0* 08/09/2015   HCT 31.9* 08/09/2015   PLT 181 08/09/2015   GLUCOSE 113* 08/12/2015   ALT 18 08/10/2015   AST 25 08/10/2015   NA 136 08/12/2015   K 3.5 08/12/2015   CL 96* 08/12/2015   CREATININE 0.91 08/12/2015   BUN 8 08/12/2015   CO2 31 08/12/2015   INR 2.21* 08/12/2015   HGBA1C 5.5 08/01/2015      Assessment / Plan:     Patient doing well postoperatively,  Have allowed him to return to driving,  Sections not to do any heavy lifting for 3 months  plan to see him back in one month with a follow-up chest x-ray to evaluate the small right pleural effusion.   [name] MD      [address] [place]  [address] Office [phone]   Beeper [phone]  09/12/2015 2:29 PM

## 2015-09-12 NOTE — Progress Notes (Signed)
Patient ID: [name] [name], male   DOB: 1971/04/22, 45 y.o.   MRN: [id number] [name] [name] did not pick up the script for Oxycodone that he had requested on 09/05/15.  He felt the Ultram that was provided was sufficient for his pain level.

## 2015-09-27 ENCOUNTER — Other Ambulatory Visit: Payer: Self-pay | Admitting: *Deleted

## 2015-09-27 DIAGNOSIS — G8918 Other acute postprocedural pain: Secondary | ICD-10-CM

## 2015-09-27 MED ORDER — TRAMADOL HCL 50 MG PO TABS: 100.0000 mg | ORAL_TABLET | Freq: Four times a day (QID) | ORAL | Status: DC | PRN

## 2015-09-27 NOTE — Telephone Encounter (Signed)
Mr. Batz has called requesting a refill for Tramadol.  I informed him that I would fax a new prescription to his pharmacy before close of business today and he agreed.

## 2015-10-16 ENCOUNTER — Other Ambulatory Visit: Payer: Self-pay | Admitting: Cardiothoracic Surgery

## 2015-10-16 DIAGNOSIS — I712 Thoracic aortic aneurysm, without rupture, unspecified: Secondary | ICD-10-CM

## 2015-10-17 ENCOUNTER — Ambulatory Visit
Admission: RE | Admit: 2015-10-17 | Discharge: 2015-10-17 | Disposition: A | Discharge status: Home / Self Care | Payer: PRIVATE HEALTH INSURANCE | Source: Ambulatory Visit | Attending: Cardiothoracic Surgery | Admitting: Cardiothoracic Surgery

## 2015-10-17 ENCOUNTER — Ambulatory Visit (INDEPENDENT_AMBULATORY_CARE_PROVIDER_SITE_OTHER): Payer: Self-pay | Admitting: Cardiothoracic Surgery

## 2015-10-17 VITALS — BP 129/91 | HR 86 | Resp 16 | Ht 70.0 in | Wt 201.0 lb

## 2015-10-17 DIAGNOSIS — G8918 Other acute postprocedural pain: Secondary | ICD-10-CM

## 2015-10-17 DIAGNOSIS — Z952 Presence of prosthetic heart valve: Secondary | ICD-10-CM

## 2015-10-17 DIAGNOSIS — I712 Thoracic aortic aneurysm, without rupture, unspecified: Secondary | ICD-10-CM

## 2015-10-17 DIAGNOSIS — R911 Solitary pulmonary nodule: Secondary | ICD-10-CM

## 2015-10-17 DIAGNOSIS — Z9889 Other specified postprocedural states: Secondary | ICD-10-CM

## 2015-10-17 DIAGNOSIS — Z954 Presence of other heart-valve replacement: Secondary | ICD-10-CM

## 2015-10-17 DIAGNOSIS — Z8679 Personal history of other diseases of the circulatory system: Secondary | ICD-10-CM

## 2015-10-17 MED ORDER — TRAMADOL HCL 50 MG PO TABS: 50.0000 mg | ORAL_TABLET | Freq: Two times a day (BID) | ORAL | Status: DC | PRN

## 2015-10-17 NOTE — Progress Notes (Signed)
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 POSTOPERATIVE DIAGNOSIS: same PROCEDURE: Redo sternotomy with supra coronary replacement of ascending aorta with 32-mm Hemashield graft with hypothermic circulatory arrest, and right axillary artery cannulation. SURGEON: Lanelle Bal, M.D.  History of Present Illness:     Redo sternotomy with replacement of ascending aorta with circulatory arrest for dilated descending aorta.    He's made contact with the Coumadin clinic in [facility] and is actively being managed for his Coumadin dosing.   He slowly improving his physical activity level.    Past Medical History  Diagnosis Date  . Cancer ([place])     thyroid  . Aortic stenosis   . Long term current use of anticoagulant 12/03/2014  . H/O aortic valve replacement 12/03/2014  . HLD (hyperlipidemia)     was on Crestor but has been off for months(oct 2016)  . Degenerative arthritis of hip 03/17/2011  . 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 Thoracic aortic aneurysm, without rupture ([place]) 05/01/2015  . Incidental pulmonary nodule, > 62mm and < 48mm left lower lobe 05/01/2015  . H/O bicuspid aortic valve 05/01/2015  . Complication of anesthesia   . PONV (postoperative nausea and vomiting)     nausea  . S/P thyroidectomy   . BP (high blood pressure) 12/19/2014    takes Lisinopril and Metoprolol daily  . Hypothyroidism     takes Synthroid daily  . GERD (gastroesophageal reflux disease)     takes Zantac daily  . Environmental allergies     Albuterol inhaler as needed  . History of bronchitis    . Vertigo     d//t inner ear  . TIA (transient ischemic attack)      History  Smoking status  . Never Smoker   Smokeless tobacco  . Not on file    History  Alcohol Use No     No Known Allergies  Current Outpatient Prescriptions  Medication Sig Dispense Refill  . albuterol (PROVENTIL HFA;VENTOLIN HFA) 108 (90 BASE) MCG/ACT inhaler Inhale 2 puffs into the lungs every 6 (six) hours as needed for wheezing or shortness of breath.     Marland Kitchen aspirin EC 81 MG EC tablet Take 1 tablet (81 mg total) by mouth daily.    Marland Kitchen CALCIUM CARBONATE PO Take 1,000 mg by mouth 2 (two) times daily.    Marland Kitchen levothyroxine (SYNTHROID, LEVOTHROID) 112 MCG tablet Take 224 mcg by mouth daily before breakfast.    . lisinopril (PRINIVIL,ZESTRIL) 5 MG tablet Take 5 mg by mouth daily.    . metoprolol tartrate (LOPRESSOR) 25 MG tablet Take 0.5 tablets (12.5 mg total) by mouth 2 (two) times daily. 30 tablet 3  . ranitidine (ZANTAC) 150 MG tablet Take 150 mg by mouth every evening.     . rosuvastatin (CRESTOR) 10 MG tablet Take 10 mg by mouth once a week.    . traMADol (ULTRAM) 50 MG tablet Take 2 tablets (100 mg total) by mouth every 6 (six) hours as needed for severe pain. 50 tablet 0  . Vitamin D,  Ergocalciferol, (DRISDOL) 50000 units CAPS capsule Take 50,000 Units by mouth every 7 (seven) days.    Marland Kitchen warfarin (COUMADIN) 1 MG tablet Take 1 mg by mouth as directed. Reported on 08/01/2015    . warfarin (COUMADIN) 10 MG tablet Take 10 mg by mouth daily. Reported on 08/01/2015     No current facility-administered medications for this visit.       Physical Exam: BP 129/91 mmHg  Pulse 86  Resp 16  Ht 5\' 10"  (1.778 m)  Wt 201 lb (91.173 kg)  BMI 28.84 kg/m2  SpO2 99%  General appearance: alert and cooperative Neurologic: intact Heart: regular rate and rhythm, S1, S2 normal,  Valve click is crisp,  rub or gallop Lungs: clear to auscultation bilaterally Abdomen: soft, non-tender; bowel sounds normal; no masses,  no  organomegaly Extremities: extremities normal, atraumatic, no cyanosis or edema and Homans sign is negative, no sign of DVT Wound:  infraclavicular areas are well-healed   Diagnostic Studies & Laboratory data:     Recent Radiology Findings:   Dg Chest 2 View  10/17/2015  CLINICAL DATA:  Thoracic aortic aneurysm post Bentall procedure and transesophageal echo without cardioversion 08/05/2015. EXAM: CHEST  2 VIEW COMPARISON:  09/12/2015 FINDINGS: Previous median sternotomy and aortic valve replacement. Lungs are hypoinflated without consolidation or effusion. Cardiomediastinal silhouette and remainder of the exam is unchanged. IMPRESSION: No active cardiopulmonary disease. Electronically Signed   By: Marin Olp M.D.   On: 10/17/2015 13:35      Recent Lab Findings: Lab Results  Component Value Date   WBC 7.1 08/09/2015   HGB 10.0* 08/09/2015   HCT 31.9* 08/09/2015   PLT 181 08/09/2015   GLUCOSE 113* 08/12/2015   ALT 18 08/10/2015   AST 25 08/10/2015   NA 136 08/12/2015   K 3.5 08/12/2015   CL 96* 08/12/2015   CREATININE 0.91 08/12/2015   BUN 8 08/12/2015   CO2 31 08/12/2015   INR 2.21* 08/12/2015   HGBA1C 5.5 08/01/2015      Assessment / Plan:     Patient doing well postoperatively,  Have allowed him to return to driving,   He will return to work on July 3 half day for 2 weeks and then a full day, taking care not to lift over 20-25 pounds Chest x-ray shows complete resolution of pleural effusions Plan to see him back in 8 months with a postop follow-up CTA of the chest.  [name] MD      [address] [place] [address] Office [phone]   Beeper [phone]  10/17/2015 2:16 PM

## 2016-01-30 ENCOUNTER — Other Ambulatory Visit: Payer: Self-pay | Admitting: Physician Assistant

## 2016-02-02 ENCOUNTER — Other Ambulatory Visit: Payer: Self-pay | Admitting: Physician Assistant

## 2016-02-04 ENCOUNTER — Other Ambulatory Visit: Payer: Self-pay | Admitting: Physician Assistant

## 2016-02-20 ENCOUNTER — Other Ambulatory Visit: Payer: Self-pay | Admitting: Physician Assistant

## 2016-02-20 ENCOUNTER — Other Ambulatory Visit: Payer: Self-pay | Admitting: *Deleted

## 2016-02-20 DIAGNOSIS — I10 Essential (primary) hypertension: Secondary | ICD-10-CM

## 2016-02-20 MED ORDER — METOPROLOL TARTRATE 25 MG PO TABS: 12.5000 mg | ORAL_TABLET | Freq: Two times a day (BID) | ORAL | 5 refills | Status: DC

## 2016-05-29 ENCOUNTER — Other Ambulatory Visit: Payer: Self-pay | Admitting: *Deleted

## 2016-05-29 DIAGNOSIS — I712 Thoracic aortic aneurysm, without rupture, unspecified: Secondary | ICD-10-CM

## 2016-05-29 DIAGNOSIS — Z952 Presence of prosthetic heart valve: Secondary | ICD-10-CM

## 2016-07-02 ENCOUNTER — Ambulatory Visit (INDEPENDENT_AMBULATORY_CARE_PROVIDER_SITE_OTHER): Payer: 59 | Admitting: Cardiothoracic Surgery

## 2016-07-02 ENCOUNTER — Ambulatory Visit
Admission: RE | Admit: 2016-07-02 | Discharge: 2016-07-02 | Disposition: A | Discharge status: Home / Self Care | Payer: 59 | Source: Ambulatory Visit | Attending: Cardiothoracic Surgery | Admitting: Cardiothoracic Surgery

## 2016-07-02 ENCOUNTER — Encounter: Payer: Self-pay | Admitting: Cardiothoracic Surgery

## 2016-07-02 VITALS — BP 122/80 | HR 73 | Ht 70.0 in | Wt 210.0 lb

## 2016-07-02 DIAGNOSIS — Z7901 Long term (current) use of anticoagulants: Secondary | ICD-10-CM

## 2016-07-02 DIAGNOSIS — R911 Solitary pulmonary nodule: Secondary | ICD-10-CM | POA: Diagnosis not present

## 2016-07-02 DIAGNOSIS — Z952 Presence of prosthetic heart valve: Secondary | ICD-10-CM | POA: Diagnosis not present

## 2016-07-02 DIAGNOSIS — Z9889 Other specified postprocedural states: Secondary | ICD-10-CM | POA: Diagnosis not present

## 2016-07-02 DIAGNOSIS — I712 Thoracic aortic aneurysm, without rupture, unspecified: Secondary | ICD-10-CM

## 2016-07-02 DIAGNOSIS — Z8679 Personal history of other diseases of the circulatory system: Secondary | ICD-10-CM | POA: Diagnosis not present

## 2016-07-02 MED ORDER — IOPAMIDOL (ISOVUE-370) INJECTION 76%
75.0000 mL | Freq: Once | INTRAVENOUS | Status: AC | PRN
  Administered 2016-07-02: 75 mL via INTRAVENOUS

## 2016-07-02 NOTE — Progress Notes (Signed)
[address]       [place] [address]             [phone]      [facility] Record [id number] Date of Birth: [birth year]  Referring: Angelia Mould,* Primary Care: No PCP Per Patient  Chief Complaint:   POST OP FOLLOW UP 08/05/2015 OPERATIVE REPORT PREOPERATIVE DIAGNOSIS: Dilated ascending aorta with previously replaced bicuspid aortic valve with mechanical aortic valve. POSTOPERATIVE DIAGNOSIS: same PROCEDURE: Redo sternotomy with supra coronary replacement of ascending aorta with 32-mm Hemashield graft with hypothermic circulatory arrest, and right axillary artery cannulation. SURGEON: Lanelle Bal, M.D.  History of Present Illness:     Redo sternotomy with replacement of ascending aorta with circulatory arrest for dilated descending aorta. Patient made good progress over the past year following his descending aortic surgery. He is returning to near-normal activities. Has had no signs or symptoms of congestive heart failure. Has remained diligent about his Coumadin control. We again reviewed the precautions for endocarditis.  He comes in today with a repeat CTA of the chest approximate one year following ascending aortic replacement  The patient was noted at the time of surgery had a reduced calcium levels possibly related to his previous total thyroidectomy. He notes his endocrinologist has this under control and he does take supplemental calcium.         Past Medical History:  Diagnosis Date  . Aortic stenosis   . BP (high blood pressure) 12/19/2014   takes Lisinopril and Metoprolol daily  . Cancer ([place])    thyroid  . Complication of anesthesia   . Degenerative arthritis of hip 03/17/2011  . Environmental allergies    Albuterol inhaler as needed  . GERD (gastroesophageal reflux disease)    takes Zantac daily  . H/O aortic valve replacement 12/03/2014  . H/O bicuspid aortic valve 05/01/2015  . History of  bronchitis   . HLD (hyperlipidemia)    was on Crestor but has been off for months(oct 2016)  . Hypothyroidism    takes Synthroid daily  . Incidental pulmonary nodule, > 40mm and < 59mm left lower lobe 05/01/2015  . Long term current use of anticoagulant 12/03/2014  . 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 PONV (postoperative nausea and vomiting)    nausea  . S/P thyroidectomy   . Thoracic aortic aneurysm, without rupture 05/01/2015  . TIA (transient ischemic attack)   . Vertigo    d//t inner ear     History  Smoking Status  . Never Smoker  Smokeless Tobacco  . Never Used    History  Alcohol Use No     No Known Allergies  Current Outpatient Prescriptions  Medication Sig Dispense Refill  . albuterol (PROVENTIL HFA;VENTOLIN HFA) 108 (90 BASE) MCG/ACT inhaler Inhale 2 puffs into the lungs every 6 (six) hours as needed for wheezing or shortness of breath.     Marland Kitchen aspirin EC 81 MG EC tablet Take 1 tablet (81 mg total) by mouth daily.    Marland Kitchen CALCIUM CARBONATE PO Take 1,000 mg by mouth 2 (two) times daily.    Marland Kitchen levothyroxine (SYNTHROID, LEVOTHROID) 112 MCG tablet Take 224 mcg by mouth daily before breakfast.    . lisinopril (PRINIVIL,ZESTRIL) 5 MG tablet Take 5 mg by mouth daily.    . metoprolol tartrate (LOPRESSOR) 25 MG tablet Take 0.5 tablets (12.5 mg total) by mouth 2 (two) times daily. 30 tablet 5  . ranitidine (ZANTAC)  150 MG tablet Take 150 mg by mouth every evening.     . rosuvastatin (CRESTOR) 10 MG tablet Take 10 mg by mouth once a week.    . warfarin (COUMADIN) 1 MG tablet Take 1 mg by mouth as directed. Reported on 08/01/2015    . warfarin (COUMADIN) 10 MG tablet Take 10 mg by mouth daily. Reported on 08/01/2015     No current facility-administered medications for this visit.        Physical Exam: BP 122/80 (BP Location: Right Arm, Patient Position: Sitting, Cuff Size: Large)   Pulse 73   Ht 5\' 10"  (1.778 m)   Wt 210 lb (95.3 kg)   SpO2 (!) 16%   BMI  30.13 kg/m   General appearance: alert and cooperative Neurologic: intact Heart: regular rate and rhythm, S1, S2 normal,  Valve click is crisp,  rub or gallop Lungs: clear to auscultation bilaterally Abdomen: soft, non-tender; bowel sounds normal; no masses,  no organomegaly Extremities: extremities normal, atraumatic, no cyanosis or edema and Homans sign is negative, no sign of DVT Wound:  infraclavicular areas are well-healed He has no cervical or supraclavicular adenopathy  Diagnostic Studies & Laboratory data:     Recent Radiology Findings:   Ct Angio Chest Aorta W &/or Wo Contrast  Result Date: 07/02/2016 CLINICAL DATA:  History of thoracic aortic aneurysm status post redo Bentall procedure 08/05/2015, presenting for follow-up. No acute symptoms are reported. History of thyroid cancer status post thyroidectomy 12/24/2014. EXAM: CT ANGIOGRAPHY CHEST WITH CONTRAST TECHNIQUE: Multidetector CT imaging of the chest was performed using the standard protocol during bolus administration of intravenous contrast. Multiplanar CT image reconstructions and MIPs were obtained to evaluate the vascular anatomy. CONTRAST:  75 cc Isovue 370 IV. COMPARISON:  03/14/2015 chest CT. 10/17/2015 chest radiograph. FINDINGS: Motion degraded scan. Cardiovascular: Normal heart size. No significant pericardial fluid/thickening. No mediastinal hematoma. No significant coronary artery calcification. Aortic valve prosthesis is in place. Status post graft repair of the ascending thoracic aorta. No acute intramural hematoma. Aortic root diameter 4.1 cm. Maximum ascending aorta diameter 3.4 cm. No pseudoaneurysm. No evidence of thoracic aortic dissection. Normal caliber of the aortic arch and mildly tortuous descending thoracic aorta. Normal caliber pulmonary arteries. No central pulmonary emboli. Mediastinum/Nodes: Total thyroidectomy. No appreciable nodularity in the visualized thyroidectomy bed. Unremarkable esophagus. No  pathologically enlarged axillary, mediastinal or hilar lymph nodes. Lungs/Pleura: No pneumothorax. No pleural effusion. No acute consolidative airspace disease or lung masses. There are 3 scattered tiny solid pulmonary nodules, largest 3 mm in the left lower lobe (series 5/image 63), all stable back to 03/14/2015 and considered benign. No new significant pulmonary nodules. Upper abdomen: Hypodense subcentimeter inferior right liver lesion, too small to characterize, unchanged since 03/14/2015, suggesting a benign lesion. Hypodense subcentimeter renal cortical lesion in the lateral upper right kidney, too small to characterize, stable since 03/14/2015, suggesting a benign renal cyst. Musculoskeletal: No aggressive appearing focal osseous lesions. Mild thoracic spondylosis Sternotomy wires appear intact. Review of the MIP images confirms the above findings. IMPRESSION: 1. Satisfactory appearance status post redo aortic valve replacement and graft repair of the ascending thoracic aorta, with no evidence of complication. 2. No acute pulmonary disease. 3. No findings suspicious for metastatic disease in the chest. Electronically Signed   By: Ilona Sorrel M.D.   On: 07/02/2016 13:52      Recent Lab Findings: Lab Results  Component Value Date   WBC 7.1 08/09/2015   HGB 10.0 (L) 08/09/2015   HCT  31.9 (L) 08/09/2015   PLT 181 08/09/2015   GLUCOSE 113 (H) 08/12/2015   ALT 18 08/10/2015   AST 25 08/10/2015   NA 136 08/12/2015   K 3.5 08/12/2015   CL 96 (L) 08/12/2015   CREATININE 0.91 08/12/2015   BUN 8 08/12/2015   CO2 31 08/12/2015   INR 2.21 (H) 08/12/2015   HGBA1C 5.5 08/01/2015      Assessment / Plan:    Patient doing well following replacement of his ascending aorta, previously had cardiac surgery at age 33 and again at age 57 and then presented last year with a dilated ascending aorta at 5.5 cm. Is previously placed mechanical valve was left in the ascending aorta replaced with a 32 mm  Hemashield graft.  CT scan of the chest today is stable. We'll plan to see him back with a repeat CT scan in 2 years     [name] MD      [address] [place] [address] Office [phone]   Beeper [phone]  07/02/2016 2:16 PM

## 2016-07-02 NOTE — Patient Instructions (Signed)

## 2016-10-18 IMAGING — CR DG CHEST 1V PORT
1 series · 1 of 1 positions shown · non-contrast
Comparison: 08/06/2015.

CLINICAL DATA: Prior cardiac valve surgery.

EXAM:
PORTABLE CHEST 1 VIEW

[AP]
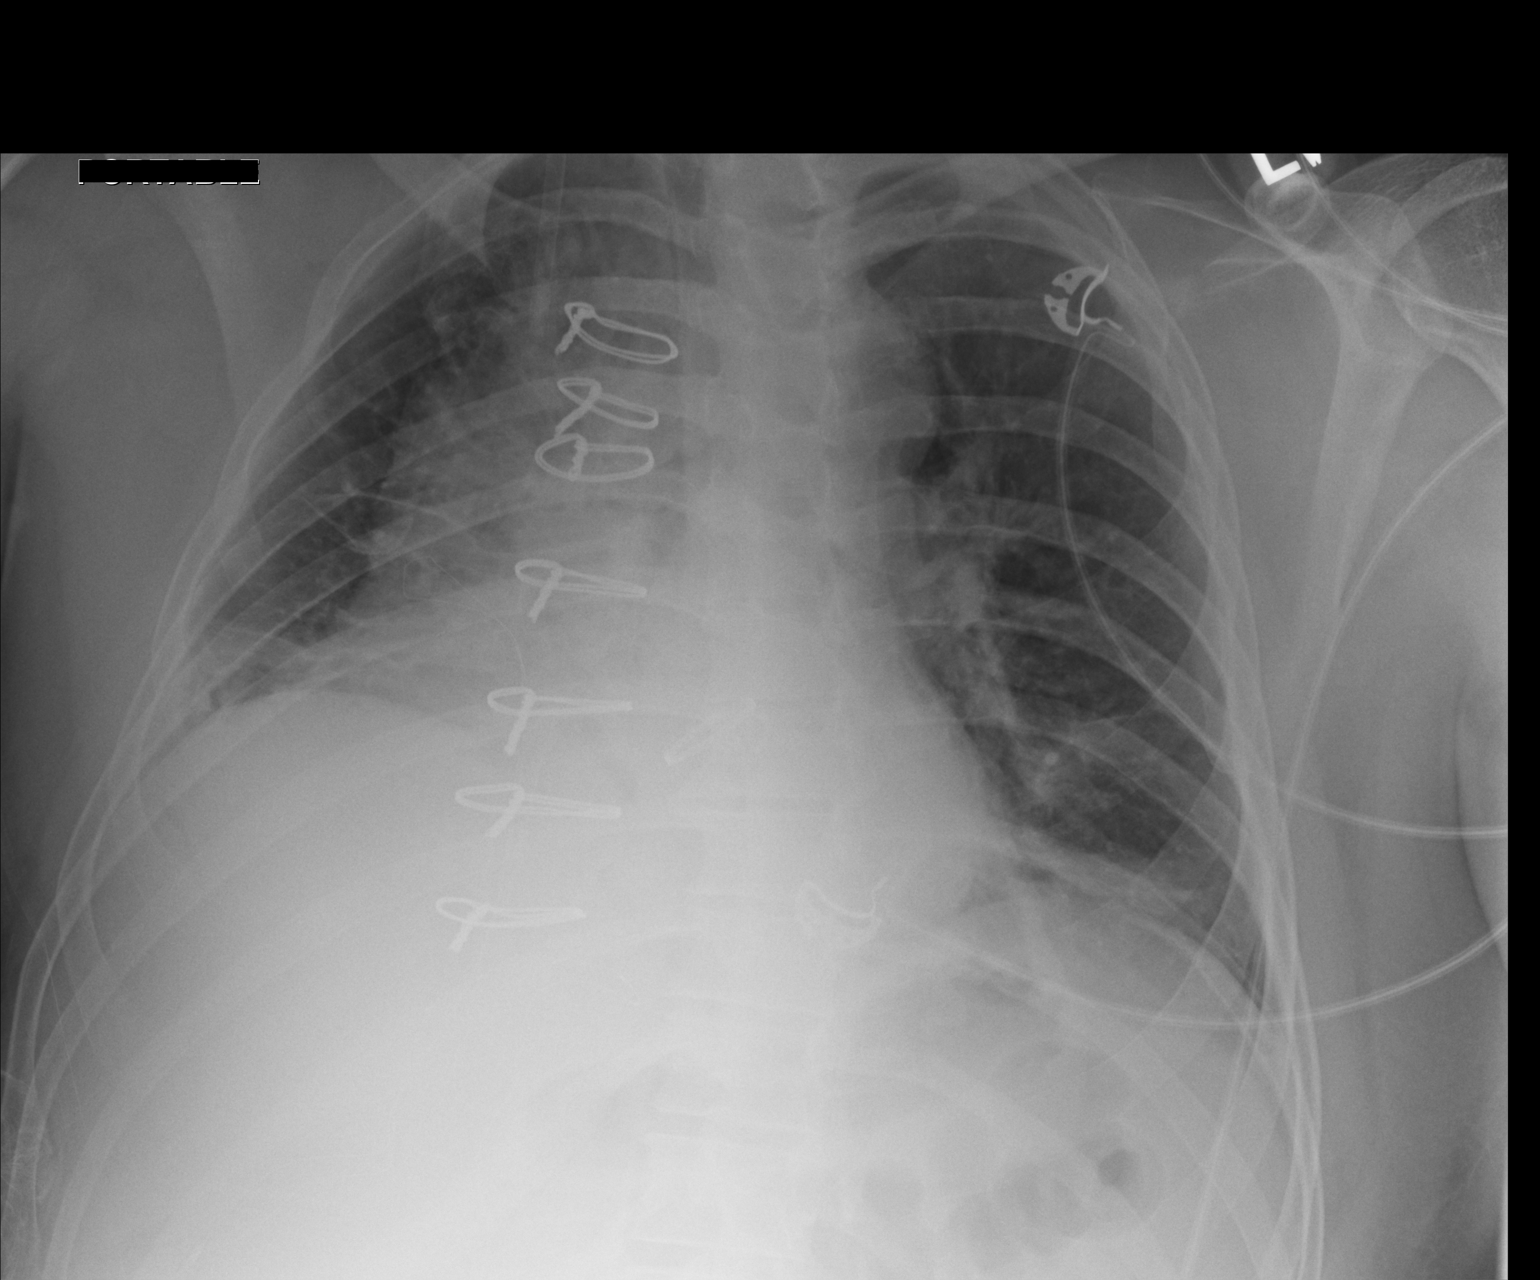

[1 of 1 positions shown; findings below may reference images not displayed]

FINDINGS: Interim removal of Swan-Ganz catheter and mediastinal drainage
catheter. Right IJ sheath in stable position. Prior median
sternotomy and cardiac valve replacement. Low lung volumes with
persistent mild bibasilar atelectasis and/or infiltrates. No pleural
effusion or pneumothorax.
IMPRESSION: 1. Interim removal Swan-Ganz catheter mediastinal drainage catheter.
Right IJ sheath in stable position.

2. Prior median sternotomy and cardiac valve replacement. Stable
cardiomegaly. No evidence of overt congestive heart failure.

3. Persistent low lung volumes with mild bibasilar atelectasis
and/or infiltrates.

## 2016-10-19 IMAGING — CR DG CHEST 1V PORT
1 series · 1 of 1 positions shown · non-contrast
Comparison: August 07, 2015.

CLINICAL DATA: Chest tubes.

EXAM:
PORTABLE CHEST 1 VIEW

[AP]
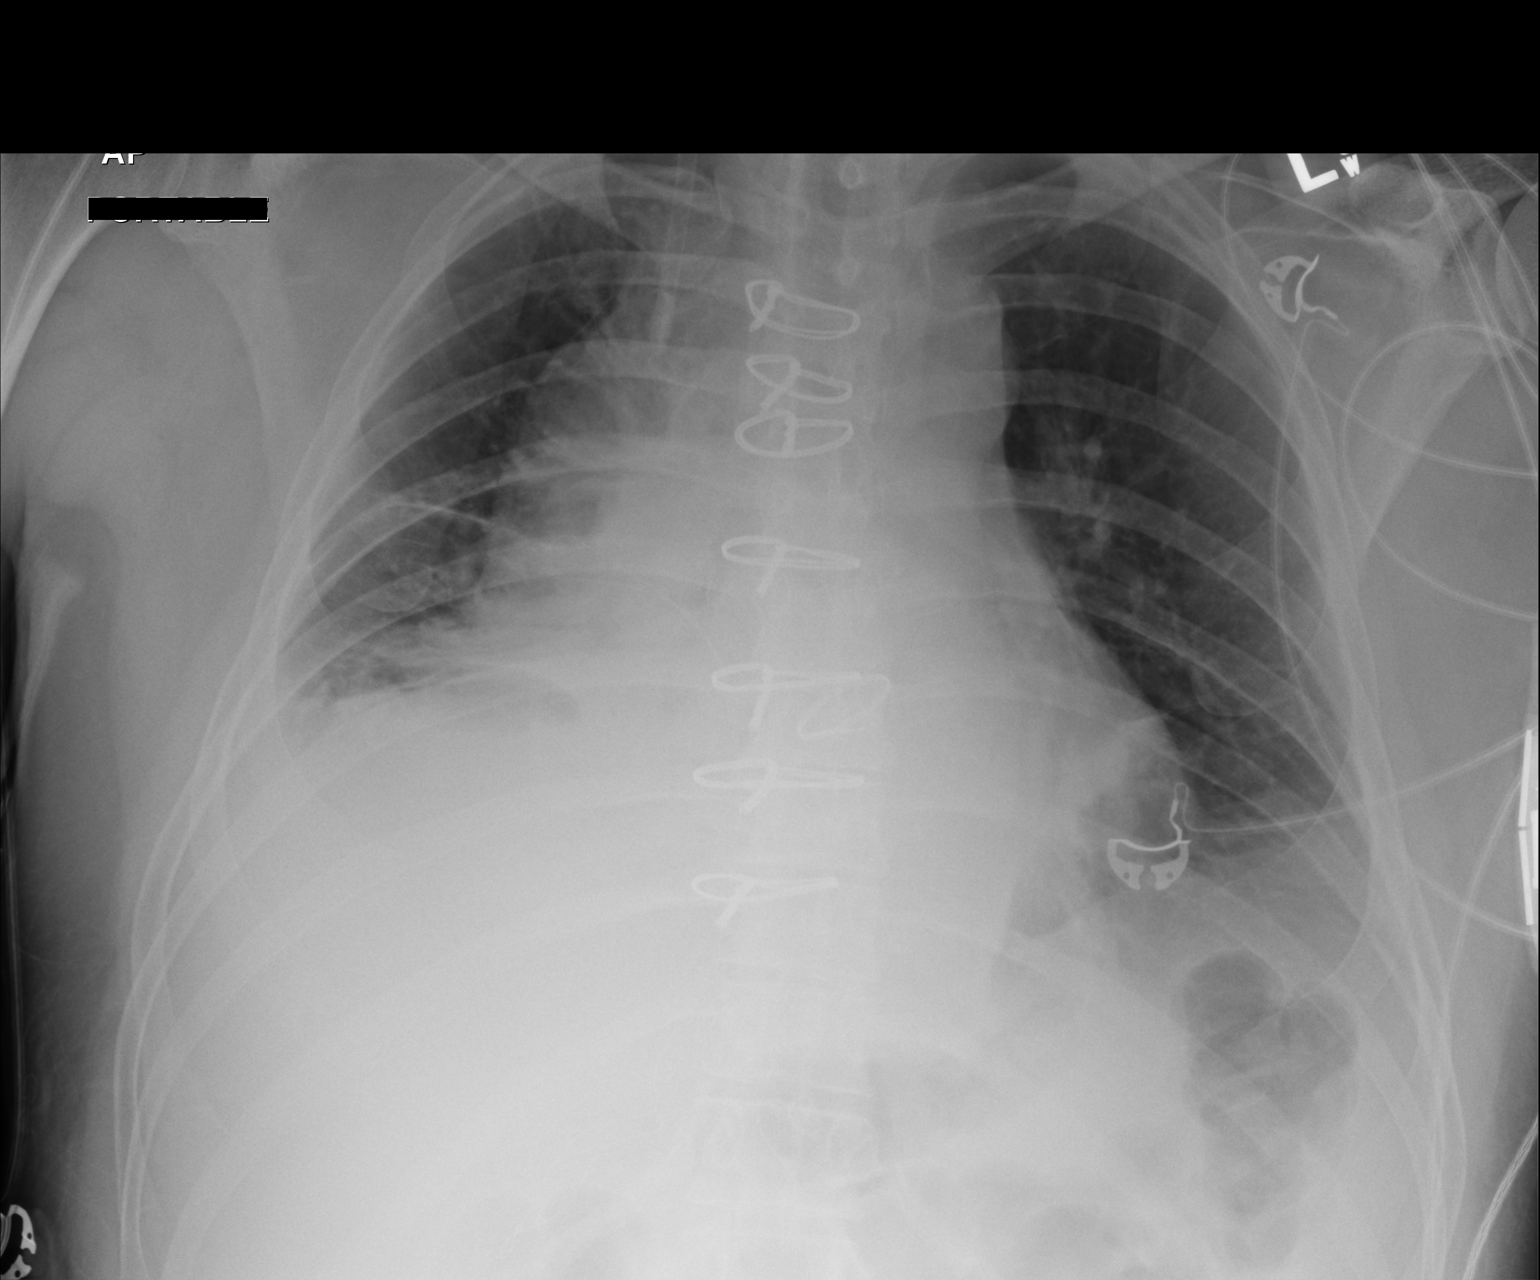

[1 of 1 positions shown; findings below may reference images not displayed]

FINDINGS: Stable cardiomegaly. Sternotomy wires are noted. No chest tubes are
noted currently. No pneumothorax is noted. Right internal jugular
venous sheath is unchanged. Stable mild left basilar subsegmental
atelectasis is noted. Stable moderate right basilar subsegmental
atelectasis or infiltrate is noted. Possible minimal right pleural
effusion. Bony thorax is unremarkable.
IMPRESSION: Stable bibasilar opacities as described above.

## 2016-11-16 ENCOUNTER — Ambulatory Visit (INDEPENDENT_AMBULATORY_CARE_PROVIDER_SITE_OTHER): Payer: 59

## 2016-11-16 ENCOUNTER — Ambulatory Visit (INDEPENDENT_AMBULATORY_CARE_PROVIDER_SITE_OTHER): Payer: 59 | Admitting: Physician Assistant

## 2016-11-16 ENCOUNTER — Encounter (INDEPENDENT_AMBULATORY_CARE_PROVIDER_SITE_OTHER): Payer: Self-pay | Admitting: Physician Assistant

## 2016-11-16 VITALS — Ht 69.0 in | Wt 205.0 lb

## 2016-11-16 DIAGNOSIS — M25552 Pain in left hip: Secondary | ICD-10-CM

## 2016-11-16 NOTE — Progress Notes (Signed)
Office Visit Note   Patient: [name] [name]           Date of Birth: 1970-10-27           MRN: [id number] Visit Date: 11/16/2016              Requested by: No referring provider defined for this encounter. PCP: Patient, No Pcp Per   Assessment & Plan: Visit Diagnoses:  1. Pain in left hip     Plan: Mr. Lumley will follow with [place] if he develops any prolonged pain in the hip or back pain becomes worse. Otherwise follow-up on an as-needed basis.  Follow-Up Instructions: Return if symptoms worsen or fail to improve.   Orders:  Orders Placed This Encounter  Procedures  . XR HIP UNILAT W OR W/O PELVIS 2-3 VIEWS LEFT   No orders of the defined types were placed in this encounter.     Procedures: No procedures performed   Clinical Data: No additional findings.   Subjective: Chief Complaint  Patient presents with  . Left Hip - Pain    HPI Mr. 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 He reports that he was at the beach and was doing a lot of activities and water and started developing some left groin pain. States he was actually having difficulty with flexing his hip. Pain overall is slowly subsiding. He had no particular injury to the hip. Has been taking Tylenol without any real relief. He is concerned he may have done something to his left total hip arthroplasty which he is now 18 months out from. Review of Systems No fevers chills shortness breath chest pain.  Objective: Vital Signs: Ht 5\' 9"  (1.753 m)   Wt 205 lb (93 kg)   BMI 30.27 kg/m   Physical Exam  Constitutional: He is oriented to person, place, and time. He appears well-developed and well-nourished. No distress.  Pulmonary/Chest: Effort normal.  Neurological: He is alert and oriented to person, place, and time.  Skin: He is not diaphoretic.    Ortho Exam Left hip fluid range of motion. He is able to flex his hip has some stiffness with this. No rashes skin lesions  ulcerations surgical incision is healed well without signs of infection. Specialty Comments:  No specialty comments available.  Imaging: Xr Hip Unilat W Or W/o Pelvis 2-3 Views Left  Result Date: 11/16/2016 AP pelvis and left hip lateral view: No acute fracture. Bilateral hips well located. Status post left total hip arthroplasty without any complicating features    PMFS History: Patient Active Problem List   Diagnosis Date Noted  . Pain in left hip 11/16/2016  . Hypocalcemia 08/12/2015  . Thoracic aortic aneurysm ([place]) 08/05/2015  . Osteoarthritis of left hip 05/10/2015  . Status post total replacement of left hip 05/10/2015  . Thoracic aortic aneurysm, without rupture ([place]) 05/01/2015  . Incidental pulmonary nodule, > 56mm and < 57mm left lower lobe 05/01/2015  . H/O bicuspid aortic valve 05/01/2015  . HLD (hyperlipidemia) 01/03/2015  . 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 BP (high blood pressure) 12/19/2014  . Long term current use of anticoagulant 12/03/2014  . H/O aortic valve replacement 12/03/2014  . Degenerative arthritis of hip 03/17/2011   Past Medical History:  Diagnosis Date  . Aortic stenosis   . BP (high blood pressure) 12/19/2014   takes Lisinopril and Metoprolol daily  . Cancer ([place])    thyroid  . Complication of  anesthesia   . Degenerative arthritis of hip 03/17/2011  . Environmental allergies    Albuterol inhaler as needed  . GERD (gastroesophageal reflux disease)    takes Zantac daily  . H/O aortic valve replacement 12/03/2014  . H/O bicuspid aortic valve 05/01/2015  . History of bronchitis   . HLD (hyperlipidemia)    was on Crestor but has been off for months(oct 2016)  . Hypothyroidism    takes Synthroid daily  . Incidental pulmonary nodule, > 4mm and < 85mm left lower lobe 05/01/2015  . Long term current use of anticoagulant 12/03/2014  . 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 PONV (postoperative nausea and vomiting)     nausea  . S/P thyroidectomy   . Thoracic aortic aneurysm, without rupture ([place]) 05/01/2015  . TIA (transient ischemic attack)   . Vertigo    d//t inner ear    Family History  Problem Relation Age of Onset  . Heart disease Mother        before age 70  . Heart disease Father        before age 30    Past Surgical History:  Procedure Laterality Date  . BENTALL PROCEDURE N/A 08/05/2015   Procedure: REDO BENTALL PROCEDURE with circ arrest using a 32 Hemashield platinum graft. ;  Surgeon: [name], MD;  Location: [facility];  Service: Open Heart Surgery;  Laterality: N/A;  POSSIBLE CIRC ARREST  . CARDIAC VALVE SURGERY  at age 47/at age 68   AVReplacement 2003. Dr Berline L[facility]  . TEE WITHOUT CARDIOVERSION N/A 08/05/2015   Procedure: TRANSESOPHAGEAL ECHOCARDIOGRAM (TEE);  Surgeon: [name], MD;  Location: [facility];  Service: Open Heart Surgery;  Laterality: N/A;  . THYROIDECTOMY     12/24/2014, Dr Meredith [facility]  . TOTAL HIP ARTHROPLASTY Left 05/10/2015   Procedure: LEFT TOTAL HIP ARTHROPLASTY ANTERIOR APPROACH;  Surgeon: Mcarthur Rossetti, MD;  Location: [facility];  Service: Orthopedics;  Laterality: Left;   Social History   Occupational History  . Not on file.   Social History Main Topics  . Smoking status: Never Smoker  . Smokeless tobacco: Never Used  . Alcohol use No  . Drug use: No  . Sexual activity: Yes

## 2017-12-18 ENCOUNTER — Ambulatory Visit (INDEPENDENT_AMBULATORY_CARE_PROVIDER_SITE_OTHER): Payer: BLUE CROSS/BLUE SHIELD

## 2017-12-18 ENCOUNTER — Ambulatory Visit: Payer: 59 | Admitting: Podiatry

## 2017-12-18 ENCOUNTER — Other Ambulatory Visit: Payer: Self-pay | Admitting: Podiatry

## 2017-12-18 DIAGNOSIS — M722 Plantar fascial fibromatosis: Secondary | ICD-10-CM | POA: Diagnosis not present

## 2017-12-18 DIAGNOSIS — M778 Other enthesopathies, not elsewhere classified: Secondary | ICD-10-CM

## 2017-12-18 DIAGNOSIS — M779 Enthesopathy, unspecified: Principal | ICD-10-CM

## 2017-12-18 MED ORDER — METHYLPREDNISOLONE 4 MG PO TBPK: ORAL_TABLET | ORAL | 0 refills | Status: DC

## 2017-12-21 NOTE — Progress Notes (Signed)
   Subjective: 47 year old male presenting today as a new patient with a chief complaint of left foot pain that began three months ago when he started his new job. He states the pain is worse when he first gets out of bed. Walking and standing for long periods of time also increases the pain. He has not done anything for treatment. Patient is here for further evaluation and treatment.   Past Medical History:  Diagnosis Date  . Aortic stenosis   . BP (high blood pressure) 12/19/2014   takes Lisinopril and Metoprolol daily  . Cancer ([place])    thyroid  . Complication of anesthesia   . Degenerative arthritis of hip 03/17/2011  . Environmental allergies    Albuterol inhaler as needed  . GERD (gastroesophageal reflux disease)    takes Zantac daily  . H/O aortic valve replacement 12/03/2014  . H/O bicuspid aortic valve 05/01/2015  . History of bronchitis   . HLD (hyperlipidemia)    was on Crestor but has been off for months(oct 2016)  . Hypothyroidism    takes Synthroid daily  . Incidental pulmonary nodule, > 39mm and < 51mm left lower lobe 05/01/2015  . Long term current use of anticoagulant 12/03/2014  . 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 PONV (postoperative nausea and vomiting)    nausea  . S/P thyroidectomy   . Thoracic aortic aneurysm, without rupture ([place]) 05/01/2015  . TIA (transient ischemic attack)   . Vertigo    d//t inner ear     Objective: Physical Exam General: The patient is alert and oriented x3 in no acute distress.  Dermatology: Skin is warm, dry and supple bilateral lower extremities. Negative for open lesions or macerations bilateral.   Vascular: Dorsalis Pedis and Posterior Tibial pulses palpable bilateral.  Capillary fill time is immediate to all digits.  Neurological: Epicritic and protective threshold intact bilateral.   Musculoskeletal: Tenderness to palpation to the plantar aspect of the left heel along the plantar fascia. All other joints  range of motion within normal limits bilateral. Strength 5/5 in all groups bilateral.   Radiographic exam:   Normal osseous mineralization. Joint spaces preserved. No fracture/dislocation/boney destruction. No other soft tissue abnormalities or radiopaque foreign bodies.   Assessment: 1. Plantar fasciitis left foot  Plan of Care:  1. Patient evaluated. Xrays reviewed.   2. Injection of 0.5cc Celestone soluspan injected into the left plantar fascia.  3. Rx for Medrol Dose Pak placed 4. Plantar fascial band(s) dispensed  5. Prescription for pain cream to be dispensed by [facility].  6. Instructed patient regarding therapies and modalities at home to alleviate symptoms.  7. Recommended OTC insoles.  8. Return to clinic in 4 weeks.     Edrick Kins, DPM [facility]  Dr. Edrick Kins, DPM    [address], [place] [address]                Office [phone]  Fax [phone]

## 2018-01-31 ENCOUNTER — Ambulatory Visit: Payer: BLUE CROSS/BLUE SHIELD | Admitting: Podiatry

## 2018-02-07 ENCOUNTER — Ambulatory Visit (INDEPENDENT_AMBULATORY_CARE_PROVIDER_SITE_OTHER): Payer: BLUE CROSS/BLUE SHIELD | Admitting: Orthopaedic Surgery

## 2018-02-09 ENCOUNTER — Ambulatory Visit (INDEPENDENT_AMBULATORY_CARE_PROVIDER_SITE_OTHER): Payer: BLUE CROSS/BLUE SHIELD | Admitting: Orthopaedic Surgery

## 2018-02-09 ENCOUNTER — Ambulatory Visit (INDEPENDENT_AMBULATORY_CARE_PROVIDER_SITE_OTHER): Payer: Self-pay

## 2018-02-09 ENCOUNTER — Encounter (INDEPENDENT_AMBULATORY_CARE_PROVIDER_SITE_OTHER): Payer: Self-pay | Admitting: Orthopaedic Surgery

## 2018-02-09 DIAGNOSIS — M1611 Unilateral primary osteoarthritis, right hip: Secondary | ICD-10-CM | POA: Insufficient documentation

## 2018-02-09 DIAGNOSIS — M25551 Pain in right hip: Secondary | ICD-10-CM | POA: Diagnosis not present

## 2018-02-09 DIAGNOSIS — M25552 Pain in left hip: Secondary | ICD-10-CM

## 2018-02-09 DIAGNOSIS — Z96642 Presence of left artificial hip joint: Secondary | ICD-10-CM | POA: Diagnosis not present

## 2018-02-09 NOTE — Progress Notes (Signed)
Office Visit Note   Patient: [name] [name]           Date of Birth: [birth year]           MRN: [id number] Visit Date: 02/09/2018              Requested by: Reita Cliche, MD No address on file PCP: Reita Cliche, MD   Assessment & Plan: Visit Diagnoses:  1. Pain in left hip   2. Right hip pain   3. Status post total replacement of left hip   4. Unilateral primary osteoarthritis, right hip     Plan: I do feel that his arthritis is gotten quite severe in his right hip as does he.  I do also believe that this is causing the pain in his other hip his knees and contributing to plantar fasciitis because his Trendelenburg gait is throwing him off when he walks.  I think this is affecting his overall balance and coordination.  At this point he is interested in proceeding with a right total hip arthroplasty given the severity of his pain and the detrimental effect this is having on his activities of daily living, his mobility, and his quality of life.  He has had success with his left hip replacement and understands fully the risks and benefits of the surgery having had it done before.  We had a long and thorough discussion again about his intraoperative and postoperative course.  He is someone that is on Coumadin due to heart valve surgery.  As before, he would have to stop Coumadin 6 days prior to surgery and be bridged with Lovenox.  Even with this regimen, he had spinal anesthesia successfully at his last surgery.  He wishes for that again.  We are both considering surgery for him sometime the last week in November.  If in the next week or 2 he would like to have an intra-articular steroid injection in his right hip, I do feel that would be appropriate however if he waits longer he understands we would hold off on any type of injection.  All question concerns were answered and addressed.  Follow-Up Instructions: Return for 2 weeks post-op.   Orders:  Orders Placed This Encounter    Procedures  . XR HIPS BILAT W OR W/O PELVIS 2V   No orders of the defined types were placed in this encounter.     Procedures: No procedures performed   Clinical Data: No additional findings.   Subjective: Chief Complaint  Patient presents with  . Left Hip - Pain  . Right Hip - Pain  The patient is well-known to me.  Although he is only 47 years old he has a history of a left total hip arthroplasty that we did in 2016.  He also has known severe osteoarthritis of his right hip.  This is been well documented from previous x-rays in the past.  His hip pain now is become significantly worse.  It is affecting his gait enough that it is causing him to have pain in his left operative hip as well as occasionally in his knees and he has now chronic plantar fasciitis of his left foot.  His pain is in the groin on the right side.  This point it is detrimentally affecting his mobility and his quality of life.  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  He is a very active individual.  He is interested  at this point considering a right total hip arthroplasty.  We have recommended this in the past as well.  HPI  Review of Systems He currently denies any headache, chest pain, shortness of breath, fever, chills, nausea, vomiting.  Objective: Vital Signs: There were no vitals taken for this visit.  Physical Exam  Ortho Exam Examination of his left hip shows full range of motion with no pain and no problems at all with compressing the hip or stressing the hip.  Examination of his right hip is significant limited by his pain as well as limitations with internal and external rotation due to the severity of his arthritis. Specialty Comments:  No specialty comments available.  Imaging: Xr Hips Bilat W Or W/o Pelvis 2v  Result Date: 02/09/2018 An AP pelvis and lateral both hips are obtained.  There is a stable total hip arthroplasty in the left hip with no evidence  of loosening or complicating features.  The right hip has severe end-stage arthritis that is worse in when comparing to films from 2018.  There is complete loss of superior lateral joint space.  There is cystic changes in the femoral head and acetabulum and large para-articular osteophytes.    PMFS History: Patient Active Problem List   Diagnosis Date Noted  . Unilateral primary osteoarthritis, right hip 02/09/2018  . Pain in left hip 11/16/2016  . Vitamin D deficiency 09/10/2015  . Hypocalcemia 08/12/2015  . Thoracic aortic aneurysm ([place]) 08/05/2015  . 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 Osteoarthritis of left hip 05/10/2015  . Status post total replacement of left hip 05/10/2015  . Thoracic aortic aneurysm, without rupture ([place]) 05/01/2015  . Incidental pulmonary nodule, > 18mm and < 6mm left lower lobe 05/01/2015  . H/O bicuspid aortic valve 05/01/2015  . Encounter for therapeutic drug level monitoring 04/29/2015  . HLD (hyperlipidemia) 01/03/2015  . 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 Malignant neoplasm of thyroid gland ([place]) 12/25/2014  . BP (high blood pressure) 12/19/2014  . Thyroid mass 12/19/2014  . Dysphagia 12/19/2014  . Long term current use of anticoagulant 12/03/2014  . H/O aortic valve replacement 12/03/2014  . Presence of prosthetic heart valve 12/03/2014  . Aortic valve stenosis 12/03/2014  . Anticoagulated on warfarin 12/03/2014  . Degenerative arthritis of hip 03/17/2011   Past Medical History:  Diagnosis Date  . Aortic stenosis   . BP (high blood pressure) 12/19/2014   takes Lisinopril and Metoprolol daily  . Cancer ([place])    thyroid  . Complication of anesthesia   . Degenerative arthritis of hip 03/17/2011  . Environmental allergies    Albuterol inhaler as needed  . GERD (gastroesophageal reflux disease)    takes Zantac daily  . H/O aortic valve replacement 12/03/2014  . H/O bicuspid aortic valve 05/01/2015  . History of bronchitis    . HLD (hyperlipidemia)    was on Crestor but has been off for months(oct 2016)  . Hypothyroidism    takes Synthroid daily  . Incidental pulmonary nodule, > 109mm and < 32mm left lower lobe 05/01/2015  . Long term current use of anticoagulant 12/03/2014  . 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 PONV (postoperative nausea and vomiting)    nausea  . S/P thyroidectomy   . Thoracic aortic aneurysm, without rupture ([place]) 05/01/2015  . TIA (transient ischemic attack)   . Vertigo    d//t inner ear    Family History  Problem Relation Age of Onset  . Heart disease Mother  before age 66  . Heart disease Father        before age 78    Past Surgical History:  Procedure Laterality Date  . BENTALL PROCEDURE N/A 08/05/2015   Procedure: REDO BENTALL PROCEDURE with circ arrest using a 32 Hemashield platinum graft. ;  Surgeon: [name], MD;  Location: [facility];  Service: Open Heart Surgery;  Laterality: N/A;  POSSIBLE CIRC ARREST  . CARDIAC VALVE SURGERY  at age 37/at age 30   AV[facility]placement 2003. Dr [facility]  . TEE WITHOUT CARDIOVERSION N/A 08/05/2015   Procedure: TRANSESOPHAGEAL ECHOCARDIOGRAM (TEE);  Surgeon: [name], MD;  Location: [facility];  Service: Open Heart Surgery;  Laterality: N/A;  . THYROIDECTOMY     12/24/2014, Dr [facility]eredith [facility]  . TOTAL HIP ARTHROPLASTY Left 05/10/2015   Procedure: LEFT TOTAL HIP ARTHROPLASTY ANTERIOR APPROACH;  Surgeon: Mcarthur Rossetti, MD;  Location: [facility];  Service: Orthopedics;  Laterality: Left;   Social History   Occupational History  . Not on file  Tobacco Use  . Smoking status: Never Smoker  . Smokeless tobacco: Never Used  Substance and Sexual Activity  . Alcohol use: No    Alcohol/week: 0.0 standard drinks  . Drug use: No  . Sexual activity: Yes

## 2018-03-31 ENCOUNTER — Ambulatory Visit (INDEPENDENT_AMBULATORY_CARE_PROVIDER_SITE_OTHER): Payer: BLUE CROSS/BLUE SHIELD | Admitting: Physician Assistant

## 2018-03-31 ENCOUNTER — Encounter (INDEPENDENT_AMBULATORY_CARE_PROVIDER_SITE_OTHER): Payer: Self-pay | Admitting: Physician Assistant

## 2018-03-31 DIAGNOSIS — M1611 Unilateral primary osteoarthritis, right hip: Secondary | ICD-10-CM | POA: Diagnosis not present

## 2018-03-31 NOTE — Progress Notes (Signed)
Office Visit Note   Patient: [name] [name]           Date of Birth: Sep 10, 1970           MRN: [id number] Visit Date: 03/31/2018              Requested by: Reita Cliche, MD [address] [place], [place] [address] PCP: Reita Cliche, MD   Assessment & Plan: Visit Diagnoses:  1. Unilateral primary osteoarthritis, right hip     Plan: Due to the fact the patient continues to have severe pain this affecting his daily activities and also his ability to ride a horse recommend right total hip arthroplasty.  Again he has severe right hip osteoarthritis.  Is now also causing to have more pain in the left hip is most likely due to his Trendelenburg gait.  He is done well with his left hip and understands the risks and benefits of surgery.  He would like to proceed with a right total hip near future.  He will see Dr. Servando Snare due to his significant cardiac history prior surgery to gain preoperative clearance.  Also for recommendations on bridging him from his Coumadin to Lovenox for surgery.  He will follow-up 2 weeks postop.  Follow-Up Instructions: No follow-ups on file.   Orders:  No orders of the defined types were placed in this encounter.  No orders of the defined types were placed in this encounter.     Procedures: No procedures performed   Clinical Data: No additional findings.   Subjective: Chief Complaint  Patient presents with  . Right Hip - Pain    HPI Mr. Ohair returns today due to increasing right hip pain.  He like to schedule right total hip arthroplasty in the near future.  He states is affecting his activities of daily living.  Is also causing him to have increased pain in the left hip his gait.  He notes that he is unable to get on and off worse due to the pain in the right hip.  Said no new injury.  He denies any chest pain shortness breath fevers or chills.  He is on Coumadin with a history of aortic valve replacement, ascending aortic graft and  history of TIA on chronic Coumadin. Review of Systems See HPI otherwise negative  Objective: Vital Signs: There were no vitals taken for this visit.  Physical Exam  Constitutional: He is oriented to person, place, and time. He appears well-developed and well-nourished. No distress.  Pulmonary/Chest: Effort normal.  Neurological: He is alert and oriented to person, place, and time.  Skin: He is not diaphoretic.  Psychiatric: He has a normal mood and affect.    Ortho Exam Left hip good range of motion without pain.  Right hip limited internal and external rotation with significant pain. Specialty Comments:  No specialty comments available.  Imaging: No results found.   PMFS History: Patient Active Problem List   Diagnosis Date Noted  . Unilateral primary osteoarthritis, right hip 02/09/2018  . Pain in left hip 11/16/2016  . Vitamin D deficiency 09/10/2015  . Hypocalcemia 08/12/2015  . Thoracic aortic aneurysm ([place]) 08/05/2015  . 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 Osteoarthritis of left hip 05/10/2015  . Status post total replacement of left hip 05/10/2015  . Thoracic aortic aneurysm, without rupture ([place]) 05/01/2015  . Incidental pulmonary nodule, > 40mm and < 63mm left lower lobe 05/01/2015  . H/O bicuspid aortic valve 05/01/2015  . Encounter for therapeutic drug  level monitoring 04/29/2015  . HLD (hyperlipidemia) 01/03/2015  . 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 Malignant neoplasm of thyroid gland ([place]) 12/25/2014  . BP (high blood pressure) 12/19/2014  . Thyroid mass 12/19/2014  . Dysphagia 12/19/2014  . Long term current use of anticoagulant 12/03/2014  . H/O aortic valve replacement 12/03/2014  . Presence of prosthetic heart valve 12/03/2014  . Aortic valve stenosis 12/03/2014  . Anticoagulated on warfarin 12/03/2014  . Degenerative arthritis of hip 03/17/2011   Past Medical History:  Diagnosis Date  . Aortic stenosis   . BP (high blood  pressure) 12/19/2014   takes Lisinopril and Metoprolol daily  . Cancer ([place])    thyroid  . Complication of anesthesia   . Degenerative arthritis of hip 03/17/2011  . Environmental allergies    Albuterol inhaler as needed  . GERD (gastroesophageal reflux disease)    takes Zantac daily  . H/O aortic valve replacement 12/03/2014  . H/O bicuspid aortic valve 05/01/2015  . History of bronchitis   . HLD (hyperlipidemia)    was on Crestor but has been off for months(oct 2016)  . Hypothyroidism    takes Synthroid daily  . Incidental pulmonary nodule, > 79mm and < 51mm left lower lobe 05/01/2015  . Long term current use of anticoagulant 12/03/2014  . 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 PONV (postoperative nausea and vomiting)    nausea  . S/P thyroidectomy   . Thoracic aortic aneurysm, without rupture ([place]) 05/01/2015  . TIA (transient ischemic attack)   . Vertigo    d//t inner ear    Family History  Problem Relation Age of Onset  . Heart disease Mother        before age 70  . Heart disease Father        before age 91    Past Surgical History:  Procedure Laterality Date  . BENTALL PROCEDURE N/A 08/05/2015   Procedure: REDO BENTALL PROCEDURE with circ arrest using a 32 Hemashield platinum graft. ;  Surgeon: [name], MD;  Location: [facility];  Service: Open Heart Surgery;  Laterality: N/A;  POSSIBLE CIRC ARREST  . CARDIAC VALVE SURGERY  at age 65/at age 38   AVReplacement 2003. Dr Berline L[facility] in [facility]  . TEE WITHOUT CARDIOVERSION N/A 08/05/2015   Procedure: TRANSESOPHAGEAL ECHOCARDIOGRAM (TEE);  Surgeon: [name], MD;  Location: [facility];  Service: Open Heart Surgery;  Laterality: N/A;  . THYROIDECTOMY     12/24/2014, Dr Meredith [facility]  . TOTAL HIP ARTHROPLASTY Left 05/10/2015   Procedure: LEFT TOTAL HIP ARTHROPLASTY ANTERIOR APPROACH;  Surgeon: Mcarthur Rossetti, MD;  [facility];  Service: Orthopedics;  Laterality: Left;   Social History    Occupational History  . Not on file  Tobacco Use  . Smoking status: Never Smoker  . Smokeless tobacco: Never Used  Substance and Sexual Activity  . Alcohol use: No    Alcohol/week: 0.0 standard drinks  . Drug use: No  . Sexual activity: Yes

## 2018-04-04 ENCOUNTER — Telehealth: Payer: Self-pay

## 2018-04-04 NOTE — Telephone Encounter (Signed)
Patient contacted the office to set up an appointment for Cardiac Clearance for an upcoming orthopedic surgery.  Patient advised to contact his Cardiology to get written clearance and further instructions on a lovanox bridge.  Patient acknowledged receipt.

## 2018-04-26 ENCOUNTER — Encounter (HOSPITAL_COMMUNITY): Payer: Self-pay | Admitting: *Deleted

## 2018-04-29 NOTE — Patient Instructions (Addendum)
[name] [name]  04/29/2018   Your procedure is scheduled on: 05-13-18   Report to [facility] Main  Entrance    Report to Admitting at 7:15 AM    Call this number if you have problems the morning of surgery [phone]    Remember: Do not eat food or drink liquids :After Midnight.    BRUSH YOUR TEETH MORNING OF SURGERY AND RINSE YOUR MOUTH OUT, NO CHEWING GUM CANDY OR MINTS.     Take these medicines the morning of surgery with A SIP OF WATER: Cetirizine,and Levothyroxine                                You may not have any metal on your body including hair pins and              piercings  Do not wear jewelry, cologne, lotions, powders or deodorant             Men may shave face and neck.   Do not bring valuables to the hospital. [place].  Contacts, dentures or bridgework may not be worn into surgery.  Leave suitcase in the car. After surgery it may be brought to your room.      Special Instructions: N/A              Please read over the following fact sheets you were given: _____________________________________________________________________             [facility] - Preparing for Surgery Before surgery, you can play an important role.  Because skin is not sterile, your skin needs to be as free of germs as possible.  You can reduce the number of germs on your skin by washing with CHG (chlorahexidine gluconate) soap before surgery.  CHG is an antiseptic cleaner which kills germs and bonds with the skin to continue killing germs even after washing. Please DO NOT use if you have an allergy to CHG or antibacterial soaps.  If your skin becomes reddened/irritated stop using the CHG and inform your nurse when you arrive at Short Stay. Do not shave (including legs and underarms) for at least 48 hours prior to the first CHG shower.  You may shave your face/neck. Please follow these instructions carefully:  1.   Shower with CHG Soap the night before surgery and the  morning of Surgery.  2.  If you choose to wash your hair, wash your hair first as usual with your  normal  shampoo.  3.  After you shampoo, rinse your hair and body thoroughly to remove the  shampoo.                           4.  Use CHG as you would any other liquid soap.  You can apply chg directly  to the skin and wash                       Gently with a scrungie or clean washcloth.  5.  Apply the CHG Soap to your body ONLY FROM THE NECK DOWN.   Do not use on face/ open  Wound or open sores. Avoid contact with eyes, ears mouth and genitals (private parts).                       Wash face,  Genitals (private parts) with your normal soap.             6.  Wash thoroughly, paying special attention to the area where your surgery  will be performed.  7.  Thoroughly rinse your body with warm water from the neck down.  8.  DO NOT shower/wash with your normal soap after using and rinsing off  the CHG Soap.                9.  Pat yourself dry with a clean towel.            10.  Wear clean pajamas.            11.  Place clean sheets on your bed the night of your first shower and do not  sleep with pets. Day of Surgery : Do not apply any lotions/deodorants the morning of surgery.  Please wear clean clothes to the hospital/surgery center.  FAILURE TO FOLLOW THESE INSTRUCTIONS MAY RESULT IN THE CANCELLATION OF YOUR SURGERY PATIENT SIGNATURE_________________________________  NURSE SIGNATURE__________________________________  ________________________________________________________________________

## 2018-04-29 NOTE — Progress Notes (Signed)
06-22-16 ( Epic) LOV w/Cardiothoracic with instructions to follow-up in 2 years.

## 2018-05-02 ENCOUNTER — Encounter (HOSPITAL_COMMUNITY): Payer: Self-pay

## 2018-05-02 ENCOUNTER — Other Ambulatory Visit: Payer: Self-pay

## 2018-05-02 ENCOUNTER — Encounter (HOSPITAL_COMMUNITY)
Admission: RE | Admit: 2018-05-02 | Discharge: 2018-05-02 | Disposition: A | Discharge status: Home / Self Care | Payer: BLUE CROSS/BLUE SHIELD | Source: Ambulatory Visit | Attending: Orthopaedic Surgery | Admitting: Orthopaedic Surgery

## 2018-05-02 DIAGNOSIS — M1611 Unilateral primary osteoarthritis, right hip: Secondary | ICD-10-CM | POA: Diagnosis not present

## 2018-05-02 DIAGNOSIS — Z01818 Encounter for other preprocedural examination: Secondary | ICD-10-CM | POA: Diagnosis present

## 2018-05-02 LAB — BASIC METABOLIC PANEL
Anion gap: 9 (ref 5–15)
BUN: 18 mg/dL (ref 6–20)
CO2: 29 mmol/L (ref 22–32)
Calcium: 8.6 mg/dL — ABNORMAL LOW (ref 8.9–10.3)
Chloride: 103 mmol/L (ref 98–111)
Creatinine, Ser: 0.88 mg/dL (ref 0.61–1.24)
GFR calc Af Amer: >60 mL/min (ref >60)
GFR calc non Af Amer: >60 mL/min (ref >60)
GLUCOSE: 100 mg/dL — AB (ref 70–99)
Potassium: 4.3 mmol/L (ref 3.5–5.1)
Sodium: 141 mmol/L (ref 135–145)

## 2018-05-02 LAB — CBC
HCT: 47 % (ref 39.0–52.0)
Hemoglobin: 14.9 g/dL (ref 13.0–17.0)
MCH: 28.6 pg (ref 26.0–34.0)
MCHC: 31.7 g/dL (ref 30.0–36.0)
MCV: 90.2 fL (ref 80.0–100.0)
Platelets: 349 10*3/uL (ref 150–400)
RBC: 5.21 MIL/uL (ref 4.22–5.81)
RDW: 13.3 % (ref 11.5–15.5)
WBC: 5.8 10*3/uL (ref 4.0–10.5)
nRBC: 0 % (ref 0.0–0.2)

## 2018-05-02 LAB — SURGICAL PCR SCREEN
MRSA, PCR: NEGATIVE
Staphylococcus aureus: POSITIVE — AB

## 2018-05-02 NOTE — Progress Notes (Addendum)
04-25-18 (Epic) Cardiac Clearance from Dr. Atilano Median and EKG in chart everywhere.  05-02-18 PCR results routed to Dr. Ninfa Linden for review.

## 2018-05-06 ENCOUNTER — Telehealth (INDEPENDENT_AMBULATORY_CARE_PROVIDER_SITE_OTHER): Payer: Self-pay | Admitting: Orthopaedic Surgery

## 2018-05-06 MED ORDER — METHOCARBAMOL 500 MG PO TABS: 500.0000 mg | ORAL_TABLET | Freq: Four times a day (QID) | ORAL | 0 refills | Status: DC | PRN

## 2018-05-06 NOTE — Telephone Encounter (Signed)
I called patient and advised. 

## 2018-05-06 NOTE — Telephone Encounter (Signed)
Please advise 

## 2018-05-06 NOTE — Telephone Encounter (Signed)
I sent in some robaxin.

## 2018-05-06 NOTE — Telephone Encounter (Signed)
Patient is scheduled for right total hip with Dr. Ninfa Linden on 05-13-2018.  Is requesting a muscle relaxer to provide him with relief until surgery.  Pt cb  [place] Dr. Arlean Hopping

## 2018-05-09 ENCOUNTER — Other Ambulatory Visit (INDEPENDENT_AMBULATORY_CARE_PROVIDER_SITE_OTHER): Payer: Self-pay | Admitting: Physician Assistant

## 2018-05-13 ENCOUNTER — Inpatient Hospital Stay (HOSPITAL_COMMUNITY): Payer: BLUE CROSS/BLUE SHIELD

## 2018-05-13 ENCOUNTER — Inpatient Hospital Stay (HOSPITAL_COMMUNITY)
Admission: RE | Admit: 2018-05-13 | Discharge: 2018-05-15 | DRG: 470 | Disposition: A | Discharge status: Home / Self Care | Payer: BLUE CROSS/BLUE SHIELD | Attending: Orthopaedic Surgery | Admitting: Orthopaedic Surgery

## 2018-05-13 ENCOUNTER — Encounter (HOSPITAL_COMMUNITY)
Admission: RE | Disposition: A | Discharge status: Home / Self Care | Payer: Self-pay | Source: Home / Self Care | Attending: Orthopaedic Surgery

## 2018-05-13 ENCOUNTER — Inpatient Hospital Stay (HOSPITAL_COMMUNITY): Payer: BLUE CROSS/BLUE SHIELD | Admitting: Certified Registered Nurse Anesthetist

## 2018-05-13 ENCOUNTER — Encounter (HOSPITAL_COMMUNITY): Payer: Self-pay | Admitting: *Deleted

## 2018-05-13 ENCOUNTER — Other Ambulatory Visit: Payer: Self-pay

## 2018-05-13 ENCOUNTER — Other Ambulatory Visit: Payer: Self-pay | Admitting: *Deleted

## 2018-05-13 DIAGNOSIS — M1611 Unilateral primary osteoarthritis, right hip: Secondary | ICD-10-CM

## 2018-05-13 DIAGNOSIS — Z96641 Presence of right artificial hip joint: Secondary | ICD-10-CM

## 2018-05-13 DIAGNOSIS — I712 Thoracic aortic aneurysm, without rupture, unspecified: Secondary | ICD-10-CM

## 2018-05-13 DIAGNOSIS — Z79899 Other long term (current) drug therapy: Secondary | ICD-10-CM

## 2018-05-13 DIAGNOSIS — Z79890 Hormone replacement therapy (postmenopausal): Secondary | ICD-10-CM | POA: Diagnosis not present

## 2018-05-13 DIAGNOSIS — K219 Gastro-esophageal reflux disease without esophagitis: Secondary | ICD-10-CM | POA: Diagnosis present

## 2018-05-13 DIAGNOSIS — Z8673 Personal history of transient ischemic attack (TIA), and cerebral infarction without residual deficits: Secondary | ICD-10-CM

## 2018-05-13 DIAGNOSIS — Z85850 Personal history of malignant neoplasm of thyroid: Secondary | ICD-10-CM

## 2018-05-13 DIAGNOSIS — E890 Postprocedural hypothyroidism: Secondary | ICD-10-CM | POA: Diagnosis present

## 2018-05-13 DIAGNOSIS — Z952 Presence of prosthetic heart valve: Secondary | ICD-10-CM

## 2018-05-13 DIAGNOSIS — Z419 Encounter for procedure for purposes other than remedying health state, unspecified: Secondary | ICD-10-CM

## 2018-05-13 DIAGNOSIS — Z96642 Presence of left artificial hip joint: Secondary | ICD-10-CM | POA: Diagnosis present

## 2018-05-13 HISTORY — PX: TOTAL HIP ARTHROPLASTY: SHX124

## 2018-05-13 LAB — CREATININE, SERUM
Creatinine, Ser: 1.06 mg/dL (ref 0.61–1.24)
GFR calc Af Amer: >60 mL/min (ref >60)
GFR calc non Af Amer: >60 mL/min (ref >60)

## 2018-05-13 LAB — CBC
HCT: 43.6 % (ref 39.0–52.0)
Hemoglobin: 14.2 g/dL (ref 13.0–17.0)
MCH: 29 pg (ref 26.0–34.0)
MCHC: 32.6 g/dL (ref 30.0–36.0)
MCV: 89.2 fL (ref 80.0–100.0)
PLATELETS: 241 10*3/uL (ref 150–400)
RBC: 4.89 MIL/uL (ref 4.22–5.81)
RDW: 13.1 % (ref 11.5–15.5)
WBC: 15 10*3/uL — ABNORMAL HIGH (ref 4.0–10.5)
nRBC: 0 % (ref 0.0–0.2)

## 2018-05-13 SURGERY — ARTHROPLASTY, HIP, TOTAL, ANTERIOR APPROACH
Anesthesia: General | Site: Hip | Laterality: Right

## 2018-05-13 MED ORDER — WARFARIN SODIUM 5 MG PO TABS
12.5000 mg | ORAL_TABLET | Freq: Once | ORAL | Status: AC
  Administered 2018-05-13: 12.5 mg via ORAL
  Filled 2018-05-13: qty 2

## 2018-05-13 MED ORDER — WARFARIN - PHARMACIST DOSING INPATIENT: Freq: Every day | Status: DC

## 2018-05-13 MED ORDER — HYDROMORPHONE HCL 1 MG/ML IJ SOLN: 0.5000 mg | INTRAMUSCULAR | Status: DC | PRN

## 2018-05-13 MED ORDER — ONDANSETRON HCL 4 MG/2ML IJ SOLN: 4.0000 mg | Freq: Once | INTRAMUSCULAR | Status: DC | PRN

## 2018-05-13 MED ORDER — LEVOTHYROXINE SODIUM 112 MCG PO TABS
224.0000 ug | ORAL_TABLET | Freq: Every day | ORAL | Status: DC
  Administered 2018-05-14 – 2018-05-15 (×2): 224 ug via ORAL
  Filled 2018-05-13 (×2): qty 2

## 2018-05-13 MED ORDER — EZETIMIBE 10 MG PO TABS
10.0000 mg | ORAL_TABLET | Freq: Every day | ORAL | Status: DC
  Administered 2018-05-13 – 2018-05-14 (×2): 10 mg via ORAL
  Filled 2018-05-13 (×2): qty 1

## 2018-05-13 MED ORDER — DEXAMETHASONE SODIUM PHOSPHATE 10 MG/ML IJ SOLN
INTRAMUSCULAR | Status: AC
  Filled 2018-05-13: qty 1

## 2018-05-13 MED ORDER — CEFAZOLIN SODIUM-DEXTROSE 1-4 GM/50ML-% IV SOLN
1.0000 g | Freq: Four times a day (QID) | INTRAVENOUS | Status: AC
  Administered 2018-05-13 (×2): 1 g via INTRAVENOUS
  Filled 2018-05-13 (×2): qty 50

## 2018-05-13 MED ORDER — FENTANYL CITRATE (PF) 250 MCG/5ML IJ SOLN
INTRAMUSCULAR | Status: AC
  Filled 2018-05-13: qty 5

## 2018-05-13 MED ORDER — DOCUSATE SODIUM 100 MG PO CAPS
100.0000 mg | ORAL_CAPSULE | Freq: Two times a day (BID) | ORAL | Status: DC
  Administered 2018-05-13 – 2018-05-15 (×4): 100 mg via ORAL
  Filled 2018-05-13 (×4): qty 1

## 2018-05-13 MED ORDER — PROPOFOL 10 MG/ML IV BOLUS
INTRAVENOUS | Status: DC | PRN
  Administered 2018-05-13: 180 mg via INTRAVENOUS

## 2018-05-13 MED ORDER — ACETAMINOPHEN 325 MG PO TABS
325.0000 mg | ORAL_TABLET | Freq: Four times a day (QID) | ORAL | Status: DC | PRN
  Administered 2018-05-13 – 2018-05-15 (×2): 650 mg via ORAL
  Filled 2018-05-13 (×2): qty 2

## 2018-05-13 MED ORDER — FENTANYL CITRATE (PF) 100 MCG/2ML IJ SOLN
25.0000 ug | INTRAMUSCULAR | Status: DC | PRN
  Administered 2018-05-13 (×3): 50 ug via INTRAVENOUS

## 2018-05-13 MED ORDER — ONDANSETRON HCL 4 MG/2ML IJ SOLN
INTRAMUSCULAR | Status: DC | PRN
  Administered 2018-05-13: 4 mg via INTRAVENOUS

## 2018-05-13 MED ORDER — SODIUM CHLORIDE 0.9 % IR SOLN
Status: DC | PRN
  Administered 2018-05-13: 1000 mL

## 2018-05-13 MED ORDER — MIDAZOLAM HCL 2 MG/2ML IJ SOLN
INTRAMUSCULAR | Status: AC
  Filled 2018-05-13: qty 2

## 2018-05-13 MED ORDER — SUGAMMADEX SODIUM 200 MG/2ML IV SOLN
INTRAVENOUS | Status: DC | PRN
  Administered 2018-05-13: 200 mg via INTRAVENOUS

## 2018-05-13 MED ORDER — ROCURONIUM BROMIDE 10 MG/ML (PF) SYRINGE
PREFILLED_SYRINGE | INTRAVENOUS | Status: AC
  Filled 2018-05-13: qty 10

## 2018-05-13 MED ORDER — FENTANYL CITRATE (PF) 100 MCG/2ML IJ SOLN
INTRAMUSCULAR | Status: AC
  Filled 2018-05-13: qty 2

## 2018-05-13 MED ORDER — STERILE WATER FOR IRRIGATION IR SOLN
Status: DC | PRN
  Administered 2018-05-13: 2000 mL

## 2018-05-13 MED ORDER — DIPHENHYDRAMINE HCL 12.5 MG/5ML PO ELIX: 12.5000 mg | ORAL_SOLUTION | ORAL | Status: DC | PRN

## 2018-05-13 MED ORDER — POLYETHYLENE GLYCOL 3350 17 G PO PACK
17.0000 g | PACK | Freq: Every day | ORAL | Status: DC | PRN
  Administered 2018-05-14: 17 g via ORAL
  Filled 2018-05-13: qty 1

## 2018-05-13 MED ORDER — OXYCODONE HCL 5 MG PO TABS: 5.0000 mg | ORAL_TABLET | Freq: Once | ORAL | Status: DC | PRN

## 2018-05-13 MED ORDER — SUGAMMADEX SODIUM 200 MG/2ML IV SOLN
INTRAVENOUS | Status: AC
  Filled 2018-05-13: qty 2

## 2018-05-13 MED ORDER — OXYCODONE HCL 5 MG PO TABS
5.0000 mg | ORAL_TABLET | ORAL | Status: DC | PRN
  Administered 2018-05-13 – 2018-05-15 (×7): 10 mg via ORAL
  Filled 2018-05-13 (×2): qty 2
  Filled 2018-05-13: qty 1
  Filled 2018-05-13 (×6): qty 2

## 2018-05-13 MED ORDER — OXYCODONE HCL 5 MG PO TABS
10.0000 mg | ORAL_TABLET | ORAL | Status: DC | PRN
  Administered 2018-05-15 (×2): 10 mg via ORAL
  Filled 2018-05-13: qty 2

## 2018-05-13 MED ORDER — ROSUVASTATIN CALCIUM 10 MG PO TABS: 10.0000 mg | ORAL_TABLET | ORAL | Status: DC

## 2018-05-13 MED ORDER — PROPOFOL 10 MG/ML IV BOLUS
INTRAVENOUS | Status: AC
  Filled 2018-05-13: qty 60

## 2018-05-13 MED ORDER — SODIUM CHLORIDE 0.9 % IV SOLN
INTRAVENOUS | Status: DC
  Administered 2018-05-13: 75 mL/h via INTRAVENOUS
  Administered 2018-05-14: 03:00:00 via INTRAVENOUS

## 2018-05-13 MED ORDER — LACTATED RINGERS IV SOLN
INTRAVENOUS | Status: DC
  Administered 2018-05-13 (×2): via INTRAVENOUS

## 2018-05-13 MED ORDER — METHOCARBAMOL 500 MG IVPB - SIMPLE MED
500.0000 mg | Freq: Four times a day (QID) | INTRAVENOUS | Status: DC | PRN
  Administered 2018-05-13: 500 mg via INTRAVENOUS
  Filled 2018-05-13: qty 50

## 2018-05-13 MED ORDER — ONDANSETRON HCL 4 MG/2ML IJ SOLN: 4.0000 mg | Freq: Four times a day (QID) | INTRAMUSCULAR | Status: DC | PRN

## 2018-05-13 MED ORDER — DEXAMETHASONE SODIUM PHOSPHATE 10 MG/ML IJ SOLN
INTRAMUSCULAR | Status: DC | PRN
  Administered 2018-05-13: 10 mg via INTRAVENOUS

## 2018-05-13 MED ORDER — PHENYLEPHRINE 40 MCG/ML (10ML) SYRINGE FOR IV PUSH (FOR BLOOD PRESSURE SUPPORT)
PREFILLED_SYRINGE | INTRAVENOUS | Status: DC | PRN
  Administered 2018-05-13 (×3): 80 ug via INTRAVENOUS
  Administered 2018-05-13 (×2): 40 ug via INTRAVENOUS
  Administered 2018-05-13: 80 ug via INTRAVENOUS

## 2018-05-13 MED ORDER — PHENYLEPHRINE 40 MCG/ML (10ML) SYRINGE FOR IV PUSH (FOR BLOOD PRESSURE SUPPORT)
PREFILLED_SYRINGE | INTRAVENOUS | Status: AC
  Filled 2018-05-13: qty 10

## 2018-05-13 MED ORDER — FENTANYL CITRATE (PF) 100 MCG/2ML IJ SOLN
INTRAMUSCULAR | Status: DC | PRN
  Administered 2018-05-13: 100 ug via INTRAVENOUS
  Administered 2018-05-13 (×3): 50 ug via INTRAVENOUS

## 2018-05-13 MED ORDER — ALBUTEROL SULFATE HFA 108 (90 BASE) MCG/ACT IN AERS: 2.0000 | INHALATION_SPRAY | Freq: Four times a day (QID) | RESPIRATORY_TRACT | Status: DC | PRN

## 2018-05-13 MED ORDER — OXYCODONE HCL 5 MG/5ML PO SOLN
5.0000 mg | Freq: Once | ORAL | Status: DC | PRN
  Filled 2018-05-13: qty 5

## 2018-05-13 MED ORDER — TRANEXAMIC ACID-NACL 1000-0.7 MG/100ML-% IV SOLN
1000.0000 mg | INTRAVENOUS | Status: AC
  Administered 2018-05-13: 1000 mg via INTRAVENOUS
  Filled 2018-05-13: qty 100

## 2018-05-13 MED ORDER — ONDANSETRON HCL 4 MG PO TABS
4.0000 mg | ORAL_TABLET | Freq: Four times a day (QID) | ORAL | Status: DC | PRN
  Administered 2018-05-15: 4 mg via ORAL
  Filled 2018-05-13: qty 1

## 2018-05-13 MED ORDER — ONDANSETRON HCL 4 MG/2ML IJ SOLN
INTRAMUSCULAR | Status: AC
  Filled 2018-05-13: qty 2

## 2018-05-13 MED ORDER — MENTHOL 3 MG MT LOZG: 1.0000 | LOZENGE | OROMUCOSAL | Status: DC | PRN

## 2018-05-13 MED ORDER — METHOCARBAMOL 500 MG IVPB - SIMPLE MED
INTRAVENOUS | Status: AC
  Filled 2018-05-13: qty 50

## 2018-05-13 MED ORDER — LIDOCAINE 2% (20 MG/ML) 5 ML SYRINGE
INTRAMUSCULAR | Status: DC | PRN
  Administered 2018-05-13: 80 mg via INTRAVENOUS

## 2018-05-13 MED ORDER — LIDOCAINE 2% (20 MG/ML) 5 ML SYRINGE
INTRAMUSCULAR | Status: AC
  Filled 2018-05-13: qty 5

## 2018-05-13 MED ORDER — ASPIRIN EC 81 MG PO TBEC
81.0000 mg | DELAYED_RELEASE_TABLET | Freq: Every day | ORAL | Status: DC
  Administered 2018-05-13 – 2018-05-15 (×3): 81 mg via ORAL
  Filled 2018-05-13 (×3): qty 1

## 2018-05-13 MED ORDER — ENOXAPARIN SODIUM 40 MG/0.4ML ~~LOC~~ SOLN
40.0000 mg | SUBCUTANEOUS | Status: DC
  Administered 2018-05-14: 40 mg via SUBCUTANEOUS
  Filled 2018-05-13: qty 0.4

## 2018-05-13 MED ORDER — MIDAZOLAM HCL 5 MG/5ML IJ SOLN
INTRAMUSCULAR | Status: DC | PRN
  Administered 2018-05-13: 2 mg via INTRAVENOUS

## 2018-05-13 MED ORDER — ROCURONIUM BROMIDE 50 MG/5ML IV SOSY
PREFILLED_SYRINGE | INTRAVENOUS | Status: DC | PRN
Start: 2018-05-13 — End: 2018-05-13
  Administered 2018-05-13: 10 mg via INTRAVENOUS
  Administered 2018-05-13: 50 mg via INTRAVENOUS

## 2018-05-13 MED ORDER — METOCLOPRAMIDE HCL 5 MG PO TABS: 5.0000 mg | ORAL_TABLET | Freq: Three times a day (TID) | ORAL | Status: DC | PRN

## 2018-05-13 MED ORDER — ALUM & MAG HYDROXIDE-SIMETH 200-200-20 MG/5ML PO SUSP
30.0000 mL | ORAL | Status: DC | PRN
  Administered 2018-05-15: 30 mL via ORAL
  Filled 2018-05-13: qty 30

## 2018-05-13 MED ORDER — ZOLPIDEM TARTRATE 5 MG PO TABS: 5.0000 mg | ORAL_TABLET | Freq: Every evening | ORAL | Status: DC | PRN

## 2018-05-13 MED ORDER — PANTOPRAZOLE SODIUM 40 MG PO TBEC
40.0000 mg | DELAYED_RELEASE_TABLET | Freq: Every day | ORAL | Status: DC
  Administered 2018-05-13 – 2018-05-15 (×3): 40 mg via ORAL
  Filled 2018-05-13 (×3): qty 1

## 2018-05-13 MED ORDER — ALBUTEROL SULFATE (2.5 MG/3ML) 0.083% IN NEBU: 2.5000 mg | INHALATION_SOLUTION | Freq: Four times a day (QID) | RESPIRATORY_TRACT | Status: DC | PRN

## 2018-05-13 MED ORDER — CEFAZOLIN SODIUM-DEXTROSE 2-4 GM/100ML-% IV SOLN
2.0000 g | INTRAVENOUS | Status: AC
  Administered 2018-05-13: 2 g via INTRAVENOUS
  Filled 2018-05-13: qty 100

## 2018-05-13 MED ORDER — METHOCARBAMOL 500 MG PO TABS
500.0000 mg | ORAL_TABLET | Freq: Four times a day (QID) | ORAL | Status: DC | PRN
  Administered 2018-05-13 – 2018-05-15 (×5): 500 mg via ORAL
  Filled 2018-05-13 (×5): qty 1

## 2018-05-13 MED ORDER — PHENOL 1.4 % MT LIQD
1.0000 | OROMUCOSAL | Status: DC | PRN
  Filled 2018-05-13: qty 177

## 2018-05-13 MED ORDER — CHLORHEXIDINE GLUCONATE 4 % EX LIQD: 60.0000 mL | Freq: Once | CUTANEOUS | Status: DC

## 2018-05-13 MED ORDER — METOCLOPRAMIDE HCL 5 MG/ML IJ SOLN: 5.0000 mg | Freq: Three times a day (TID) | INTRAMUSCULAR | Status: DC | PRN

## 2018-05-13 SURGICAL SUPPLY — 45 items
ACETAB CUP W/GRIPTION 54 (Plate) ×2 IMPLANT
BAG ZIPLOCK 12X15 (MISCELLANEOUS) IMPLANT
BLADE SAW SGTL 18X1.27X75 (BLADE) ×2 IMPLANT
BLADE SURG SZ10 CARB STEEL (BLADE) ×4 IMPLANT
COVER PERINEAL POST (MISCELLANEOUS) ×2 IMPLANT
COVER SURGICAL LIGHT HANDLE (MISCELLANEOUS) ×2 IMPLANT
COVER WAND RF STERILE (DRAPES) IMPLANT
CUP ACETAB W/GRIPTION 54 (Plate) ×1 IMPLANT
DRAPE STERI IOBAN 125X83 (DRAPES) ×2 IMPLANT
DRAPE U-SHAPE 47X51 STRL (DRAPES) ×4 IMPLANT
DRESSING AQUACEL AG SP 3.5X10 (GAUZE/BANDAGES/DRESSINGS) ×1 IMPLANT
DRSG AQUACEL AG SP 3.5X10 (GAUZE/BANDAGES/DRESSINGS) ×2
DURAPREP 26ML APPLICATOR (WOUND CARE) ×2 IMPLANT
ELECT REM PT RETURN 15FT ADLT (MISCELLANEOUS) ×2 IMPLANT
GAUZE XEROFORM 1X8 LF (GAUZE/BANDAGES/DRESSINGS) ×2 IMPLANT
GLOVE BIO SURGEON STRL SZ7.5 (GLOVE) ×2 IMPLANT
GLOVE BIOGEL M 7.0 STRL (GLOVE) ×2 IMPLANT
GLOVE BIOGEL PI IND STRL 7.0 (GLOVE) ×2 IMPLANT
GLOVE BIOGEL PI IND STRL 7.5 (GLOVE) ×1 IMPLANT
GLOVE BIOGEL PI IND STRL 8 (GLOVE) ×2 IMPLANT
GLOVE BIOGEL PI INDICATOR 7.0 (GLOVE) ×2
GLOVE BIOGEL PI INDICATOR 7.5 (GLOVE) ×1
GLOVE BIOGEL PI INDICATOR 8 (GLOVE) ×2
GLOVE ECLIPSE 8.0 STRL XLNG CF (GLOVE) ×2 IMPLANT
GLOVE SURG SS PI 7.0 STRL IVOR (GLOVE) ×2 IMPLANT
GLOVE SURG SS PI 7.5 STRL IVOR (GLOVE) ×2 IMPLANT
GOWN SPEC L3 XXLG W/TWL (GOWN DISPOSABLE) ×2 IMPLANT
GOWN STRL REUS W/TWL LRG LVL3 (GOWN DISPOSABLE) ×2 IMPLANT
GOWN STRL REUS W/TWL XL LVL3 (GOWN DISPOSABLE) ×4 IMPLANT
HANDPIECE INTERPULSE COAX TIP (DISPOSABLE) ×1
HEAD CERAMIC DELTA 36 PLUS 1.5 (Hips) ×2 IMPLANT
HOLDER FOLEY CATH W/STRAP (MISCELLANEOUS) ×2 IMPLANT
LINER NEUTRAL 36ID 54OD (Liner) ×2 IMPLANT
PACK ANTERIOR HIP CUSTOM (KITS) ×2 IMPLANT
SET HNDPC FAN SPRY TIP SCT (DISPOSABLE) ×1 IMPLANT
STAPLER VISISTAT 35W (STAPLE) IMPLANT
STEM CORAIL KLA11 (Stem) ×2 IMPLANT
SUT ETHIBOND NAB CT1 #1 30IN (SUTURE) ×2 IMPLANT
SUT MNCRL AB 4-0 PS2 18 (SUTURE) IMPLANT
SUT VIC AB 0 CT1 36 (SUTURE) ×2 IMPLANT
SUT VIC AB 1 CT1 36 (SUTURE) ×2 IMPLANT
SUT VIC AB 2-0 CT1 27 (SUTURE) ×2
SUT VIC AB 2-0 CT1 TAPERPNT 27 (SUTURE) ×2 IMPLANT
TRAY FOLEY MTR SLVR 16FR STAT (SET/KITS/TRAYS/PACK) ×2 IMPLANT
YANKAUER SUCT BULB TIP 10FT TU (MISCELLANEOUS) ×2 IMPLANT

## 2018-05-13 NOTE — H&P (Signed)
TOTAL HIP ADMISSION H&P  Patient is admitted for right total hip arthroplasty.  Subjective:  Chief Complaint: right hip pain  HPI: [name] [name], 48 y.o. male, has a history of pain and functional disability in the right hip(s) due to arthritis and patient has failed non-surgical conservative treatments for greater than 12 weeks to include NSAID's and/or analgesics, corticosteriod injections, flexibility and strengthening excercises and activity modification.  Onset of symptoms was gradual starting 2 years ago with gradually worsening course since that time.The patient noted no past surgery on the right hip(s).  Patient currently rates pain in the right hip at 10 out of 10 with activity. Patient has night pain, worsening of pain with activity and weight bearing, trendelenberg gait, pain that interfers with activities of daily living and pain with passive range of motion. Patient has evidence of subchondral cysts, subchondral sclerosis, periarticular osteophytes and joint space narrowing by imaging studies. This condition presents safety issues increasing the risk of falls.  There is no current active infection.  Patient Active Problem List   Diagnosis Date Noted  . Unilateral primary osteoarthritis, right hip 02/09/2018  . Pain in left hip 11/16/2016  . Vitamin D deficiency 09/10/2015  . Hypocalcemia 08/12/2015  . Thoracic aortic aneurysm ([place]) 08/05/2015  . 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 Osteoarthritis of left hip 05/10/2015  . Status post total replacement of left hip 05/10/2015  . Thoracic aortic aneurysm, without rupture ([place]) 05/01/2015  . Incidental pulmonary nodule, > 62mm and < 59mm left lower lobe 05/01/2015  . H/O bicuspid aortic valve 05/01/2015  . Encounter for therapeutic drug level monitoring 04/29/2015  . HLD (hyperlipidemia) 01/03/2015  . 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 Malignant neoplasm of thyroid gland ([place]) 12/25/2014  . BP (high blood  pressure) 12/19/2014  . Thyroid mass 12/19/2014  . Dysphagia 12/19/2014  . Long term current use of anticoagulant 12/03/2014  . H/O aortic valve replacement 12/03/2014  . Presence of prosthetic heart valve 12/03/2014  . Aortic valve stenosis 12/03/2014  . Anticoagulated on warfarin 12/03/2014  . Degenerative arthritis of hip 03/17/2011   Past Medical History:  Diagnosis Date  . Aortic stenosis   . BP (high blood pressure) 12/19/2014   takes Lisinopril and Metoprolol daily  . Cancer ([place])    thyroid  . Complication of anesthesia   . Degenerative arthritis of hip 03/17/2011  . Environmental allergies    Albuterol inhaler as needed  . GERD (gastroesophageal reflux disease)    takes Zantac daily  . H/O aortic valve replacement 12/03/2014  . H/O bicuspid aortic valve 05/01/2015  . History of bronchitis   . HLD (hyperlipidemia)    was on Crestor but has been off for months(oct 2016)  . Hypothyroidism    takes Synthroid daily  . Incidental pulmonary nodule, > 60mm and < 44mm left lower lobe 05/01/2015  . Long term current use of anticoagulant 12/03/2014  . 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 PONV (postoperative nausea and vomiting)    nausea  . S/P thyroidectomy   . Thoracic aortic aneurysm, without rupture ([place]) 05/01/2015  . TIA (transient ischemic attack)   . Vertigo    d//t inner ear    Past Surgical History:  Procedure Laterality Date  . BENTALL PROCEDURE N/A 08/05/2015   Procedure: REDO BENTALL PROCEDURE with circ arrest using a 32 Hemashield platinum graft. ;  Surgeon: [name], MD;  Location: [facility];  Service: Open Heart Surgery;  Laterality: N/A;  POSSIBLE  CIRC ARREST  . CARDIAC VALVE SURGERY  at age 76/at age 66   AVReplacement 2003. Dr Berline Lopes in [facility]  . TEE WITHOUT CARDIOVERSION N/A 08/05/2015   Procedure: TRANSESOPHAGEAL ECHOCARDIOGRAM (TEE);  Surgeon: [name], MD;  Location: [facility];  Service: Open Heart Surgery;  Laterality: N/A;  .  THYROIDECTOMY     12/24/2014, Dr Meredith Leeds [facility]  . TOTAL HIP ARTHROPLASTY Left 05/10/2015   Procedure: LEFT TOTAL HIP ARTHROPLASTY ANTERIOR APPROACH;  Surgeon: Mcarthur Rossetti, MD;  Location: [facility];  Service: Orthopedics;  Laterality: Left;    No current facility-administered medications for this encounter.    No Known Allergies  Social History   Tobacco Use  . Smoking status: Never Smoker  . Smokeless tobacco: Never Used  Substance Use Topics  . Alcohol use: No    Alcohol/week: 0.0 standard drinks    Family History  Problem Relation Age of Onset  . Heart disease Mother        before age 73  . Heart disease Father        before age 36     Review of Systems  Musculoskeletal: Positive for joint pain.  All other systems reviewed and are negative.   Objective:  Physical Exam  Constitutional: He is oriented to person, place, and time. He appears well-developed and well-nourished.  HENT:  Head: Normocephalic and atraumatic.  Eyes: Pupils are equal, round, and reactive to light.  Neck: Normal range of motion.  Cardiovascular: Normal rate.  Respiratory: Effort normal.  GI: Soft.  Musculoskeletal:     Right hip: He exhibits decreased range of motion, decreased strength, tenderness and bony tenderness.  Neurological: He is alert and oriented to person, place, and time.  Skin: Skin is warm and dry.  Psychiatric: He has a normal mood and affect.    Vital signs in last 24 hours:    Labs:   Estimated body mass index is 30.16 kg/m as calculated from the following:   Height as of 05/02/18: 5\' 9"  (1.753 m).   Weight as of 05/02/18: 92.6 kg.   Imaging Review Plain radiographs demonstrate severe degenerative joint disease of the right hip(s). The bone quality appears to be excellent for age and reported activity level.    Preoperative templating of the joint replacement has been completed, documented, and submitted to the Operating Room personnel in order to  optimize intra-operative equipment management.     Assessment/Plan:  End stage arthritis, right hip(s)  The patient history, physical examination, clinical judgement of the provider and imaging studies are consistent with end stage degenerative joint disease of the right hip(s) and total hip arthroplasty is deemed medically necessary. The treatment options including medical management, injection therapy, arthroscopy and arthroplasty were discussed at length. The risks and benefits of total hip arthroplasty were presented and reviewed. The risks due to aseptic loosening, infection, stiffness, dislocation/subluxation,  thromboembolic complications and other imponderables were discussed.  The patient acknowledged the explanation, agreed to proceed with the plan and consent was signed. Patient is being admitted for inpatient treatment for surgery, pain control, PT, OT, prophylactic antibiotics, VTE prophylaxis, progressive ambulation and ADL's and discharge planning.The patient is planning to be discharged home with home health services

## 2018-05-13 NOTE — Progress Notes (Signed)
ANTICOAGULATION CONSULT NOTE - Initial Consult  Pharmacy Consult for warfarin Indication: History of aortic valve replacement   No Known Allergies  Patient Measurements: Height: 5\' 9"  (175.3 cm) Weight: 204 lb 4 oz (92.6 kg) IBW/kg (Calculated) : 70.7 Heparin Dosing Weight:   Vital Signs: Temp: 97.6 F (36.4 C) (01/03 1315) Temp Source: Oral (01/03 1315) BP: 127/86 (01/03 1315) Pulse Rate: 80 (01/03 1315)  Labs: No results for input(s): HGB, HCT, PLT, APTT, LABPROT, INR, HEPARINUNFRC, HEPRLOWMOCWT, CREATININE, CKTOTAL, CKMB, TROPONINI in the last 72 hours.  Estimated Creatinine Clearance: 116.7 mL/min (by C-G formula based on SCr of 0.88 mg/dL).   Medical History: Past Medical History:  Diagnosis Date  . Aortic stenosis   . BP (high blood pressure) 12/19/2014   takes Lisinopril and Metoprolol daily  . Cancer ([employer])    thyroid  . Complication of anesthesia   . Degenerative arthritis of hip 03/17/2011  . Environmental allergies    Albuterol inhaler as needed  . GERD (gastroesophageal reflux disease)    takes Zantac daily  . H/O aortic valve replacement 12/03/2014  . H/O bicuspid aortic valve 05/01/2015  . History of bronchitis   . HLD (hyperlipidemia)    was on Crestor but has been off for months(oct 2016)  . Hypothyroidism    takes Synthroid daily  . Incidental pulmonary nodule, > 78mm and < 33mm left lower lobe 05/01/2015  . Long term current use of anticoagulant 12/03/2014  . 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 PONV (postoperative nausea and vomiting)    nausea  . S/P thyroidectomy   . Thoracic aortic aneurysm, without rupture ([place]) 05/01/2015  . TIA (transient ischemic attack)   . Vertigo    d//t inner ear    Medications:  Scheduled:  . aspirin EC  81 mg Oral Daily  . docusate sodium  100 mg Oral BID  . [START ON 05/14/2018] enoxaparin (LOVENOX) injection  40 mg Subcutaneous Q24H  . ezetimibe  10 mg Oral QHS  . fentaNYL      . fentaNYL      .  [START ON 05/14/2018] levothyroxine  224 mcg Oral QAC breakfast  . pantoprazole  40 mg Oral Daily  . [START ON 05/19/2018] rosuvastatin  10 mg Oral Q Thu    Assessment: Pharmacy is consulted to dose warfarin in 48 yo male with PMH of  aortic valve replacement.  Per cardio note on 04/25/2018 "Recommendations: Patient is clinically stable from a cardiovascular standpoint. There is no cardiac contraindication to the planned right hip total replacement. This will be performed in [place]. He will be sent to the [facility] anticoagulation clinic to manage the Lovenox bridge. I would recommend discontinuing Lovenox 24 hours prior to the operation and resuming warfarin probably the next day following assuming no bleeding no abnormal bleeding issues, allowing the INR to naturally drift over the next 3 to 5 days."  Pt home regimen is warfarin 10 mg daily except 11 mg daily on Tuesday and Thursday.   Goal of Therapy: INR 2.5-3.5, per previous Coumadin clinic records in 2017  Monitor platelets by anticoagulation protocol: Yes   Plan:   Warfarin 12.5 mg PO x1   Pt also started on enoxaparin 40 mg daily   Daily PT/INR  Monitor for signs and symptoms of bleeding or clot    Royetta Asal, PharmD, BCPS Pager [phone] 05/13/2018 2:03 PM

## 2018-05-13 NOTE — Brief Op Note (Signed)
05/13/2018  11:36 AM  PATIENT:  [name] [name]  48 y.o. male  PRE-OPERATIVE DIAGNOSIS:  osteoarthritis right hip  POST-OPERATIVE DIAGNOSIS:  osteoarthritis right hip  PROCEDURE:  Procedure(s): RIGHT TOTAL HIP ARTHROPLASTY ANTERIOR APPROACH (Right)  SURGEON:  Surgeon(s) and Role:    Mcarthur Rossetti, MD - Primary  PHYSICIAN ASSISTANT: Benita Stabile, PA-C  ANESTHESIA:   general  EBL:  250 mL   COUNTS:  YES  TOURNIQUET:  * No tourniquets in log *  DICTATION: .Other Dictation: Dictation Number [id number]  PLAN OF CARE: Admit to inpatient   PATIENT DISPOSITION:  PACU - hemodynamically stable.   Delay start of Pharmacological VTE agent (>24hrs) due to surgical blood loss or risk of bleeding: no

## 2018-05-13 NOTE — Anesthesia Procedure Notes (Signed)
Procedure Name: Intubation Date/Time: 05/13/2018 10:24 AM Performed by: Montel Clock, CRNA Pre-anesthesia Checklist: Patient identified, Emergency Drugs available, Suction available, Patient being monitored and Timeout performed Patient Re-evaluated:Patient Re-evaluated prior to induction Oxygen Delivery Method: Circle system utilized Preoxygenation: Pre-oxygenation with 100% oxygen Induction Type: IV induction Ventilation: Mask ventilation without difficulty Laryngoscope Size: Mac and 3 Grade View: Grade I Tube type: Oral Tube size: 7.5 mm Number of attempts: 1 Airway Equipment and Method: Stylet Placement Confirmation: ETT inserted through vocal cords under direct vision,  positive ETCO2 and breath sounds checked- equal and bilateral Secured at: 23 cm Tube secured with: Tape Dental Injury: Teeth and Oropharynx as per pre-operative assessment

## 2018-05-13 NOTE — Evaluation (Signed)
Physical Therapy Evaluation Patient Details Name: [name] [name] MRN: [id number] DOB: August 13, 1970 Today's Date: 05/13/2018   History of Present Illness  48 yo male s/p R DA-THA on 05/13/18. PMH includes OA, thoracic aortic aneurysm, thyroid cancer 2016 with thyroidectomy, dysphagia, aortic valve replacement 2016, HTN, L DA-THA.  Clinical Impression   Pt presents with R hip pain, difficulty performing mobility tasks, decreased tolerance for activity, and hypotension with ambulation this session. Pt to benefit from acute PT to address deficits. Pt ambulated 3 ft with RW with min guard assist, cessation of ambulation with hypotension and near syncopal s/s (see mobility section: "After 3 ft ambulation, pt with pallor and stating he felt hot. Pt sat in recliner follow and reclined, BP and HR initially 80/57 with pulse of 84 bpm. PT applied cool cloth to pt forehead. After 2 minutes, BP and HR 95/64 and 85 bpm respectively. Pt much less symptomatic with this"). Pt educated on ankle pumps (20/hour) to perform this afternoon/evening to increase circulation, to pt's tolerance and limited by pain. PT to progress mobility as tolerated, and will continue to follow acutely.        Follow Up Recommendations Follow surgeon's recommendation for DC plan and follow-up therapies;Supervision for mobility/OOB(HHPT)    Equipment Recommendations  3in1 (PT)    Recommendations for Other Services       Precautions / Restrictions Precautions Precautions: Fall Restrictions Weight Bearing Restrictions: No Other Position/Activity Restrictions: WBAT       Mobility  Bed Mobility Overal bed mobility: Needs Assistance Bed Mobility: Supine to Sit;Sit to Supine     Supine to sit: Min assist;HOB elevated Sit to supine: Min assist;HOB elevated   General bed mobility comments: Min assist for LE lift and translation assist into and out of bed. Pt with increased time and effort to come to EOB, verbal cuing provided  throughout.   Transfers Overall transfer level: Needs assistance Equipment used: Rolling walker (2 wheeled) Transfers: Sit to/from [facility] Sit to Stand: Min assist;From elevated surface;+2 safety/equipment Stand pivot transfers: Min assist       General transfer comment: Min assist for power up and steadying upon standing. Verbal cuing for hand placement. Stand pivot from recliner to bed after hypotensive episode, min assist for steadying.   Ambulation/Gait Ambulation/Gait assistance: Min guard;+2 safety/equipment Gait Distance (Feet): 3 Feet Assistive device: Rolling walker (2 wheeled) Gait Pattern/deviations: Step-to pattern;Decreased stance time - right;Decreased weight shift to right;Antalgic Gait velocity: decr    General Gait Details: Min guard for safety. Verbal cuing for sequencing. After 3 ft ambulation, pt with pallor and stating he felt hot. Pt sat in recliner follow and reclined, BP and HR initially 80/57 with pulse of 84 bpm. PT applied cool cloth to pt forehead. After 2 minutes, BP and HR 95/64 and 85 bpm respectively. Pt much less symptomatic with this.   Stairs            Wheelchair Mobility    Modified Rankin (Stroke Patients Only)       Balance Overall balance assessment: Mild deficits observed, not formally tested                                           Pertinent Vitals/Pain Pain Assessment: 0-10 Pain Score: 3  Pain Location: R hip  Pain Descriptors / Indicators: Aching Pain Intervention(s): Limited activity within patient's tolerance;Repositioned;Ice applied;Monitored during session  Home Living Family/patient expects to be discharged to:: Private residence Living Arrangements: Spouse/significant other Available Help at Discharge: Available PRN/intermittently;Family(son will be helping for the first week, wife works ) Type of Home: House Home Access: Stairs to enter Entrance Stairs-Rails:  None Technical brewer of Steps: 1+1 Home Layout: Two level;Able to live on main level with bedroom/bathroom Home Equipment: Gilford Rile - 2 wheels;Cane - single point      Prior Function Level of Independence: Independent               Hand Dominance   Dominant Hand: Right    Extremity/Trunk Assessment   Upper Extremity Assessment Upper Extremity Assessment: Overall WFL for tasks assessed    Lower Extremity Assessment Lower Extremity Assessment: RLE deficits/detail;Overall WFL for tasks assessed RLE Deficits / Details: suspected post-surgical weakness; able to perform ankle pumps, weak quad set, SLR with mod lift assist.  RLE Sensation: WNL    Cervical / Trunk Assessment Cervical / Trunk Assessment: Normal  Communication   Communication: No difficulties  Cognition Arousal/Alertness: Awake/alert Behavior During Therapy: WFL for tasks assessed/performed Overall Cognitive Status: Within Functional Limits for tasks assessed                                        General Comments      Exercises     Assessment/Plan    PT Assessment Patient needs continued PT services  PT Problem List Decreased strength;Pain;Decreased activity tolerance;Decreased knowledge of use of DME;Decreased balance;Decreased mobility       PT Treatment Interventions Therapeutic activities;DME instruction;Gait training;Therapeutic exercise;Patient/family education;Stair training;Balance training;Functional mobility training    PT Goals (Current goals can be found in the Care Plan section)  Acute Rehab PT Goals PT Goal Formulation: With patient Time For Goal Achievement: 05/20/18 Potential to Achieve Goals: Good    Frequency 7X/week   Barriers to discharge        Co-evaluation               AM-PAC PT "6 Clicks" Mobility  Outcome Measure Help needed turning from your back to your side while in a flat bed without using bedrails?: A Little Help needed moving  from lying on your back to sitting on the side of a flat bed without using bedrails?: A Little Help needed moving to and from a bed to a chair (including a wheelchair)?: A Little Help needed standing up from a chair using your arms (e.g., wheelchair or bedside chair)?: A Little Help needed to walk in hospital room?: A Little Help needed climbing 3-5 steps with a railing? : A Little 6 Click Score: 18    End of Session Equipment Utilized During Treatment: Gait belt Activity Tolerance: Other (comment)(hypotension) Patient left: in bed;with bed alarm set;with call bell/phone within reach;with family/visitor present;with SCD's reapplied Nurse Communication: Mobility status;Other (comment)(BP response to activity) PT Visit Diagnosis: Other abnormalities of gait and mobility (R26.89);Difficulty in walking, not elsewhere classified (R26.2)    Time: 1715-1750 PT Time Calculation (min) (ACUTE ONLY): 35 min   Charges:   PT Evaluation $PT Eval Low Complexity: 1 Low PT Treatments $Therapeutic Activity: 8-22 mins       Julien Girt, PT Acute Rehabilitation Services Pager [phone]  Office [phone]   Analynn Daum D Jordanny Waddington 05/13/2018, 8:11 PM

## 2018-05-13 NOTE — Op Note (Signed)
[name] [name], [name] [name] MEDICAL RECORD DX:[id number] ACCOUNT [id number] DATE OF BIRTH:Nov 26, 1970 FACILITY: [facility] LOCATION: [facility] PHYSICIAN:Kento Gossman Kerry Fort, MD  OPERATIVE REPORT  DATE OF PROCEDURE:  05/13/2018  PREOPERATIVE DIAGNOSIS:  Primary osteoarthritis and degenerative joint disease, right hip.  POSTOPERATIVE DIAGNOSIS:  Primary osteoarthritis and degenerative joint disease, right hip.  PROCEDURE:  Right total hip arthroplasty through direct anterior approach.  IMPLANTS:  DePuy Sector Gription acetabular component size 54, size 36+0 neutral polyethylene liner, size 11 Corail femoral component with varus offset, size 36+1.5 ceramic hip ball.  SURGEON:  [name] Guest. Ninfa Linden, MD  ASSISTANT:  Erskine Emery, PA-C  ANESTHESIA:  General.  ANTIBIOTICS:  Two grams IV Ancef.  ESTIMATED BLOOD LOSS:  397-673 mL  COMPLICATIONS:  None.  INDICATIONS:  The patient is a 48 year old gentleman with debilitating arthritis involving his right hip.  This has been well documented for a long period of time.  He has actually had a successful left total hip arthroplasty done 3 years ago in 2016.   His pain is daily.  His x-rays do show complete loss of the joint space on his right hip.  At this point, he does wish to proceed with total hip arthroplasty on the right side due to the detrimental effects his right hip pain, has had on his mobility,  his quality of life, and his activities of daily living.  Having had this done before, he is fully aware of the risk of acute blood loss anemia, nerve or vessel injury, fracture, infection, dislocation, DVT as well as implant failure.  He understands our  goals are to decrease pain, improve mobility and overall improve quality of life.  DESCRIPTION OF PROCEDURE:  After informed consent was obtained and appropriate right hip was marked he was brought to the operating room and while he was on a stretcher in the supine position, general anesthesia was  then obtained.  A Foley catheter was  placed.  Both feet had traction boots applied to them.  Next, he was placed supine on the Hana fracture table, the perineal post in place and both legs in line skeletal traction device and no traction applied.  His right operative hip was prepped and  draped with DuraPrep and sterile drapes.  A time-out was called to identify correct patient, correct right hip.  We then made an incision just inferior and posterior to the anterior superior iliac spine and carried this obliquely down the leg.  We  dissected down tensor fascia lata muscle.  The tensor fascia was then divided longitudinally to proceed with direct anterior approach to the hip.  We identified and cauterized circumflex vessels.  I then identified the hip capsule, opened the hip capsule  in an L-type format, finding moderate joint effusion and significant periarticular osteophytes around the femoral head and neck.  We then placed a Cobra retractor around the medial and lateral femoral neck and made our femoral neck cut with an  oscillating saw and completed this with an osteotome.  We placed a corkscrew guide in the femoral head and removed the femoral heads entirety and found a wide area devoid of cartilage.  We then placed a bent Hohmann over the medial acetabular rim and  removed the acetabular labrum and other debris.  We then began reaming under direct visualization from a size 44 reamer in stepwise increments up to a size 53 with all reamers under direct visualization, the last reamer under direct fluoroscopy, so we  could obtain our depth of reaming, our inclination  and anteversion.  We then placed the real DePuy Sector Gription acetabular component size 54 and a 36+0 neutral polyethylene liner for that size acetabular component.  Attention was then turned to the  femur.  With the leg externally rotated to 120 degrees, extended and adducted, we are to place a Mueller retractor medially and Hohman  retractor behind the greater trochanter, released lateral joint capsule and the capsule and used a box-cutting  osteotome and enter the femoral canal and a rongeur to lateralize.  We then began broaching using Corail broaching system from a size 8 broach, going up to a size 11.  With a size 11 in place, we tried a varus offset femoral neck and 36+1.5 hip ball.  We  brought the leg back over and up and with traction and internal rotation, reducing the pelvis.  We were pleased with the leg length, offset, range of motion and stability.  We then dislocated the hip and removed the trial components.  We placed then the  real Corail femoral component size 13 with varus offset and the real 36+1.5 ceramic hip ball and again reduced this in the acetabulum.  We were pleased with range of motion, offset and stability as well as placement under direct visualization, direct  fluoroscopy and clinical exam.  We then irrigated the soft tissue with normal saline solution using pulsatile lavage.  We were able to close the joint capsule with interrupted #1 Ethibond suture, followed by running 0 Vicryl and tensor fascia, 0 Vicryl  in the deep tissue, 2-0 Vicryl subcutaneous tissue and interrupted staples on the skin.  Xeroform and Aquacel dressing was applied.  He was taken off the Hana table, awakened, extubated, and taken to recovery room in stable condition.  All final counts  were correct.  No complications noted.  Of note, Benita Stabile, PA-C, assisted the entire case from beginning to end.  His assistance was crucial in facilitating all aspects of this case.  TN/NUANCE  D:05/13/2018 T:05/13/2018 JOB:[id number]

## 2018-05-13 NOTE — Anesthesia Postprocedure Evaluation (Signed)
Anesthesia Post Note  Patient: Fines Kimberlin  Procedure(s) Performed: RIGHT TOTAL HIP ARTHROPLASTY ANTERIOR APPROACH (Right Hip)     Patient location during evaluation: PACU Anesthesia Type: General Level of consciousness: awake and alert Pain management: pain level controlled Vital Signs Assessment: post-procedure vital signs reviewed and stable Respiratory status: spontaneous breathing, nonlabored ventilation, respiratory function stable and patient connected to nasal cannula oxygen Cardiovascular status: blood pressure returned to baseline and stable Postop Assessment: no apparent nausea or vomiting Anesthetic complications: no    Last Vitals:  Vitals:   05/13/18 1619 05/13/18 1708  BP: 108/72 112/76  Pulse: 78 77  Resp: 16 16  Temp: (!) 36.4 C 36.6 C  SpO2: 100% 100%    Last Pain:  Vitals:   05/13/18 1708  TempSrc: Oral  PainSc:                  Lidia Collum

## 2018-05-13 NOTE — Transfer of Care (Signed)
Immediate Anesthesia Transfer of Care Note  Patient: [name] [name]  Procedure(s) Performed: RIGHT TOTAL HIP ARTHROPLASTY ANTERIOR APPROACH (Right Hip)  Patient Location: PACU  Anesthesia Type:General  Level of Consciousness: drowsy and patient cooperative  Airway & Oxygen Therapy: Patient Spontanous Breathing and Patient connected to face mask oxygen  Post-op Assessment: Report given to RN and Post -op Vital signs reviewed and stable  Post vital signs: Reviewed and stable  Last Vitals:  Vitals Value Taken Time  BP 136/99 05/13/2018 12:05 PM  Temp    Pulse 91 05/13/2018 12:06 PM  Resp 15 05/13/2018 12:06 PM  SpO2 100 % 05/13/2018 12:06 PM  Vitals shown include unvalidated device data.  Last Pain:  Vitals:   05/13/18 0755  TempSrc:   PainSc: 6       Patients Stated Pain Goal: 5 (31/28/11 8867)  Complications: No apparent anesthesia complications

## 2018-05-13 NOTE — Anesthesia Preprocedure Evaluation (Signed)
Anesthesia Evaluation  Patient identified by MRN, date of birth, ID band Patient awake    Reviewed: Allergy & Precautions, NPO status , Patient's Chart, lab work & pertinent test results  History of Anesthesia Complications Negative for: history of anesthetic complications  Airway Mallampati: II  TM Distance: >3 FB Neck ROM: Full    Dental no notable dental hx. (+) Teeth Intact   Pulmonary neg pulmonary ROS,    breath sounds clear to auscultation       Cardiovascular hypertension, Pt. on medications + Valvular Problems/Murmurs AS  Rhythm:Regular Rate:Normal + Systolic murmurs (murmur + mechanical valve click) S/p open cardiac surgery x3   Neuro/Psych negative neurological ROS  negative psych ROS   GI/Hepatic negative GI ROS, Neg liver ROS,   Endo/Other  Hypothyroidism   Renal/GU negative Renal ROS  negative genitourinary   Musculoskeletal negative musculoskeletal ROS (+)   Abdominal (+) + obese,   Peds  Hematology negative hematology ROS (+)   Anesthesia Other Findings 48 yo M for R THA - extensive cardiac surgical hx at age 46, 72, and 25; s/p AVR and ascending aortic graft; last f/u with cardiac surgeon in 02/18 with no issues; saw cardiologist 04/25/18 who said he is stable from a cardiac standpoint and OK to proceed with planned surgery - other PMH includes hypothyroidism, HTN, GERD  Reproductive/Obstetrics                            Anesthesia Physical Anesthesia Plan  ASA: III  Anesthesia Plan: General   Post-op Pain Management:    Induction: Intravenous  PONV Risk Score and Plan: 2 and Ondansetron, Dexamethasone, Midazolam and Treatment may vary due to age or medical condition  Airway Management Planned: Oral ETT  Additional Equipment: None  Intra-op Plan:   Post-operative Plan: Extubation in OR  Informed Consent: I have reviewed the patients History and Physical,  chart, labs and discussed the procedure including the risks, benefits and alternatives for the proposed anesthesia with the patient or authorized representative who has indicated his/her understanding and acceptance.   Dental advisory given  Plan Discussed with:   Anesthesia Plan Comments:         Anesthesia Quick Evaluation

## 2018-05-13 NOTE — Progress Notes (Signed)
Wrong entry on timeout.. no timeout on this pt.

## 2018-05-14 LAB — BASIC METABOLIC PANEL
Anion gap: 11 (ref 5–15)
BUN: 14 mg/dL (ref 6–20)
CO2: 23 mmol/L (ref 22–32)
Calcium: 7.5 mg/dL — ABNORMAL LOW (ref 8.9–10.3)
Chloride: 103 mmol/L (ref 98–111)
Creatinine, Ser: 0.87 mg/dL (ref 0.61–1.24)
GFR calc Af Amer: >60 mL/min (ref >60)
GFR calc non Af Amer: >60 mL/min (ref >60)
Glucose, Bld: 145 mg/dL — ABNORMAL HIGH (ref 70–99)
Potassium: 4.6 mmol/L (ref 3.5–5.1)
Sodium: 137 mmol/L (ref 135–145)

## 2018-05-14 LAB — CBC
HCT: 40.6 % (ref 39.0–52.0)
Hemoglobin: 13 g/dL (ref 13.0–17.0)
MCH: 29.1 pg (ref 26.0–34.0)
MCHC: 32 g/dL (ref 30.0–36.0)
MCV: 91 fL (ref 80.0–100.0)
Platelets: 225 10*3/uL (ref 150–400)
RBC: 4.46 MIL/uL (ref 4.22–5.81)
RDW: 13.2 % (ref 11.5–15.5)
WBC: 13.6 10*3/uL — ABNORMAL HIGH (ref 4.0–10.5)
nRBC: 0 % (ref 0.0–0.2)

## 2018-05-14 LAB — PROTIME-INR
INR: 1.03
PROTHROMBIN TIME: 13.4 s (ref 11.4–15.2)

## 2018-05-14 MED ORDER — ENOXAPARIN SODIUM 100 MG/ML ~~LOC~~ SOLN
100.0000 mg | Freq: Two times a day (BID) | SUBCUTANEOUS | Status: DC
  Administered 2018-05-14 – 2018-05-15 (×2): 100 mg via SUBCUTANEOUS
  Filled 2018-05-14 (×3): qty 1

## 2018-05-14 MED ORDER — WARFARIN SODIUM 5 MG PO TABS
15.0000 mg | ORAL_TABLET | Freq: Once | ORAL | Status: AC
  Administered 2018-05-14: 15 mg via ORAL
  Filled 2018-05-14: qty 3

## 2018-05-14 MED ORDER — METHOCARBAMOL 500 MG PO TABS: 500.0000 mg | ORAL_TABLET | Freq: Four times a day (QID) | ORAL | 0 refills | Status: DC | PRN

## 2018-05-14 MED ORDER — OXYCODONE HCL 5 MG PO TABS: 5.0000 mg | ORAL_TABLET | ORAL | 0 refills | Status: DC | PRN

## 2018-05-14 NOTE — Evaluation (Addendum)
Occupational Therapy Evaluation Patient Details Name: [name] [name] MRN: [id number] DOB: [birth year] Today's Date: 05/14/2018    History of Present Illness 48 yo male s/p R DA-THA on 05/13/18. PMH includes OA, thoracic aortic aneurysm, thyroid cancer 2016 with thyroidectomy, dysphagia, aortic valve replacement 2016, HTN, L DA-THA.   Clinical Impression   This 48 y/o male presents with the above. At baseline pt is independent with ADLs and functional mobility. Pt presents up in recliner pleasant and willing to participate in therapy session. Pt mostly limited due to pain at this time, requiring minA for room level mobility using RW, completing standing grooming ADLs with close minguard assist and standing toileting with minA. He currently requires modA for LB ADL secondary to difficulty accessing LEs. Pt will return home with family who are able to assist PRN at time of discharge. Pt will benefit from continued OT services while in acute setting to maximize his overall safety and independence with ADLs and mobility prior to return home. Will follow.     Follow Up Recommendations  Follow surgeon's recommendation for DC plan and follow-up therapies;Supervision/Assistance - 24 hour(24hr initially)    Equipment Recommendations  3 in 1 bedside commode           Precautions / Restrictions Precautions Precautions: Fall Restrictions Weight Bearing Restrictions: No Other Position/Activity Restrictions: WBAT       Mobility Bed Mobility Overal bed mobility: Needs Assistance Bed Mobility: Sit to Supine      Sit to supine: Min assist   General bed mobility comments: assist for RLE management  Transfers Overall transfer level: Needs assistance Equipment used: Rolling walker (2 wheeled) Transfers: Sit to/from Stand Sit to Stand: Min guard         General transfer comment: Min guard for safety. Verbal cuing for hand and LE placement.     Balance Overall balance assessment: Mild  deficits observed, not formally tested                                         ADL either performed or assessed with clinical judgement   ADL Overall ADL's : Needs assistance/impaired Eating/Feeding: Independent   Grooming: Oral care;Min guard;Standing   Upper Body Bathing: Min guard;Sitting   Lower Body Bathing: Sit to/from stand;Minimal assistance Lower Body Bathing Details (indicate cue type and reason): minguard standing balance Upper Body Dressing : Set up;Min guard;Sitting   Lower Body Dressing: Moderate assistance;Sit to/from stand Lower Body Dressing Details (indicate cue type and reason): increased assist to access LEs; minguard to minA for standing balance Toilet Transfer: Minimal assistance;Ambulation;BSC;RW Toilet Transfer Details (indicate cue type and reason): BSC over toilet; simulated in transfer to/from recliner, room level ambulation and standing toileting this session Toileting- Clothing Manipulation and Hygiene: Minimal assistance;Sit to/from stand Toileting - Clothing Manipulation Details (indicate cue type and reason): pt standing to void badder, spouse providing minA for static standing during task per pt request with therapist assisting for transfer into/out of bathroom   Tub/Shower Transfer Details (indicate cue type and reason): verbally reviewed and demonstrated in room tub transfer technique to 3:1 using RW; also educated on safe transfer sequence to walk-in shower as pt reports plans to shower this evening; will continue to review and practice as pt is able to tolerate Functional mobility during ADLs: Minimal assistance;Rolling walker General ADL Comments: pt with increased pain during mobility, educated on activity progression after return  home, safety and compensatory technique for ADL and functional transfers. pt ambulated to bathroom, completed standing grooming and toileting ADL during this session     Vision         Perception      Praxis      Pertinent Vitals/Pain Pain Assessment: Faces Pain Score: 3  Faces Pain Scale: Hurts even more Pain Location: R hip, R thigh Pain Descriptors / Indicators: Sore;Aching Pain Intervention(s): Limited activity within patient's tolerance;Monitored during session;Repositioned;Ice applied     Hand Dominance Right   Extremity/Trunk Assessment Upper Extremity Assessment Upper Extremity Assessment: Overall WFL for tasks assessed   Lower Extremity Assessment Lower Extremity Assessment: Defer to PT evaluation       Communication Communication Communication: No difficulties   Cognition Arousal/Alertness: Awake/alert Behavior During Therapy: WFL for tasks assessed/performed Overall Cognitive Status: Within Functional Limits for tasks assessed                                     General Comments  pt spouse present and supportive during session    Exercises Total Joint Exercises Ankle Circles/Pumps: AROM;Both;20 reps;Supine Quad Sets: AROM;Right;10 reps;Supine Heel Slides: AAROM;Right;5 reps;Supine Hip ABduction/ADduction: AAROM;Right;10 reps;Supine   Shoulder Instructions      Home Living Family/patient expects to be discharged to:: Private residence Living Arrangements: Spouse/significant other Available Help at Discharge: Family;Available PRN/intermittently(son will be helping the first week; wife works ) Type of Home: House Home Access: Stairs to enter [employer] of Steps: 1+1 Entrance Stairs-Rails: None Home Layout: Two level;Able to live on main level with bedroom/bathroom     Bathroom Shower/Tub: Teacher, early years/pre: Standard     Home Equipment: Environmental consultant - 2 wheels;Cane - single point;Bedside commode          Prior Functioning/Environment Level of Independence: Independent                 OT Problem List: Decreased strength;Decreased range of motion;Decreased activity tolerance;Impaired balance  (sitting and/or standing);Decreased knowledge of use of DME or AE;Pain      OT Treatment/Interventions: Self-care/ADL training;Therapeutic exercise;DME and/or AE instruction;Therapeutic activities;Patient/family education;Balance training    OT Goals(Current goals can be found in the care plan section) Acute Rehab OT Goals Patient Stated Goal: less pain, return to independence OT Goal Formulation: With patient Time For Goal Achievement: 05/28/18 Potential to Achieve Goals: Good ADL Goals Pt Will Perform Grooming: with modified independence;standing Pt Will Perform Lower Body Bathing: with supervision;sit to/from stand Pt Will Perform Lower Body Dressing: with supervision;sit to/from stand Pt Will Transfer to Toilet: with supervision;ambulating;bedside commode(BSC over toilet) Pt Will Perform Toileting - Clothing Manipulation and hygiene: with supervision;sit to/from stand Pt Will Perform Tub/Shower Transfer: Tub transfer;with min guard assist;3 in 1;rolling walker;ambulating  OT Frequency: Min 2X/week   Barriers to D/C:            Co-evaluation              AM-PAC OT "6 Clicks" Daily Activity     Outcome Measure Help from another person eating meals?: None Help from another person taking care of personal grooming?: None Help from another person toileting, which includes using toliet, bedpan, or urinal?: A Little Help from another person bathing (including washing, rinsing, drying)?: A Little Help from another person to put on and taking off regular upper body clothing?: None Help from another person to put on and taking  off regular lower body clothing?: A Lot 6 Click Score: 20   End of Session Equipment Utilized During Treatment: Gait belt;Rolling walker Nurse Communication: Mobility status  Activity Tolerance: Patient tolerated treatment well;Patient limited by pain Patient left: in bed;with call bell/phone within reach;with family/visitor present;with SCD's  reapplied  OT Visit Diagnosis: Other abnormalities of gait and mobility (R26.89);Pain Pain - Right/Left: Right Pain - part of body: Hip                Time: 1324-4010 OT Time Calculation (min): 36 min Charges:  OT General Charges $OT Visit: 1 Visit OT Evaluation $OT Eval Low Complexity: 1 Low OT Treatments $Self Care/Home Management : 8-22 mins  Lou Cal, OT [facility] Pager [phone] Office [place] 05/14/2018, 1:33 PM

## 2018-05-14 NOTE — Plan of Care (Signed)

## 2018-05-14 NOTE — Care Management Note (Signed)
Case Management Note  Patient Details  Name: [name] [name] MRN: [id number] Date of Birth: 18-May-1970  Subjective/Objective:      Right THA              Action/Plan: NCM spoke to pt and wife at bedside. Offered choice for HH/CMS list provided/placed on chart. Pt reports having a RW at home. Contacted [facility] for 3n1 bedside commode to be delivered to room prior to dc.   Expected Discharge Date:  05/15/2017            Expected Discharge Plan:  [place]  In-House Referral:  NA  Discharge planning Services  CM Consult  Post Acute Care Choice:  Home Health Choice offered to:  Patient  DME Arranged:  3-N-1 DME Agency:  [place]:  PT [place] Agency:  [facility]  Status of Service:  Completed, signed off  If discussed at [employer] of Stay Meetings, dates discussed:    Additional Comments:  Erenest Rasher, RN 05/14/2018, 1:36 PM

## 2018-05-14 NOTE — Progress Notes (Signed)
Subjective: 1 Day Post-Op Procedure(s) (LRB): RIGHT TOTAL HIP ARTHROPLASTY ANTERIOR APPROACH (Right) Patient reports pain as moderate.    Objective: Vital signs in last 24 hours: Temp:  [97.5 F (36.4 C)-99.1 F (37.3 C)] 98.4 F (36.9 C) (01/04 0547) Pulse Rate:  [75-97] 80 (01/04 0547) Resp:  [14-18] 16 (01/04 0547) BP: (108-137)/(72-99) 109/80 (01/04 0547) SpO2:  [94 %-100 %] 97 % (01/04 0547) Weight:  [92.6 kg] 92.6 kg (01/03 1315)  Intake/Output from previous day: 01/03 0701 - 01/04 0700 In: 2540.6 [I.V.:2540.6] Out: 2000 [Urine:1700; Blood:300] Intake/Output this shift: Total I/O In: 480 [P.O.:480] Out: -   Recent Labs    05/13/18 1410 05/14/18 0542  HGB 14.2 13.0   Recent Labs    05/13/18 1410 05/14/18 0542  WBC 15.0* 13.6*  RBC 4.89 4.46  HCT 43.6 40.6  PLT 241 225   Recent Labs    05/13/18 1410 05/14/18 0542  NA  --  137  K  --  4.6  CL  --  103  CO2  --  23  BUN  --  14  CREATININE 1.06 0.87  GLUCOSE  --  145*  CALCIUM  --  7.5*   Recent Labs    05/14/18 0542  INR 1.03    Sensation intact distally Intact pulses distally Dorsiflexion/Plantar flexion intact Incision: dressing C/D/I  Assessment/Plan: 1 Day Post-Op Procedure(s) (LRB): RIGHT TOTAL HIP ARTHROPLASTY ANTERIOR APPROACH (Right) Up with therapy Plan for discharge tomorrow Discharge home with home health    Mcarthur Rossetti 05/14/2018, 9:24 AM

## 2018-05-14 NOTE — Discharge Instructions (Signed)

## 2018-05-14 NOTE — Progress Notes (Signed)
Physical Therapy Treatment Patient Details Name: [name] [name] MRN: [id number] DOB: Jul 28, 1970 Today's Date: 05/14/2018    History of Present Illness 48 yo male s/p R DA-THA on 05/13/18. PMH includes OA, thoracic aortic aneurysm, thyroid cancer 2016 with thyroidectomy, dysphagia, aortic valve replacement 2016, HTN, L DA-THA.    PT Comments    Pt with improved ambulation distance this session, limited by R hip/hip flexor soreness and fatigue. Pt tolerated LE exercises well and required min verbal cuing to perform them. PT to see pt for second session this afternoon to practice more LE exercises, ambulation, and stair navigation. PT to continue to follow acutely.    Follow Up Recommendations  Follow surgeon's recommendation for DC plan and follow-up therapies;Supervision for mobility/OOB(HHPT)     Equipment Recommendations  3in1 (PT)    Recommendations for Other Services       Precautions / Restrictions Precautions Precautions: Fall Restrictions Weight Bearing Restrictions: No Other Position/Activity Restrictions: WBAT     Mobility  Bed Mobility Overal bed mobility: Needs Assistance Bed Mobility: Supine to Sit     Supine to sit: Min assist;HOB elevated     General bed mobility comments: Min assist for RLE lifting/translation to EOB. Increased time and effort to due R thigh pain. Pt with multiple scoots to get to EOB.   Transfers Overall transfer level: Needs assistance Equipment used: Rolling walker (2 wheeled) Transfers: Sit to/from Stand Sit to Stand: From elevated surface;Min guard         General transfer comment: Min guard for safety. Verbal cuing for hand placement. Pt anxious prior to standing, stating "are you sure I can't hurt this hip replacement?"  Ambulation/Gait Ambulation/Gait assistance: Min guard;+2 safety/equipment(chair follow) Gait Distance (Feet): 150 Feet Assistive device: Rolling walker (2 wheeled) Gait Pattern/deviations: Step-to  pattern;Decreased weight shift to right;Antalgic;Step-through pattern;Decreased stance time - right;Trunk flexed Gait velocity: decr    General Gait Details: Min guard to supervision for safety. Verbal cuing for sequencing in order to progress to step-through gait, erect posture. Pt with quad soreness with fully erect posture due to quad stretching with this.    Stairs             Wheelchair Mobility    Modified Rankin (Stroke Patients Only)       Balance Overall balance assessment: Mild deficits observed, not formally tested                                          Cognition Arousal/Alertness: Awake/alert Behavior During Therapy: WFL for tasks assessed/performed Overall Cognitive Status: Within Functional Limits for tasks assessed                                        Exercises Total Joint Exercises Ankle Circles/Pumps: AROM;Both;20 reps;Supine Quad Sets: AROM;Right;10 reps;Supine Heel Slides: AAROM;Right;5 reps;Supine Hip ABduction/ADduction: AAROM;Right;10 reps;Supine    General Comments        Pertinent Vitals/Pain Pain Assessment: 0-10 Pain Score: 3  Pain Location: R hip, R thigh Pain Descriptors / Indicators: Sore;Aching Pain Intervention(s): Repositioned;Limited activity within patient's tolerance;Ice applied;Monitored during session;Premedicated before session    Home Living                      Prior Function  PT Goals (current goals can now be found in the care plan section) Acute Rehab PT Goals PT Goal Formulation: With patient Time For Goal Achievement: 05/20/18 Potential to Achieve Goals: Good Progress towards PT goals: Progressing toward goals    Frequency    7X/week      PT Plan Current plan remains appropriate    Co-evaluation              AM-PAC PT "6 Clicks" Mobility   Outcome Measure  Help needed turning from your back to your side while in a flat bed without  using bedrails?: A Little Help needed moving from lying on your back to sitting on the side of a flat bed without using bedrails?: A Little Help needed moving to and from a bed to a chair (including a wheelchair)?: A Little Help needed standing up from a chair using your arms (e.g., wheelchair or bedside chair)?: A Little Help needed to walk in hospital room?: A Little Help needed climbing 3-5 steps with a railing? : A Little 6 Click Score: 18    End of Session Equipment Utilized During Treatment: Gait belt Activity Tolerance: Patient tolerated treatment well;Patient limited by fatigue Patient left: with call bell/phone within reach;with family/visitor present;with SCD's reapplied;in chair Nurse Communication: Mobility status PT Visit Diagnosis: Other abnormalities of gait and mobility (R26.89);Difficulty in walking, not elsewhere classified (R26.2)     Time: 1610-9604 PT Time Calculation (min) (ACUTE ONLY): 34 min  Charges:  $Gait Training: 8-22 mins $Therapeutic Exercise: 8-22 mins                     Julien Girt, PT Acute Rehabilitation Services Pager [phone]  Office [phone]  Carlinda Ohlson D Rishav Rockefeller 05/14/2018, 11:00 AM

## 2018-05-14 NOTE — Progress Notes (Signed)
ANTICOAGULATION CONSULT NOTE  Pharmacy Consult for warfarin Indication: History of aortic valve replacement   No Known Allergies  Patient Measurements: Height: 5\' 9"  (175.3 cm) Weight: 204 lb 4 oz (92.6 kg) IBW/kg (Calculated) : 70.7  Vital Signs: Temp: 98.5 F (36.9 C) (01/04 0924) Temp Source: Oral (01/04 0924) BP: 117/78 (01/04 0924) Pulse Rate: 82 (01/04 0924)  Labs: Recent Labs    05/13/18 1410 05/14/18 0542  HGB 14.2 13.0  HCT 43.6 40.6  PLT 241 225  LABPROT  --  13.4  INR  --  1.03  CREATININE 1.06 0.87    Estimated Creatinine Clearance: 118 mL/min (by C-G formula based on SCr of 0.87 mg/dL).  Medications:  Scheduled:  . aspirin EC  81 mg Oral Daily  . docusate sodium  100 mg Oral BID  . enoxaparin (LOVENOX) injection  100 mg Subcutaneous Q12H  . ezetimibe  10 mg Oral QHS  . levothyroxine  224 mcg Oral QAC breakfast  . pantoprazole  40 mg Oral Daily  . [START ON 05/19/2018] rosuvastatin  10 mg Oral Q Thu  . Warfarin - Pharmacist Dosing Inpatient   Does not apply q1800    Assessment: Pharmacy is consulted to dose warfarin in 48 yo male with PMH of aortic valve replacement.  Per cardio note on 04/25/2018:  "Recommendations: Patient is clinically stable from a cardiovascular standpoint. There is no cardiac contraindication to the planned right hip total replacement. This will be performed in [place]. He will be sent to the [facility] anticoagulation clinic to manage the Lovenox bridge. I would recommend discontinuing Lovenox 24 hours prior to the operation and resuming warfarin probably the next day following assuming no bleeding no abnormal bleeding issues, allowing the INR to naturally drift over the next 3 to 5 days."  Pt home regimen is warfarin 10 mg daily except 11 mg daily on Tuesday and Thursday.  Today, 05/14/2018:  CBC: WNL  INR non-therapeutic as expected after warfarin held pre-op  Major drug interactions: none, also on ASA 81 mg  from home and Lovenox 40 mg daily per Ortho  No bleeding issues per nursing  Eating 100% of meals  Goal of Therapy: INR 2.5-3.5, per previous Coumadin clinic records in 2017  Monitor platelets by anticoagulation protocol: Yes   Plan:   Warfarin 15 mg PO x1 today  Pt also started on enoxaparin 40 mg daily   Daily INR  Monitor for signs and symptoms of bleeding or clot   Reuel Boom, PharmD, BCPS [phone] 05/14/2018, 12:38 PM

## 2018-05-14 NOTE — Progress Notes (Signed)
Physical Therapy Treatment Patient Details Name: [name] [name] MRN: [id number] DOB: 09/01/1970 Today's Date: 05/14/2018    History of Present Illness 48 yo male s/p R DA-THA on 05/13/18. PMH includes OA, thoracic aortic aneurysm, thyroid cancer 2016 with thyroidectomy, dysphagia, aortic valve replacement 2016, HTN, L DA-THA.    PT Comments    Pt limited by R quad and hip pain this session. Pt ambulated short distance, but performed LE exercises and stairs proficiently. Pt highly motivated to d/c tomorrow am. PT to continue to follow acutely.    Follow Up Recommendations  Follow surgeon's recommendation for DC plan and follow-up therapies;Supervision for mobility/OOB(HHPT)     Equipment Recommendations  3in1 (PT)    Recommendations for Other Services       Precautions / Restrictions Precautions Precautions: Fall Restrictions Weight Bearing Restrictions: No Other Position/Activity Restrictions: WBAT     Mobility  Bed Mobility Overal bed mobility: Needs Assistance Bed Mobility: Supine to Sit;Sit to Supine     Supine to sit: Min assist;HOB elevated Sit to supine: Min guard   General bed mobility comments: Min assist for supine to sit for RLE elevation, min guard for sit to supine as pt used gait belt to lift RLE into bed. Pt with increased time to return to bed.   Transfers Overall transfer level: Needs assistance Equipment used: Rolling walker (2 wheeled) Transfers: Sit to/from Stand Sit to Stand: Supervision         General transfer comment: Supervision for safety. PT provided VC for hand placement.   Ambulation/Gait Ambulation/Gait assistance: Min guard Gait Distance (Feet): 25 Feet Assistive device: Rolling walker (2 wheeled) Gait Pattern/deviations: Step-through pattern;Decreased stride length;Antalgic;Decreased stance time - right;Decreased weight shift to right Gait velocity: decr    General Gait Details: Min guard for safety. Pt ambulated to and from step,  pt with increased soreness along R quadriceps which limited tolerance to ambulation.    Stairs Stairs: Yes Stairs assistance: Min guard;+2 safety/equipment Stair Management: No rails;Step to pattern;Forwards;With walker Number of Stairs: 1(2x 1 step, ascending and descending) General stair comments: Min guard +2 for safety, wife assisting with steadying RW. Verbal cuing for sequencing ("up with the good, down with the bad")   Wheelchair Mobility    Modified Rankin (Stroke Patients Only)       Balance Overall balance assessment: Mild deficits observed, not formally tested                                          Cognition Arousal/Alertness: Awake/alert Behavior During Therapy: WFL for tasks assessed/performed Overall Cognitive Status: Within Functional Limits for tasks assessed                                        Exercises Total Joint Exercises Short Arc QuadSinclair Ship;Right;10 reps;Supine Hip ABduction/ADduction: AROM;Right;Standing;5 reps Knee Flexion: AROM;Right;10 reps;Standing Marching in Standing: AROM;Right;5 reps;Standing    General Comments General comments (skin integrity, edema, etc.): pt spouse present and supportive during session      Pertinent Vitals/Pain Pain Assessment: 0-10 Pain Score: 3  Faces Pain Scale: Hurts even more Pain Location: R hip, R thigh Pain Descriptors / Indicators: Sore;Aching Pain Intervention(s): Limited activity within patient's tolerance;Repositioned;Monitored during session;Premedicated before session    Home Living Family/patient expects to be discharged to:: Private residence  Living Arrangements: Spouse/significant other Available Help at Discharge: Family;Available PRN/intermittently(son will be helping the first week; wife works ) Type of Home: House Home Access: Stairs to enter Entrance Stairs-Rails: None Home Layout: Two level;Able to live on main level with bedroom/bathroom Home  Equipment: Gilford Rile - 2 wheels;Cane - single point;Bedside commode      Prior Function Level of Independence: Independent          PT Goals (current goals can now be found in the care plan section) Acute Rehab PT Goals Patient Stated Goal: less pain, return to independence PT Goal Formulation: With patient Time For Goal Achievement: 05/20/18 Potential to Achieve Goals: Good Progress towards PT goals: Progressing toward goals    Frequency    7X/week      PT Plan Current plan remains appropriate    Co-evaluation              AM-PAC PT "6 Clicks" Mobility   Outcome Measure  Help needed turning from your back to your side while in a flat bed without using bedrails?: A Little Help needed moving from lying on your back to sitting on the side of a flat bed without using bedrails?: A Little Help needed moving to and from a bed to a chair (including a wheelchair)?: A Little Help needed standing up from a chair using your arms (e.g., wheelchair or bedside chair)?: A Little Help needed to walk in hospital room?: A Little Help needed climbing 3-5 steps with a railing? : A Little 6 Click Score: 18    End of Session Equipment Utilized During Treatment: Gait belt Activity Tolerance: Patient tolerated treatment well;Patient limited by pain Patient left: with call bell/phone within reach;with family/visitor present;in bed;with SCD's reapplied Nurse Communication: Mobility status PT Visit Diagnosis: Other abnormalities of gait and mobility (R26.89);Difficulty in walking, not elsewhere classified (R26.2)     Time: 1610-9604 PT Time Calculation (min) (ACUTE ONLY): 25 min  Charges:  $Therapeutic Exercise: 8-22 mins $Therapeutic Activity: 8-22 mins                     Lutricia Widjaja Conception Chancy, PT Acute Rehabilitation Services Pager [phone]  Office [phone]  Trindon Dorton D Elonda Husky 05/14/2018, 4:49 PM

## 2018-05-15 LAB — PROTIME-INR
INR: 1.2
Prothrombin Time: 15.1 seconds (ref 11.4–15.2)

## 2018-05-15 MED ORDER — CHLORPROMAZINE HCL 25 MG PO TABS
25.0000 mg | ORAL_TABLET | Freq: Three times a day (TID) | ORAL | Status: DC | PRN
  Administered 2018-05-15: 25 mg via ORAL
  Filled 2018-05-15 (×2): qty 1

## 2018-05-15 MED ORDER — METHOCARBAMOL 500 MG PO TABS: 500.0000 mg | ORAL_TABLET | Freq: Three times a day (TID) | ORAL | 0 refills | Status: DC

## 2018-05-15 MED ORDER — CHLORPROMAZINE HCL 25 MG PO TABS: 25.0000 mg | ORAL_TABLET | Freq: Three times a day (TID) | ORAL | 0 refills | Status: AC | PRN

## 2018-05-15 MED ORDER — WARFARIN SODIUM 5 MG PO TABS: 15.0000 mg | ORAL_TABLET | Freq: Once | ORAL | Status: DC

## 2018-05-15 MED ORDER — SODIUM CHLORIDE 0.9 % IV SOLN
25.0000 mg | Freq: Four times a day (QID) | INTRAVENOUS | Status: DC | PRN
  Filled 2018-05-15 (×2): qty 1

## 2018-05-15 MED ORDER — SODIUM CHLORIDE 0.9 % IV SOLN
50.0000 mg | Freq: Once | INTRAVENOUS | Status: AC
  Administered 2018-05-15: 50 mg via INTRAVENOUS
  Filled 2018-05-15: qty 2

## 2018-05-15 NOTE — Discharge Summary (Signed)
Discharge Diagnoses:  Principal Problem:   Unilateral primary osteoarthritis, right hip Active Problems:   Status post total replacement of right hip   Surgeries: Procedure(s): RIGHT TOTAL HIP ARTHROPLASTY ANTERIOR APPROACH on 05/13/2018    Consultants:   Discharged Condition: Improved  Hospital Course: [name] [name] is an 48 y.o. male who was admitted 05/13/2018 with a chief complaint of osteoarthritis right hip, with a final diagnosis of osteoarthritis right hip.  Patient was brought to the operating room on 05/13/2018 and underwent Procedure(s): RIGHT TOTAL HIP ARTHROPLASTY ANTERIOR APPROACH.    Patient was given perioperative antibiotics:  Anti-infectives (From admission, onward)   Start     Dose/Rate Route Frequency Ordered Stop   05/13/18 1630  ceFAZolin (ANCEF) IVPB 1 g/50 mL premix     1 g 100 mL/hr over 30 Minutes Intravenous Every 6 hours 05/13/18 1331 05/13/18 2300   05/13/18 0745  ceFAZolin (ANCEF) IVPB 2g/100 mL premix     2 g 200 mL/hr over 30 Minutes Intravenous On call to O.R. 05/13/18 5621 05/13/18 1030    .  Patient was given sequential compression devices, early ambulation, and aspirin for DVT prophylaxis.  Recent vital signs:  Patient Vitals for the past 24 hrs:  BP Temp Temp src Pulse Resp SpO2  05/15/18 1419 122/72 98.2 F (36.8 C) Oral (!) 116 15 97 %  05/15/18 0510 113/73 99 F (37.2 C) Oral 96 - 94 %  05/14/18 2036 126/81 98.9 F (37.2 C) Oral 93 18 100 %  05/14/18 1745 118/72 99.1 F (37.3 C) Oral 83 16 99 %  .  Recent laboratory studies: No results found.  Discharge Medications:   Allergies as of 05/15/2018   No Known Allergies     Medication List    TAKE these medications   albuterol 108 (90 Base) MCG/ACT inhaler Commonly known as:  PROVENTIL HFA;VENTOLIN HFA Inhale 2 puffs into the lungs every 6 (six) hours as needed for wheezing or shortness of breath.   aspirin 81 MG EC tablet Take 1 tablet (81 mg total) by mouth daily.   CALCIUM  CARBONATE PO Take 1,200 mg by mouth 2 (two) times daily.   cetirizine 10 MG tablet Commonly known as:  ZYRTEC Take 10 mg by mouth daily.   chlorproMAZINE 25 MG tablet Commonly known as:  THORAZINE Take 1 tablet (25 mg total) by mouth 3 (three) times daily as needed for hiccoughs.   ezetimibe 10 MG tablet Commonly known as:  ZETIA Take 10 mg by mouth at bedtime.   levothyroxine 112 MCG tablet Commonly known as:  SYNTHROID, LEVOTHROID Take 224 mcg by mouth daily before breakfast.   lisinopril 5 MG tablet Commonly known as:  PRINIVIL,ZESTRIL Take 5 mg by mouth daily.   methocarbamol 500 MG tablet Commonly known as:  ROBAXIN Take 1 tablet (500 mg total) by mouth every 6 (six) hours as needed for muscle spasms. What changed:  Another medication with the same name was added. Make sure you understand how and when to take each.   methocarbamol 500 MG tablet Commonly known as:  ROBAXIN Take 1 tablet (500 mg total) by mouth 3 (three) times daily. What changed:  You were already taking a medication with the same name, and this prescription was added. Make sure you understand how and when to take each.   oxyCODONE 5 MG immediate release tablet Commonly known as:  Oxy IR/ROXICODONE Take 1-2 tablets (5-10 mg total) by mouth every 4 (four) hours as needed for moderate pain (  pain score 4-6).   rosuvastatin 10 MG tablet Commonly known as:  CRESTOR Take 10 mg by mouth every Thursday.   warfarin 1 MG tablet Commonly known as:  COUMADIN Take 1 mg by mouth See admin instructions. Take 1 mg by mouth on Tuesday and Thursday in addition to 10 mg tablet   warfarin 10 MG tablet Commonly known as:  COUMADIN Take 10 mg by mouth at bedtime.            Durable Medical Equipment  (From admission, onward)         Start     Ordered   05/13/18 1331  DME 3 n 1  Once     05/13/18 1331   05/13/18 1331  DME Walker rolling  Once    Question:  Patient needs a walker to treat with the following  condition  Answer:  Status post total replacement of right hip   05/13/18 1331          Diagnostic Studies: Dg Pelvis Portable  Result Date: 05/13/2018 CLINICAL DATA:  48 year old male with a history right hip arthroplasty EXAM: PORTABLE PELVIS 1-2 VIEWS COMPARISON:  None. FINDINGS: Surgical changes of right hip arthroplasty with gas in the surgical bed, surgical staples in the soft tissues, and maintain congruence of the components. Previous left hip arthroplasty, unremarkable. Urinary catheter in position. IMPRESSION: Early surgical changes of right hip arthroplasty with no complicating features. Electronically Signed   By: Corrie Mckusick D.O.   On: 05/13/2018 12:46   Dg C-arm 1-60 Min-no Report  Result Date: 05/13/2018 Fluoroscopy was utilized by the requesting physician.  No radiographic interpretation.   Dg Hip Operative Unilat W Or W/o Pelvis Right  Result Date: 05/13/2018 CLINICAL DATA:  Right hip replacement EXAM: OPERATIVE RIGHT HIP WITH PELVIS COMPARISON:  None. FLUOROSCOPY TIME:  Radiation Exposure Index (as provided by the fluoroscopic device): 1.04 mGy If the device does not provide the exposure index: Fluoroscopy Time:  18 seconds Number of Acquired Images:  3 FINDINGS: Right hip replacement is noted in satisfactory position. Previously placed left hip prosthesis is noted as well. IMPRESSION: Status post right hip replacement. Electronically Signed   By: Inez Catalina M.D.   On: 05/13/2018 11:59    Patient benefited maximally from their hospital stay and there were no complications.     Disposition: Discharge disposition: 01-Home or Self Care      Discharge Instructions    Call MD / Call 911   Complete by:  As directed    If you experience chest pain or shortness of breath, CALL 911 and be transported to the hospital emergency room.  If you develope a fever above 101 F, pus (white drainage) or increased drainage or redness at the wound, or calf pain, call your surgeon's  office.   Constipation Prevention   Complete by:  As directed    Drink plenty of fluids.  Prune juice may be helpful.  You may use a stool softener, such as Colace (over the counter) 100 mg twice a day.  Use MiraLax (over the counter) for constipation as needed.   Diet - low sodium heart healthy   Complete by:  As directed    Increase activity slowly as tolerated   Complete by:  As directed      Follow-up Information    Mcarthur Rossetti, MD Follow up in 2 week(s).   Specialty:  Orthopedic Surgery Contact information: [place] [address] [phone]  Home, [facility] Follow up.   Specialty:  [place] Why:  [facility] will call to arrange initial appointment Contact information: [place] [address] [phone]            Signed: Newt Minion 05/15/2018, 4:00 PM

## 2018-05-15 NOTE — Progress Notes (Addendum)
Occupational Therapy Treatment Patient Details Name: [name] [name] MRN: [id number] DOB: Sep 11, 1970 Today's Date: 05/15/2018    History of present illness 48 yo male s/p R DA-THA on 05/13/18. PMH includes OA, thoracic aortic aneurysm, thyroid cancer 2016 with thyroidectomy, dysphagia, aortic valve replacement 2016, HTN, L DA-THA.   OT comments  Pt progressing toward stated goals, increased fatigue this date after finisihing with PT session. Wife present for session to help intake information on DME/AE. BSC delivered to room, provided handout and instruction on uses of BSC as shower seat. Pt and wife in understanding. Verbally reviewed LB AE, pt in understanding- further educated where to buy. Pt difficulty keeping eyes open, noted INR not therapeutic, did not mobilize past chair. Will continue to follow as acute.  Follow Up Recommendations  Follow surgeon's recommendation for DC plan and follow-up therapies;Supervision/Assistance - 24 hour(24 hr initially)    Equipment Recommendations  3 in 1 bedside commode    Recommendations for Other Services      Precautions / Restrictions Precautions Precautions: Fall Restrictions Weight Bearing Restrictions: No Other Position/Activity Restrictions: WBAT        Mobility Bed Mobility               General bed mobility comments: up in recliner  Transfers Overall transfer level: Needs assistance Equipment used: Rolling walker (2 wheeled) Transfers: Sit to/from Stand Sit to Stand: Supervision         General transfer comment: Supervision for safety. PT provided VC for hand placement.     Balance Overall balance assessment: Modified Independent                                         ADL either performed or assessed with clinical judgement   ADL Overall ADL's : Needs assistance/impaired                 Upper Body Dressing : Set up;Min guard;Sitting   Lower Body Dressing: Minimal assistance;Sit to/from  stand Lower Body Dressing Details (indicate cue type and reason): to don pants               General ADL Comments: pt with increased fatigue after PT session this date. Reviewed BSC usage in shower, over toilet, and LB ADL equipment. WIfe present and helping recall information during session, handouts given to ensure carryover     Vision Patient Visual Report: No change from baseline     Perception     Praxis      Cognition Arousal/Alertness: Awake/alert Behavior During Therapy: WFL for tasks assessed/performed Overall Cognitive Status: Within Functional Limits for tasks assessed                                          Exercises Total Joint Exercises Ankle Circles/Pumps: AROM;Both;20 reps;Supine Heel Slides: AAROM;Right;Supine;10 reps Hip ABduction/ADduction: AROM;Right;Standing;10 reps   Shoulder Instructions       General Comments      Pertinent Vitals/ Pain       Pain Assessment: Faces Pain Score: 4  Faces Pain Scale: Hurts even more Pain Location: R hip, R thigh Pain Descriptors / Indicators: Sore;Aching Pain Intervention(s): Limited activity within patient's tolerance;Monitored during session;Premedicated before session  Home Living  Prior Functioning/Environment              Frequency  Min 2X/week        Progress Toward Goals  OT Goals(current goals can now be found in the care plan section)  Progress towards OT goals: Progressing toward goals  Acute Rehab OT Goals Patient Stated Goal: to go home OT Goal Formulation: With patient Time For Goal Achievement: 05/28/18 Potential to Achieve Goals: Good  Plan Discharge plan remains appropriate;Frequency remains appropriate    Co-evaluation                 AM-PAC OT "6 Clicks" Daily Activity     Outcome Measure   Help from another person eating meals?: None Help from another person taking care of personal  grooming?: None Help from another person toileting, which includes using toliet, bedpan, or urinal?: A Little Help from another person bathing (including washing, rinsing, drying)?: A Little Help from another person to put on and taking off regular upper body clothing?: None Help from another person to put on and taking off regular lower body clothing?: A Little 6 Click Score: 21    End of Session    OT Visit Diagnosis: Other abnormalities of gait and mobility (R26.89);Pain Pain - Right/Left: Right Pain - part of body: Hip   Activity Tolerance Patient tolerated treatment well   Patient Left in chair;with call bell/phone within reach;with family/visitor present   Nurse Communication          Time: 0223-3612 OT Time Calculation (min): 8 min  Charges: OT General Charges $OT Visit: 1 Visit  Zenovia Jarred, MSOT, OTR/L Behavioral Health OT/ Acute Relief OT [facility] Office: [place] 05/15/2018, 10:29 AM

## 2018-05-15 NOTE — Progress Notes (Signed)
ANTICOAGULATION CONSULT NOTE  Pharmacy Consult for warfarin Indication: History of aortic valve replacement   No Known Allergies  Patient Measurements: Height: 5\' 9"  (175.3 cm) Weight: 204 lb 4 oz (92.6 kg) IBW/kg (Calculated) : 70.7  Vital Signs: Temp: 99 F (37.2 C) (01/05 0510) Temp Source: Oral (01/05 0510) BP: 113/73 (01/05 0510) Pulse Rate: 96 (01/05 0510)  Labs: Recent Labs    05/13/18 1410 05/14/18 0542 05/15/18 0405  HGB 14.2 13.0  --   HCT 43.6 40.6  --   PLT 241 225  --   LABPROT  --  13.4 15.1  INR  --  1.03 1.20  CREATININE 1.06 0.87  --     Estimated Creatinine Clearance: 118 mL/min (by C-G formula based on SCr of 0.87 mg/dL).  Medications:  Scheduled:  . aspirin EC  81 mg Oral Daily  . docusate sodium  100 mg Oral BID  . enoxaparin (LOVENOX) injection  100 mg Subcutaneous Q12H  . ezetimibe  10 mg Oral QHS  . levothyroxine  224 mcg Oral QAC breakfast  . pantoprazole  40 mg Oral Daily  . [START ON 05/19/2018] rosuvastatin  10 mg Oral Q Thu  . Warfarin - Pharmacist Dosing Inpatient   Does not apply q1800    Assessment: Pharmacy is consulted to dose warfarin in 48 yo male with PMH of aortic valve replacement now s/p R THA on 1/3. Seen at [facility] Coumadin clinic, who made recommendations for Lovenox bridging. Therapeutic Lovenox resumed POD1   Pt home regimen is warfarin 10 mg daily except 11 mg daily on Tuesday and Thursday.  Today, 05/15/2018:  CBC: WNL  INR remains subtherapeutic but trending up appropriately  Major drug interactions: none, also on ASA 81 mg from home and Lovenox 100 mg q12 hr per bridging instructions from Coumadin clinic  No bleeding issues per nursing  Meal intake not charted today  Goal of Therapy: INR 2.5-3.5 Monitor platelets by anticoagulation protocol: Yes   Plan:   Repeat warfarin 15 mg PO x1 today  Continue Lovenox bridging as instructed - Please clarify when bridging may be stopped as current order mentions  INR >/= 2.0 but patient's goal is 2.5-3.5  Daily INR, CBC q72 hr while on anticoagulation  Monitor for signs and symptoms of bleeding or clot   Reuel Boom, PharmD, BCPS [phone] 05/15/2018, 11:43 AM

## 2018-05-15 NOTE — Progress Notes (Signed)
Physical Therapy Treatment Patient Details Name: [name] [name] MRN: [id number] DOB: 11-30-1970 Today's Date: 05/15/2018    History of Present Illness 48 yo male s/p R DA-THA on 05/13/18. PMH includes OA, thoracic aortic aneurysm, thyroid cancer 2016 with thyroidectomy, dysphagia, aortic valve replacement 2016, HTN, L DA-THA.    PT Comments    Pt reports feeling exhausted 2* poor sleep from continuous hiccoughs.  He ambulated 150' with RW, no loss of balance. He demonstrates good understanding of HEP. From a PT standpoint, he is ready to DC home. Noted INR is not therapeutic today.    Follow Up Recommendations  Follow surgeon's recommendation for DC plan and follow-up therapies;Supervision for mobility/OOB(HHPT)     Equipment Recommendations  3in1 (PT)    Recommendations for Other Services       Precautions / Restrictions Precautions Precautions: Fall Restrictions Weight Bearing Restrictions: No Other Position/Activity Restrictions: WBAT     Mobility  Bed Mobility               General bed mobility comments: up in recliner  Transfers Overall transfer level: Needs assistance Equipment used: Rolling walker (2 wheeled) Transfers: Sit to/from Stand Sit to Stand: Supervision         General transfer comment: Supervision for safety. PT provided VC for hand placement.   Ambulation/Gait Ambulation/Gait assistance: Min guard Gait Distance (Feet): 150 Feet Assistive device: Rolling walker (2 wheeled) Gait Pattern/deviations: Step-through pattern;Decreased stride length;Antalgic;Decreased stance time - right;Decreased weight shift to right Gait velocity: decr    General Gait Details: decr velocity, pt stated he's exhausted from lack of sleep 2* ongoing hiccoughs, no loss of balance, good sequencing   Stairs             Wheelchair Mobility    Modified Rankin (Stroke Patients Only)       Balance Overall balance assessment: Modified Independent                                           Cognition Arousal/Alertness: Awake/alert Behavior During Therapy: WFL for tasks assessed/performed Overall Cognitive Status: Within Functional Limits for tasks assessed                                        Exercises Total Joint Exercises Ankle Circles/Pumps: AROM;Both;20 reps;Supine Heel Slides: AAROM;Right;Supine;10 reps Hip ABduction/ADduction: AROM;Right;Standing;10 reps    General Comments        Pertinent Vitals/Pain Pain Assessment: Faces Pain Score: 4  Faces Pain Scale: Hurts even more Pain Location: R hip, R thigh Pain Descriptors / Indicators: Sore;Aching Pain Intervention(s): Limited activity within patient's tolerance;Monitored during session;Premedicated before session    Home Living                      Prior Function            PT Goals (current goals can now be found in the care plan section) Acute Rehab PT Goals Patient Stated Goal: ride horses PT Goal Formulation: With patient/family Time For Goal Achievement: 05/20/18 Potential to Achieve Goals: Good Progress towards PT goals: Progressing toward goals    Frequency    7X/week      PT Plan Current plan remains appropriate    Co-evaluation  AM-PAC PT "6 Clicks" Mobility   Outcome Measure  Help needed turning from your back to your side while in a flat bed without using bedrails?: A Little Help needed moving from lying on your back to sitting on the side of a flat bed without using bedrails?: A Little Help needed moving to and from a bed to a chair (including a wheelchair)?: A Little Help needed standing up from a chair using your arms (e.g., wheelchair or bedside chair)?: A Little Help needed to walk in hospital room?: A Little Help needed climbing 3-5 steps with a railing? : A Little 6 Click Score: 18    End of Session Equipment Utilized During Treatment: Gait belt Activity Tolerance: Patient  tolerated treatment well;Patient limited by pain Patient left: with call bell/phone within reach;with family/visitor present;in chair Nurse Communication: Mobility status PT Visit Diagnosis: Other abnormalities of gait and mobility (R26.89);Difficulty in walking, not elsewhere classified (R26.2)     Time: 2355-7322 PT Time Calculation (min) (ACUTE ONLY): 33 min  Charges:  $Gait Training: 8-22 mins $Therapeutic Exercise: 8-22 mins                    [name] [name] PT 05/15/2018  Acute Rehabilitation Services Pager [phone] Office [phone]

## 2018-05-16 ENCOUNTER — Encounter (HOSPITAL_COMMUNITY): Payer: Self-pay | Admitting: Orthopaedic Surgery

## 2018-05-17 ENCOUNTER — Telehealth (INDEPENDENT_AMBULATORY_CARE_PROVIDER_SITE_OTHER): Payer: Self-pay | Admitting: Orthopaedic Surgery

## 2018-05-17 NOTE — Telephone Encounter (Signed)
Gerald Stabs, PT, from [facility] left a message requesting VO for [facility] PT for the following:  1x a week for 3 weeks 1x a week for 2 weeks  CB#[phone].  Thank you.

## 2018-05-17 NOTE — Telephone Encounter (Signed)
Verbal orders given to Chris 

## 2018-05-19 ENCOUNTER — Ambulatory Visit (INDEPENDENT_AMBULATORY_CARE_PROVIDER_SITE_OTHER): Payer: BLUE CROSS/BLUE SHIELD | Admitting: Physician Assistant

## 2018-05-19 ENCOUNTER — Encounter (INDEPENDENT_AMBULATORY_CARE_PROVIDER_SITE_OTHER): Payer: Self-pay | Admitting: Physician Assistant

## 2018-05-19 ENCOUNTER — Ambulatory Visit (HOSPITAL_COMMUNITY)
Admission: RE | Admit: 2018-05-19 | Discharge: 2018-05-19 | Disposition: A | Discharge status: Home / Self Care | Payer: BLUE CROSS/BLUE SHIELD | Source: Ambulatory Visit | Attending: Physician Assistant | Admitting: Physician Assistant

## 2018-05-19 DIAGNOSIS — Z96641 Presence of right artificial hip joint: Secondary | ICD-10-CM

## 2018-05-19 DIAGNOSIS — M7989 Other specified soft tissue disorders: Secondary | ICD-10-CM

## 2018-05-19 MED ORDER — OSELTAMIVIR PHOSPHATE 75 MG PO CAPS: 75.0000 mg | ORAL_CAPSULE | Freq: Every day | ORAL | 0 refills | Status: DC

## 2018-05-19 MED ORDER — OXYCODONE HCL 5 MG PO TABS: 5.0000 mg | ORAL_TABLET | ORAL | 0 refills | Status: DC | PRN

## 2018-05-19 NOTE — Progress Notes (Signed)
HPI: [name] comes in today to 6 days status post right total hip arthroplasty.  He has noticed some swelling on Tuesday in his right leg and hip.  He is having increased pain.  He has had no shortness of breath no chest pain.  He is on chronic Coumadin has been bridged with Lovenox.  His most recent INR which was done yesterday is 3.2.  Has had no nosebleeds or gum bleeding.  No fevers or chills.  Physical exam: Right hip surgical incisions well approximated with staples no signs of infection.  There is no dehiscence of the wound.  No erythema about the hip.  He does have edema in starting the distal portion of the right thigh which continues throughout the right lower leg.  Calf supple non-tender.  Limited range of motion of the right hip due to pain.  Impression: Status post right total hip arthroplasty 6 days Lower leg edema  Plan: Discussed with patient and his family was present today that did not feel that this is any type of infection.  Most likely this is due to the multiple anticoagulants he is on which are aspirin Lovenox and Coumadin.  He stopped his Lovenox at this point.  However we will still obtain a Doppler to rule out DVT of his right lower leg given extensive amount of swelling.  He is given a prescription for compression hose but should be thigh high.  Elevation wiggling right lower extremity encouraged.  Also encouraged him to continue to work on gait balance and for ambulation to be his primary exercise.  He also notes that he was exposed to his son who now has the flu and is asking for an antiviral and was given Tamiflu for 7 days is to discuss with his primary care physician about getting flu shot as he has not been getting this as of yet.  We will see him back at his regularly scheduled appointment sooner if there is any questions or concerns.

## 2018-05-19 NOTE — Progress Notes (Signed)
Right lower extremity venous duplex exam completed. Please see preliminary notes on CV PROC under chart review. Result attempted to called the ordering physician's office, no answers. Patient went back home. Devantae Babe H Aleli Navedo(RDMS RVT) 05/19/18 3:35 PM

## 2018-05-23 ENCOUNTER — Telehealth (INDEPENDENT_AMBULATORY_CARE_PROVIDER_SITE_OTHER): Payer: Self-pay | Admitting: Orthopaedic Surgery

## 2018-05-23 NOTE — Telephone Encounter (Signed)
Patient called needing Rx refilled (Oxycodone 5 mg tabs) The number to contact patient  Is [phone]

## 2018-05-23 NOTE — Telephone Encounter (Signed)
Please advise 

## 2018-05-24 NOTE — Telephone Encounter (Signed)
They will not fill it until the 16th but can fill it then

## 2018-05-24 NOTE — Telephone Encounter (Signed)
Can you send it through so they will have the RX?

## 2018-05-25 ENCOUNTER — Other Ambulatory Visit (INDEPENDENT_AMBULATORY_CARE_PROVIDER_SITE_OTHER): Payer: Self-pay | Admitting: Physician Assistant

## 2018-05-25 MED ORDER — OXYCODONE HCL 5 MG PO TABS: 5.0000 mg | ORAL_TABLET | ORAL | 0 refills | Status: DC | PRN

## 2018-05-25 NOTE — Telephone Encounter (Signed)
done

## 2018-05-26 ENCOUNTER — Encounter (INDEPENDENT_AMBULATORY_CARE_PROVIDER_SITE_OTHER): Payer: Self-pay | Admitting: Orthopaedic Surgery

## 2018-05-26 ENCOUNTER — Ambulatory Visit (INDEPENDENT_AMBULATORY_CARE_PROVIDER_SITE_OTHER): Payer: BLUE CROSS/BLUE SHIELD | Admitting: Orthopaedic Surgery

## 2018-05-26 ENCOUNTER — Inpatient Hospital Stay (INDEPENDENT_AMBULATORY_CARE_PROVIDER_SITE_OTHER): Payer: BLUE CROSS/BLUE SHIELD | Admitting: Orthopaedic Surgery

## 2018-05-26 DIAGNOSIS — Z96641 Presence of right artificial hip joint: Secondary | ICD-10-CM

## 2018-05-26 MED ORDER — OXYCODONE HCL 5 MG PO TABS: 5.0000 mg | ORAL_TABLET | ORAL | 0 refills | Status: AC | PRN

## 2018-05-26 NOTE — Progress Notes (Signed)
The patient is 2 weeks tomorrow status post a right total hip arthroplasty.  We saw him last week due to abundant swelling in his leg concerning for a blood clot study with a DVT screen and ultrasound and this was negative for DVT.  He is back on his regular Coumadin.  He is having no issues.  He feels much better overall.  On exam his incision looks great summer the staples in place Steri-Strips.  His swelling is gone down dramatically.  We had a long and thorough discussion about his activities.  He can drive from my standpoint.  We will see him back in 4 weeks to see how he is doing overall.  I do not want him getting on a course just yet either.  At his next visit he does not need x-rays.

## 2018-05-30 ENCOUNTER — Other Ambulatory Visit (HOSPITAL_COMMUNITY): Payer: Self-pay | Admitting: Orthopaedic Surgery

## 2018-05-30 NOTE — Telephone Encounter (Signed)
Please advise 

## 2018-06-10 ENCOUNTER — Other Ambulatory Visit (HOSPITAL_COMMUNITY): Payer: Self-pay | Admitting: Orthopaedic Surgery

## 2018-06-10 NOTE — Telephone Encounter (Signed)
Please advise 

## 2018-06-15 ENCOUNTER — Telehealth (INDEPENDENT_AMBULATORY_CARE_PROVIDER_SITE_OTHER): Payer: Self-pay | Admitting: Orthopaedic Surgery

## 2018-06-15 NOTE — Telephone Encounter (Signed)
Tanzania from [employer] called requesting an updated treatment plan for the patient.  CB#[phone], ext [phone].  Thank you.

## 2018-06-16 NOTE — Telephone Encounter (Signed)
They are aware patient still out of work

## 2018-06-22 ENCOUNTER — Ambulatory Visit (INDEPENDENT_AMBULATORY_CARE_PROVIDER_SITE_OTHER): Payer: BLUE CROSS/BLUE SHIELD | Admitting: Orthopaedic Surgery

## 2018-06-22 ENCOUNTER — Encounter (INDEPENDENT_AMBULATORY_CARE_PROVIDER_SITE_OTHER): Payer: Self-pay | Admitting: Orthopaedic Surgery

## 2018-06-22 ENCOUNTER — Ambulatory Visit (INDEPENDENT_AMBULATORY_CARE_PROVIDER_SITE_OTHER): Payer: BLUE CROSS/BLUE SHIELD

## 2018-06-22 DIAGNOSIS — Z96641 Presence of right artificial hip joint: Secondary | ICD-10-CM

## 2018-06-22 DIAGNOSIS — M25561 Pain in right knee: Secondary | ICD-10-CM | POA: Diagnosis not present

## 2018-06-22 MED ORDER — LIDOCAINE HCL 1 % IJ SOLN
3.0000 mL | INTRAMUSCULAR | Status: AC | PRN
  Administered 2018-06-22: 3 mL

## 2018-06-22 MED ORDER — METHYLPREDNISOLONE ACETATE 40 MG/ML IJ SUSP
40.0000 mg | INTRAMUSCULAR | Status: AC | PRN
  Administered 2018-06-22: 40 mg via INTRA_ARTICULAR

## 2018-06-22 NOTE — Progress Notes (Signed)
Office Visit Note   Patient: [name] [name]           Date of Birth: March 01, 1971           MRN: [id number] Visit Date: 06/22/2018              Requested by: Reita Cliche, MD No address on file PCP: Reita Cliche, MD   Assessment & Plan: Visit Diagnoses:  1. Right knee pain, unspecified chronicity   2. Status post total replacement of right hip     Plan: I do feel that his knee pain is mainly related to the surgery on the hip or we did twist the knee around.  He still want a steroid injection since he started work next week and I see no harm in this and I placed a steroid injection in his right knee after explained the risk and benefits injections.  As far as his hips ago we do not need to see him back for 3 months.  At that visit I like a standing low AP pelvis.  All question concerns were answered and addressed.  Follow-Up Instructions: Return in about 3 months (around 09/20/2018).   Orders:  Orders Placed This Encounter  Procedures  . Large Joint Inj  . XR Knee 1-2 Views Right   No orders of the defined types were placed in this encounter.     Procedures: Large Joint Inj: R knee on 06/22/2018 3:21 PM Indications: diagnostic evaluation and pain Details: 22 G 1.5 in needle, superolateral approach  Arthrogram: No  Medications: 3 mL lidocaine 1 %; 40 mg methylPREDNISolone acetate 40 MG/ML Outcome: tolerated well, no immediate complications Procedure, treatment alternatives, risks and benefits explained, specific risks discussed. Consent was given by the patient. Immediately prior to procedure a time out was called to verify the correct patient, procedure, equipment, support staff and site/side marked as required. Patient was prepped and draped in the usual sterile fashion.       Clinical Data: No additional findings.   Subjective: Chief Complaint  Patient presents with  . Right Hip - Routine Post Op  The patient is now 6 weeks status post a right total hip  arthroplasty.  We have already placed his left hip.  He is doing great in terms of his hip goes.  He has been having some right knee pain postoperatively.  He describes more of a ligamentous type of pain.  There is no locking or catching of the knee.  He denies any swelling.  He understands that this sometimes can be from surgery will retest the need to get hip and.  He says his hips are doing well.  He is using a cane when he gets around.  HPI  Review of Systems He currently denies any headache, chest pain, shortness of breath, fever, chills, nausea, vomiting  Objective: Vital Signs: There were no vitals taken for this visit.  Physical Exam He is alert and orient x3 and in no acute distress Ortho Exam Examination of both hips show they are moving fully.  His more recent right operative hip has decreased swelling.  His right knee has no effusion at all with full range of motion and is ligamentously stable. Specialty Comments:  No specialty comments available.  Imaging: Xr Knee 1-2 Views Right  Result Date: 06/22/2018 2 views of the right knee show no acute findings with well-maintained joint space.  The alignment is well-maintained as well.    PMFS History: Patient Active Problem List  Diagnosis Date Noted  . Status post total replacement of right hip 05/13/2018  . Unilateral primary osteoarthritis, right hip 02/09/2018  . Pain in left hip 11/16/2016  . Vitamin D deficiency 09/10/2015  . Hypocalcemia 08/12/2015  . Thoracic aortic aneurysm ([place]) 08/05/2015  . 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 Osteoarthritis of left hip 05/10/2015  . Status post total replacement of left hip 05/10/2015  . Thoracic aortic aneurysm, without rupture ([place]) 05/01/2015  . Incidental pulmonary nodule, > 61mm and < 59mm left lower lobe 05/01/2015  . H/O bicuspid aortic valve 05/01/2015  . Encounter for therapeutic drug level monitoring 04/29/2015  . HLD (hyperlipidemia) 01/03/2015  . 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 Malignant neoplasm of thyroid gland ([place]) 12/25/2014  . BP (high blood pressure) 12/19/2014  . Thyroid mass 12/19/2014  . Dysphagia 12/19/2014  . Long term current use of anticoagulant 12/03/2014  . H/O aortic valve replacement 12/03/2014  . Presence of prosthetic heart valve 12/03/2014  . Aortic valve stenosis 12/03/2014  . Anticoagulated on warfarin 12/03/2014  . Degenerative arthritis of hip 03/17/2011   Past Medical History:  Diagnosis Date  . Aortic stenosis   . BP (high blood pressure) 12/19/2014   takes Lisinopril and Metoprolol daily  . Cancer ([place])    thyroid  . Complication of anesthesia   . Degenerative arthritis of hip 03/17/2011  . Environmental allergies    Albuterol inhaler as needed  . GERD (gastroesophageal reflux disease)    takes Zantac daily  . H/O aortic valve replacement 12/03/2014  . H/O bicuspid aortic valve 05/01/2015  . History of bronchitis   . HLD (hyperlipidemia)    was on Crestor but has been off for months(oct 2016)  . Hypothyroidism    takes Synthroid daily  . Incidental pulmonary nodule, > 25mm and < 42mm left lower lobe 05/01/2015  . Long term current use of anticoagulant 12/03/2014  . 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 PONV (postoperative nausea and vomiting)    nausea  . S/P thyroidectomy   . Thoracic aortic aneurysm, without rupture ([place]) 05/01/2015  . TIA (transient ischemic attack)   . Vertigo    d//t inner ear    Family History  Problem Relation Age of Onset  . Heart disease Mother        before age 33  . Heart disease Father        before age 67    Past Surgical History:  Procedure Laterality Date  . BENTALL PROCEDURE N/A 08/05/2015   Procedure: REDO BENTALL PROCEDURE with circ arrest using a 32 Hemashield platinum graft. ;  Surgeon: [name], MD;  Location: [place];  Service: Open Heart Surgery;  Laterality: N/A;  POSSIBLE CIRC ARREST  . CARDIAC VALVE SURGERY  at age  53/at age 24   AVReplacement 2003. Dr Berline Lopes [facility]  . TEE WITHOUT CARDIOVERSION N/A 08/05/2015   Procedure: TRANSESOPHAGEAL ECHOCARDIOGRAM (TEE);  Surgeon: [name], MD;  Location: [place];  Service: Open Heart Surgery;  Laterality: N/A;  . THYROIDECTOMY     12/24/2014, Dr Meredith [facility]  . TOTAL HIP ARTHROPLASTY Left 05/10/2015   Procedure: LEFT TOTAL HIP ARTHROPLASTY ANTERIOR APPROACH;  Surgeon: Mcarthur Rossetti, MD;  Location: [facility];  Service: Orthopedics;  Laterality: Left;  . TOTAL HIP ARTHROPLASTY Right 05/13/2018   Procedure: RIGHT TOTAL HIP ARTHROPLASTY ANTERIOR APPROACH;  Surgeon: Mcarthur Rossetti, MD;  Location: [facility];  Service: Orthopedics;  Laterality: Right;  Social History   Occupational History  . Not on file  Tobacco Use  . Smoking status: Never Smoker  . Smokeless tobacco: Never Used  Substance and Sexual Activity  . Alcohol use: No    Alcohol/week: 0.0 standard drinks  . Drug use: No  . Sexual activity: Yes

## 2018-06-23 ENCOUNTER — Telehealth (INDEPENDENT_AMBULATORY_CARE_PROVIDER_SITE_OTHER): Payer: Self-pay | Admitting: Orthopaedic Surgery

## 2018-06-23 NOTE — Telephone Encounter (Signed)
Patient called asked if he can get a note stating he can return back to work without restrictions. Patient asked if the note can be faxed to his employer? The fax# is [phone]   Attn: HR   Patient asked if a copy can be emailed to him   The email address is  [email or web address]    The number to contact patient is [phone]

## 2018-06-23 NOTE — Telephone Encounter (Signed)
Yes.  He is going back this Monday.

## 2018-06-23 NOTE — Telephone Encounter (Signed)
Please advise,

## 2018-06-23 NOTE — Telephone Encounter (Signed)
Tanzania from [employer] called wanting to know if the patient has been released to go back to work.  CB#[phone].  Thank you.

## 2018-06-24 ENCOUNTER — Encounter (INDEPENDENT_AMBULATORY_CARE_PROVIDER_SITE_OTHER): Payer: Self-pay

## 2018-06-24 NOTE — Telephone Encounter (Signed)
Faxed and emailed to provided numbers

## 2018-07-07 ENCOUNTER — Other Ambulatory Visit: Payer: No Typology Code available for payment source

## 2018-07-07 ENCOUNTER — Encounter: Payer: BLUE CROSS/BLUE SHIELD | Admitting: Cardiothoracic Surgery

## 2018-07-27 ENCOUNTER — Other Ambulatory Visit: Payer: Self-pay

## 2018-07-28 ENCOUNTER — Encounter: Payer: BLUE CROSS/BLUE SHIELD | Admitting: Cardiothoracic Surgery

## 2018-07-28 ENCOUNTER — Ambulatory Visit
Admission: RE | Admit: 2018-07-28 | Discharge: 2018-07-28 | Disposition: A | Discharge status: Home / Self Care | Payer: BLUE CROSS/BLUE SHIELD | Source: Ambulatory Visit | Attending: Cardiothoracic Surgery | Admitting: Cardiothoracic Surgery

## 2018-07-28 ENCOUNTER — Telehealth: Payer: Self-pay | Admitting: Cardiothoracic Surgery

## 2018-07-28 DIAGNOSIS — I712 Thoracic aortic aneurysm, without rupture, unspecified: Secondary | ICD-10-CM

## 2018-07-28 MED ORDER — IOPAMIDOL (ISOVUE-370) INJECTION 76%
75.0000 mL | Freq: Once | INTRAVENOUS | Status: AC | PRN
  Administered 2018-07-28: 75 mL via INTRAVENOUS

## 2018-07-28 NOTE — Telephone Encounter (Signed)
[address]       [place] [address]             [phone]      Lucile Valencia Ivins Medical Record #[id number] Date of Birth: 1971-03-16  Referring: No ref. provider found Primary Care: Reita Cliche, MD Primary Cardiologist: No primary care provider on file.   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 POSTOPERATIVE DIAGNOSIS:  same PROCEDURE:  Redo sternotomy with supra coronary replacement of ascending aorta with 32-mm Hemashield graft with hypothermic circulatory arrest, and right axillary artery cannulation. SURGEON:  Lanelle Bal, M.D.  History of Present Illness:     Patient returned for CT scan but due to CVOID the patient was not seen in the office but a phone call was made to him to review the results of his scan and check verbally for any complaints or other issues.  The patient notes that he has been doing fine return to full-time work denies chest pain or shortness of breath.  He has been diligent about his Coumadin therapy for mechanical valve and his also continued with dental prophylaxis and good dental care.     Past Medical History:  Diagnosis Date  . Aortic stenosis   . BP (high blood pressure) 12/19/2014   takes Lisinopril and Metoprolol daily  . Cancer ([place])    thyroid  . Complication of anesthesia   . Degenerative arthritis of hip 03/17/2011  . Environmental allergies    Albuterol inhaler as needed  . GERD (gastroesophageal reflux disease)    takes Zantac daily  . H/O aortic valve replacement 12/03/2014  . H/O bicuspid aortic valve 05/01/2015  . History of bronchitis   . HLD (hyperlipidemia)    was on Crestor but has been off for months(oct 2016)  . Hypothyroidism    takes Synthroid daily  . Incidental pulmonary nodule, > 40mm and < 66mm left lower lobe 05/01/2015   . Long term current use of anticoagulant 12/03/2014  . 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 PONV (postoperative nausea and vomiting)    nausea  . S/P thyroidectomy   . Thoracic aortic aneurysm, without rupture ([place]) 05/01/2015  . TIA (transient ischemic attack)   . Vertigo    d//t inner ear     Social History   Tobacco Use  Smoking Status Never Smoker  Smokeless Tobacco Never Used    Social History   Substance and Sexual Activity  Alcohol Use No  . Alcohol/week: 0.0 standard drinks     No Known Allergies  Current Outpatient Medications  Medication Sig Dispense Refill  . albuterol (PROVENTIL HFA;VENTOLIN HFA) 108 (90 BASE) MCG/ACT inhaler Inhale 2 puffs into the lungs every 6 (six) hours as needed for wheezing or shortness of breath.     Marland Kitchen aspirin EC 81 MG EC tablet Take 1 tablet (81 mg total) by mouth daily.    Marland Kitchen CALCIUM CARBONATE PO Take 1,200 mg by mouth 2 (two) times daily.     . cetirizine (ZYRTEC) 10 MG tablet Take 10 mg by mouth daily.    . chlorproMAZINE (THORAZINE) 25 MG tablet Take 1 tablet (25 mg total) by mouth 3 (three) times daily as needed for hiccoughs. 20 tablet 0  . ezetimibe (ZETIA) 10 MG tablet Take 10 mg by mouth at bedtime.   0  . levothyroxine (SYNTHROID,  LEVOTHROID) 112 MCG tablet Take 224 mcg by mouth daily before breakfast.    . lisinopril (PRINIVIL,ZESTRIL) 5 MG tablet Take 5 mg by mouth daily.    . methocarbamol (ROBAXIN) 500 MG tablet Take 1 tablet (500 mg total) by mouth 3 (three) times daily. 30 tablet 0  . methocarbamol (ROBAXIN) 500 MG tablet TAKE 1 TABLET (500 MG TOTAL) BY MOUTH EVERY 6 (SIX) HOURS AS NEEDED FOR MUSCLE SPASMS. 60 tablet 0  . oseltamivir (TAMIFLU) 75 MG capsule Take 1 capsule (75 mg total) by mouth daily. 7 capsule 0  . rosuvastatin (CRESTOR) 10 MG tablet Take 10 mg by mouth every Thursday.     . warfarin (COUMADIN) 1 MG tablet Take 1 mg by mouth See admin instructions. Take 1 mg by mouth on Tuesday and Thursday  in addition to 10 mg tablet    . warfarin (COUMADIN) 10 MG tablet Take 10 mg by mouth at bedtime.      No current facility-administered medications for this visit.       Diagnostic Studies & Laboratory data:     Recent Radiology Findings:   Ct Angio Chest Aorta W/cm &/or Wo/cm  Result Date: 07/28/2018 CLINICAL DATA:  History of Bentall procedure EXAM: CT ANGIOGRAPHY CHEST WITH CONTRAST TECHNIQUE: Multidetector CT imaging of the chest was performed using the standard protocol during bolus administration of intravenous contrast. Multiplanar CT image reconstructions and MIPs were obtained to evaluate the vascular anatomy. CONTRAST:  67mL ISOVUE-370 IOPAMIDOL (ISOVUE-370) INJECTION 76% COMPARISON:  CT chest, 07/02/2016 FINDINGS: Cardiovascular: Status post Bentall procedure with unchanged appearance of graft and prosthesis repair of the aortic valve and ascending thoracic aorta. Examination is somewhat limited by cardiac motion however there is no unexpected abnormality of the graft or vessel. Normal heart size. No pericardial effusion. Mediastinum/Nodes: No enlarged mediastinal, hilar, or axillary lymph nodes. Thyroid gland, trachea, and esophagus demonstrate no significant findings. Lungs/Pleura: Stable benign small pulmonary nodules at the left lung, measuring 4 mm or smaller. Pleural effusion or pneumothorax. Upper Abdomen: No acute abnormality. Musculoskeletal: No chest wall abnormality. No acute or significant osseous findings. Review of the MIP images confirms the above findings. IMPRESSION: Status post Bentall procedure with unchanged appearance of graft and prosthesis repair of the aortic valve and ascending thoracic aorta. Examination is somewhat limited by cardiac motion however there is no unexpected abnormality of the graft or vessel. Electronically Signed   By: Eddie Candle M.D.   On: 07/28/2018 09:50   I have independently reviewed the above radiology studies  and reviewed the findings with  the patient.    Recent Lab Findings: Lab Results  Component Value Date   WBC 13.6 (H) 05/14/2018   HGB 13.0 05/14/2018   HCT 40.6 05/14/2018   PLT 225 05/14/2018   GLUCOSE 145 (H) 05/14/2018   ALT 18 08/10/2015   AST 25 08/10/2015   NA 137 05/14/2018   K 4.6 05/14/2018   CL 103 05/14/2018   CREATININE 0.87 05/14/2018   BUN 14 05/14/2018   CO2 23 05/14/2018   INR 1.20 05/15/2018   HGBA1C 5.5 08/01/2015      Assessment / Plan:      Patient had follow-up CT scan now approximately 2 years after replacement of his a sending aorta for dilated aorta previously he had had replacement of a bicuspid aortic valve with mechanical aortic valve.  A CT scan is stable.  Patient is following good practices as far as his mechanical aortic valve.  We will plan  to see him back again in 2 years with a follow-up CTA      [name] MD      [address] [place] [address] [place]   Beeper [phone]  07/28/2018 4:43 PM

## 2018-09-08 ENCOUNTER — Encounter: Payer: BLUE CROSS/BLUE SHIELD | Admitting: Cardiothoracic Surgery

## 2018-09-21 ENCOUNTER — Ambulatory Visit (INDEPENDENT_AMBULATORY_CARE_PROVIDER_SITE_OTHER): Payer: BLUE CROSS/BLUE SHIELD | Admitting: Orthopaedic Surgery

## 2018-11-21 ENCOUNTER — Ambulatory Visit: Payer: BLUE CROSS/BLUE SHIELD | Admitting: Podiatry

## 2018-11-21 ENCOUNTER — Other Ambulatory Visit: Payer: Self-pay

## 2018-11-21 ENCOUNTER — Encounter: Payer: Self-pay | Admitting: Podiatry

## 2018-11-21 ENCOUNTER — Ambulatory Visit: Payer: BC Managed Care – PPO

## 2018-11-21 VITALS — Temp 98.2°F

## 2018-11-21 DIAGNOSIS — M722 Plantar fascial fibromatosis: Secondary | ICD-10-CM

## 2018-11-21 MED ORDER — METHYLPREDNISOLONE 4 MG PO TBPK: ORAL_TABLET | ORAL | 0 refills | Status: DC

## 2018-11-23 NOTE — Progress Notes (Signed)
   Subjective: 48 year old male presenting today for follow up evaluation of plantar fasciitis of the left foot. He states the pain never fully resolved. He states the injection he received at his last visit lasted for about two weeks. He has taken the Medrol Dose Pak that was prescribed as well as used the pain cream. Patient is here for further evaluation and treatment.   Past Medical History:  Diagnosis Date  . Aortic stenosis   . BP (high blood pressure) 12/19/2014   takes Lisinopril and Metoprolol daily  . Cancer ([place])    thyroid  . Complication of anesthesia   . Degenerative arthritis of hip 03/17/2011  . Environmental allergies    Albuterol inhaler as needed  . GERD (gastroesophageal reflux disease)    takes Zantac daily  . H/O aortic valve replacement 12/03/2014  . H/O bicuspid aortic valve 05/01/2015  . History of bronchitis   . HLD (hyperlipidemia)    was on Crestor but has been off for months(oct 2016)  . Hypothyroidism    takes Synthroid daily  . Incidental pulmonary nodule, > 52mm and < 72mm left lower lobe 05/01/2015  . Long term current use of anticoagulant 12/03/2014  . 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 PONV (postoperative nausea and vomiting)    nausea  . S/P thyroidectomy   . Thoracic aortic aneurysm, without rupture ([place]) 05/01/2015  . TIA (transient ischemic attack)   . Vertigo    d//t inner ear     Objective: Physical Exam General: The patient is alert and oriented x3 in no acute distress.  Dermatology: Skin is warm, dry and supple bilateral lower extremities. Negative for open lesions or macerations bilateral.   Vascular: Dorsalis Pedis and Posterior Tibial pulses palpable bilateral.  Capillary fill time is immediate to all digits.  Neurological: Epicritic and protective threshold intact bilateral.   Musculoskeletal: Tenderness to palpation to the plantar aspect of the left heel along the plantar fascia. All other joints range of motion  within normal limits bilateral. Strength 5/5 in all groups bilateral.   Assessment: 1. Plantar fasciitis left foot 2. H/o bilateral hip replacement   Plan of Care:  1. Patient evaluated.  2. Injection of 0.5cc Celestone soluspan injected into the left plantar fascia.  3. Rx for Medrol Dose Pak placed 4. Patient on Coumadin secondary to aortic valve stenosis since childhood. Cannot take NSAIDs.  5. Plantar fascial band dispensed.  6. Night splint dispensed.  7. Return to clinic in 4 weeks. If not better, we will discuss surgery.    Edrick Kins, DPM [facility]  Dr. Edrick Kins, DPM    [address], [place] [address]                Office [phone]  Fax [phone]

## 2019-05-29 ENCOUNTER — Other Ambulatory Visit: Payer: Self-pay

## 2019-05-29 ENCOUNTER — Ambulatory Visit: Payer: BC Managed Care – PPO | Admitting: Podiatry

## 2019-05-29 ENCOUNTER — Ambulatory Visit (INDEPENDENT_AMBULATORY_CARE_PROVIDER_SITE_OTHER): Payer: BC Managed Care – PPO

## 2019-05-29 DIAGNOSIS — M722 Plantar fascial fibromatosis: Secondary | ICD-10-CM | POA: Diagnosis not present

## 2019-06-01 NOTE — Progress Notes (Signed)
   Subjective: 49 year old male presenting today for follow up evaluation of plantar fasciitis of the left foot. He reports constant aching pain of the left plantar midfoot that has been ongoing for the past year. Bearing weight, applying pressure or standing first thing in the morning all increases the pain. He has had injections in the past for treatment with minimal relief. Patient is here for further evaluation and treatment.   Past Medical History:  Diagnosis Date  . Aortic stenosis   . BP (high blood pressure) 12/19/2014   takes Lisinopril and Metoprolol daily  . Cancer ([place])    thyroid  . Complication of anesthesia   . Degenerative arthritis of hip 03/17/2011  . Environmental allergies    Albuterol inhaler as needed  . GERD (gastroesophageal reflux disease)    takes Zantac daily  . H/O aortic valve replacement 12/03/2014  . H/O bicuspid aortic valve 05/01/2015  . History of bronchitis   . HLD (hyperlipidemia)    was on Crestor but has been off for months(oct 2016)  . Hypothyroidism    takes Synthroid daily  . Incidental pulmonary nodule, > 7mm and < 32mm left lower lobe 05/01/2015  . Long term current use of anticoagulant 12/03/2014  . 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 PONV (postoperative nausea and vomiting)    nausea  . S/P thyroidectomy   . Thoracic aortic aneurysm, without rupture ([place]) 05/01/2015  . TIA (transient ischemic attack)   . Vertigo    d//t inner ear     Objective: Physical Exam General: The patient is alert and oriented x3 in no acute distress.  Dermatology: Skin is warm, dry and supple bilateral lower extremities. Negative for open lesions or macerations bilateral.   Vascular: Dorsalis Pedis and Posterior Tibial pulses palpable bilateral.  Capillary fill time is immediate to all digits.  Neurological: Epicritic and protective threshold intact bilateral.   Musculoskeletal: Tenderness to palpation to the plantar aspect of the left heel  along the plantar fascia. All other joints range of motion within normal limits bilateral. Strength 5/5 in all groups bilateral.   Assessment: 1. Plantar fasciitis left foot 2. H/o bilateral hip replacement   Plan of Care:  1. Patient evaluated.  2. Discussed EPAT vs physical therapy vs surgical treatments.  3. Patient opts for EPAT at this time.  4. Patient on Coumadin secondary to aortic valve stenosis since childhood. Cannot take NSAIDs.  5. Appointment with RN for EPAT.  6. Return to clinic as needed.   Has Quarter horses.    Edrick Kins, DPM [facility]  Dr. Edrick Kins, DPM    [address], [place] [address]                Office [phone]  Fax [phone]

## 2019-06-05 ENCOUNTER — Other Ambulatory Visit: Payer: Self-pay

## 2019-06-05 ENCOUNTER — Ambulatory Visit (INDEPENDENT_AMBULATORY_CARE_PROVIDER_SITE_OTHER): Payer: BC Managed Care – PPO | Admitting: *Deleted

## 2019-06-05 DIAGNOSIS — M722 Plantar fascial fibromatosis: Secondary | ICD-10-CM

## 2019-06-05 DIAGNOSIS — M79676 Pain in unspecified toe(s): Secondary | ICD-10-CM

## 2019-06-05 NOTE — Patient Instructions (Signed)

## 2019-06-05 NOTE — Progress Notes (Signed)
Patient presents for the 1st EPAT treatment today with complaint of plantar heel pain left. Diagnosed with plantar fasciitis by Dr. Amalia Hailey. This has been ongoing for several months. The patient has tried ice, stretching, NSAIDS and supportive shoe gear with no long term relief. Most of the pain is located medial and central heel. He relates having the most of the pain in the mornings. He does have a night splint, but has trouble sleeping with it.   ESWT administered and tolerated well. Treatment settings initiated at:   Energy: 25  Ended treatment session today with 3000 shocks at the following settings:   Energy: 25  Frequency: 4.0  Joules: 24.52  Arch massager was utilized x 3 rounds  Advised to avoid ice and NSAIDs and to utilize boot or supportive shoes for at least the next 3 days.  Follow up for 2nd treatment in 1 week.  Patient was dispensed a CAM boot today. He wears mostly cowboy style boots and doesn't really have a supportive sneaker to wear post treatment. Advised he wear the CAM boot as much as possible and to try the night splint in the evenings while stationary prior to bedtime.

## 2019-06-16 ENCOUNTER — Other Ambulatory Visit: Payer: Self-pay

## 2019-06-16 ENCOUNTER — Ambulatory Visit (INDEPENDENT_AMBULATORY_CARE_PROVIDER_SITE_OTHER): Payer: BC Managed Care – PPO | Admitting: *Deleted

## 2019-06-16 DIAGNOSIS — M722 Plantar fascial fibromatosis: Secondary | ICD-10-CM

## 2019-06-16 DIAGNOSIS — M79676 Pain in unspecified toe(s): Secondary | ICD-10-CM

## 2019-06-16 NOTE — Progress Notes (Signed)
Patient presents for the 2nd EPAT treatment today with complaint of plantar heel pain left. Diagnosed with plantar fasciitis by Dr. Amalia Hailey. This has been ongoing for several months. The patient has tried ice, stretching, NSAIDS and supportive shoe gear with no long term relief. Most of the pain is located medial and central heel. He does have a night splint, but has trouble sleeping with it. He is wearing sneakers and his CAM boot today. He did not have any increase in pain after the initial treatment. He is having less pain medially today.  ESWT administered and tolerated well. Treatment settings initiated at:   Energy: 30  Ended treatment session today with 3000 shocks at the following settings:   Energy: 30  Frequency: 4.0  Joules: 29.43  Arch massager was utilized x 3 rounds  Advised to avoid ice and NSAIDs and to utilize boot or supportive shoes for at least the next 3 days.  Follow up for 3rd treatment in 1 week.  We discussed custom orthotics and he is interested in getting some. He wants to wait until he completes EPAT treatments first though.

## 2019-06-23 ENCOUNTER — Other Ambulatory Visit: Payer: Self-pay

## 2019-06-23 ENCOUNTER — Ambulatory Visit (INDEPENDENT_AMBULATORY_CARE_PROVIDER_SITE_OTHER): Payer: BC Managed Care – PPO | Admitting: *Deleted

## 2019-06-23 DIAGNOSIS — M722 Plantar fascial fibromatosis: Secondary | ICD-10-CM

## 2019-06-23 DIAGNOSIS — M79676 Pain in unspecified toe(s): Secondary | ICD-10-CM

## 2019-06-23 NOTE — Progress Notes (Signed)
Patient presents for the 3rd EPAT treatment today with complaint of plantar heel pain left. Diagnosed with plantar fasciitis by Dr. Amalia Hailey. This has been ongoing for several months. The patient has tried ice, stretching, NSAIDS and supportive shoe gear with no long term relief. Most of the pain today is located towards the lateral side of the plantar heel. He does have a night splint, but has trouble sleeping with it. He is wearing his CAM boot today and he says he's probably worn it 75% of the week. He says the pain is about the same as last visit.  ESWT administered and tolerated well. Treatment settings initiated at:   Energy: 35  Ended treatment session today with 3000 shocks at the following settings:   Energy: 35  Frequency: 4.0  Joules: 34.29  Arch massager was utilized x 3 rounds  Advised to avoid ice and NSAIDs and to utilize boot or supportive shoes for at least the next 3 days.  Follow up for 4th treatment in 2 weeks.  Would like to wait until after EPAT treatments to get orthotics.

## 2019-07-07 ENCOUNTER — Ambulatory Visit (INDEPENDENT_AMBULATORY_CARE_PROVIDER_SITE_OTHER): Payer: BC Managed Care – PPO

## 2019-07-07 ENCOUNTER — Other Ambulatory Visit: Payer: Self-pay

## 2019-07-07 DIAGNOSIS — M722 Plantar fascial fibromatosis: Secondary | ICD-10-CM

## 2019-07-07 DIAGNOSIS — M79676 Pain in unspecified toe(s): Secondary | ICD-10-CM

## 2019-07-07 NOTE — Progress Notes (Signed)
Patient presents for the 3rd EPAT treatment today with complaint of plantar heel pain left. Diagnosed with plantar fasciitis by Dr. Amalia Hailey. This has been ongoing for several months. The patient has tried ice, stretching, NSAIDS and supportive shoe gear with no long term relief.   Most of the pain today is located central heel. He says the pain has decreased some. He hasn't worn his CAM boot very much this week.  ESWT administered and tolerated well. Treatment settings initiated at:   Energy: 40  Ended treatment session today with 3000 shocks at the following settings:   Energy: 40  Frequency: 3.0  Joules: 39.02  Arch massager was utilized x 3 rounds  Advised to avoid ice and NSAIDs and to utilize boot or supportive shoes for at least the next 3 days.  He wants to try one more treatment, so I will follow up with him for his 5th treatment in 2 weeks.   Would like to wait until after EPAT treatments to get orthotics.

## 2019-07-21 ENCOUNTER — Ambulatory Visit: Payer: BC Managed Care – PPO

## 2020-05-27 ENCOUNTER — Telehealth: Payer: Self-pay | Admitting: Cardiothoracic Surgery

## 2020-05-27 DIAGNOSIS — I712 Thoracic aortic aneurysm, without rupture, unspecified: Secondary | ICD-10-CM

## 2020-06-21 ENCOUNTER — Other Ambulatory Visit: Payer: Self-pay | Admitting: Cardiothoracic Surgery

## 2020-06-21 DIAGNOSIS — I712 Thoracic aortic aneurysm, without rupture, unspecified: Secondary | ICD-10-CM

## 2020-07-25 ENCOUNTER — Ambulatory Visit: Payer: BLUE CROSS/BLUE SHIELD | Admitting: Cardiothoracic Surgery

## 2020-08-01 ENCOUNTER — Ambulatory Visit: Payer: BC Managed Care – PPO | Admitting: Cardiothoracic Surgery

## 2020-08-01 ENCOUNTER — Other Ambulatory Visit: Payer: Self-pay

## 2020-08-01 ENCOUNTER — Ambulatory Visit
Admission: RE | Admit: 2020-08-01 | Discharge: 2020-08-01 | Disposition: A | Discharge status: Home / Self Care | Payer: BC Managed Care – PPO | Source: Ambulatory Visit | Attending: Cardiothoracic Surgery | Admitting: Cardiothoracic Surgery

## 2020-08-01 VITALS — BP 148/97 | HR 62 | Resp 20 | Ht 69.0 in | Wt 208.0 lb

## 2020-08-01 DIAGNOSIS — I712 Thoracic aortic aneurysm, without rupture, unspecified: Secondary | ICD-10-CM

## 2020-08-01 MED ORDER — IOPAMIDOL (ISOVUE-370) INJECTION 76%
75.0000 mL | Freq: Once | INTRAVENOUS | Status: AC | PRN
  Administered 2020-08-01: 75 mL via INTRAVENOUS

## 2020-08-01 NOTE — Progress Notes (Signed)
[address]       [name] [address]             [phone]      [facility] Medical Record #[id number] Date of Birth: 08/07/1970  Referring: Angelia Mould,* Primary Care: Reita Cliche, MD Primary Cardiologist: Dr.  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 POSTOPERATIVE DIAGNOSIS:  same PROCEDURE:  Redo sternotomy with supra coronary replacement of ascending aorta with 32-mm Hemashield graft with hypothermic circulatory arrest, and right axillary artery cannulation. SURGEON:  Lanelle Bal, M.D.  History of Present Illness:    Patient returns to the office today with a follow-up CTA of the chest after replacement of his ascending aorta August 05, 2015.  Patient notes since last seen 2 years ago his been making good progress.  He has had very little difficulty with the management of his Coumadin or any significant side effects.  He does tell me in the office today that he is now back in touch with Dr. Elonda Husky and is seeing him in [facility].     Patients cardiac history started  at age 66 when he had critical aortic stenosis and underwent open aortic valve commissurotomy in [place]. Subsequently he presented in September 2003 with critical aortic stenosis aortic insufficiency and underwent aortic valve replacement with a #23 [employer] Jude mechanical valve model #23 AGN- 751, serial [id number]. At the time of surgery Dr. Berline Lopes in [facility] described also a repair of the pseudoaneurysm of the ascending aorta. 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 The remainder of the descending aorta was not noted to be enlarged.  The patient has been clinically stable since that time on Coumadin, with the exception of a brief period early postop when he stopped taking his Coumadin and had a minor stroke. 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 In November 2016 CT scan of the chest was performed demonstrating a 5.5 cm dilatation of the ascending aorta. In addition there was a 4 mm left lower lobe lung nodule, too small to characterize.    Past Medical History:  Diagnosis Date  . Aortic stenosis   . BP (high blood pressure) 12/19/2014   takes Lisinopril and Metoprolol daily  . Cancer ([place])    thyroid  . Complication of anesthesia   . Degenerative arthritis of hip 03/17/2011  . Environmental allergies    Albuterol inhaler as needed  . GERD (gastroesophageal reflux disease)    takes Zantac daily  . H/O aortic valve replacement 12/03/2014  . H/O bicuspid aortic valve 05/01/2015  . History of bronchitis   . HLD (hyperlipidemia)    was on Crestor but has been off for months(oct 2016)  . Hypothyroidism    takes Synthroid daily  . Incidental pulmonary nodule, > 14mm and < 63mm left lower lobe 05/01/2015  . Long term current use of anticoagulant 12/03/2014  . 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 PONV (postoperative nausea and vomiting)    nausea  . S/P thyroidectomy   . Thoracic  aortic aneurysm, without rupture ([place]) 05/01/2015  . TIA (transient ischemic attack)   . Vertigo    d//t inner ear     Social History   Tobacco Use  Smoking Status Never Smoker  Smokeless Tobacco Never Used    Social History   Substance and Sexual Activity  Alcohol Use No  . Alcohol/week: 0.0 standard drinks     Allergies  Allergen Reactions  . Codeine Nausea And Vomiting    Current Outpatient Medications  Medication Sig Dispense Refill  . albuterol (PROVENTIL HFA;VENTOLIN HFA) 108 (90 BASE)  MCG/ACT inhaler Inhale 2 puffs into the lungs every 6 (six) hours as needed for wheezing or shortness of breath.     Marland Kitchen aspirin EC 81 MG EC tablet Take 1 tablet (81 mg total) by mouth daily.    Marland Kitchen CALCIUM CARBONATE PO Take 1,200 mg by mouth 2 (two) times daily.     . cetirizine (ZYRTEC) 10 MG tablet Take 10 mg by mouth daily.    . chlorproMAZINE (THORAZINE) 25 MG tablet Take 1 tablet (25 mg total) by mouth 3 (three) times daily as needed for hiccoughs. 20 tablet 0  . ezetimibe (ZETIA) 10 MG tablet Take 10 mg by mouth at bedtime.   0  . levothyroxine (SYNTHROID, LEVOTHROID) 112 MCG tablet Take 224 mcg by mouth daily before breakfast.    . lisinopril (PRINIVIL,ZESTRIL) 5 MG tablet Take 5 mg by mouth daily.    . rosuvastatin (CRESTOR) 10 MG tablet Take 10 mg by mouth every Thursday.     . warfarin (COUMADIN) 1 MG tablet Take 1 mg by mouth See admin instructions. Take 1 mg by mouth on Tuesday and Thursday in addition to 10 mg tablet    . warfarin (COUMADIN) 10 MG tablet Take 10 mg by mouth at bedtime.      No current facility-administered medications for this visit.      Diagnostic Studies & Laboratory data:     Recent Radiology Findings:   CT ANGIO CHEST AORTA W/CM & OR WO/CM  Result Date: 08/01/2020 CLINICAL DATA:  Follow-up thoracic aortic aneurysm. Previous Bentall procedure. Creatinine was obtained on site at [facility] at [address] Results: Creatinine 1.0 mg/dL. EXAM: CT ANGIOGRAPHY CHEST WITH CONTRAST TECHNIQUE: Multidetector CT imaging of the chest was performed using the standard protocol during bolus administration of intravenous contrast. Multiplanar CT image reconstructions and MIPs were obtained to evaluate the vascular anatomy. CONTRAST:  42mL ISOVUE-370 IOPAMIDOL (ISOVUE-370) INJECTION 76% COMPARISON:  07/28/2018 FINDINGS: Cardiovascular: Expected postop changes from [facility] procedure are again seen with prosthetic aortic valve and ascending aortic graft. The graft  measures 4.0 cm in maximum diameter which is stable. No evidence of aortic dissection or pseudoaneurysm. Mediastinum/Nodes: No masses or pathologically enlarged lymph nodes identified. Lungs/Pleura: No pulmonary mass, infiltrate, or effusion. Upper abdomen: No acute findings. Musculoskeletal: No suspicious bone lesions identified. Review of the MIP images confirms the above findings. IMPRESSION: Stable postop changes from previous Bentall procedure. No evidence of recurrent thoracic aortic aneurysm or other significant abnormality. Electronically Signed   By: Marlaine Hind M.D.   On: 08/01/2020 10:27   I have independently reviewed the above radiology studies  and reviewed the findings with the patient.    Recent Lab Findings: Lab Results  Component Value Date   WBC 13.6 (H) 05/14/2018   HGB 13.0 05/14/2018   HCT 40.6 05/14/2018   PLT 225 05/14/2018   GLUCOSE 145 (H) 05/14/2018   ALT 18 08/10/2015  AST 25 08/10/2015   NA 137 05/14/2018   K 4.6 05/14/2018   CL 103 05/14/2018   CREATININE 0.87 05/14/2018   BUN 14 05/14/2018   CO2 23 05/14/2018   INR 1.20 05/15/2018   HGBA1C 5.5 08/01/2015      Assessment / Plan:      Patient had follow-up CT scan now approximately almost exactly 5 years years after replacement of his Acending aorta for dilated aorta previously he had had replacement of a bicuspid aortic valve with mechanical aortic valve.  A CT scan is stable.  Patient is following good practices as far as his mechanical aortic valve.  I explained to the patient that I was retiring later this month.  He is very pleased with his current cardiology follow-up, as such I have not made him a follow-up surgical appointment.  We did discuss that perhaps 2 to 3-year intervals a follow-up CTA of the chest should be considered.  He will discuss this with Dr. Elonda Husky.  Patient and his wife confirm they are well educated  In  all the warnings concerning his Coumadin, dental prophylaxis  [name] MD      [address] [place], [address] Office [phone]   Beeper [phone]  08/01/2020 11:38 AM

## 2023-03-01 ENCOUNTER — Ambulatory Visit: Payer: BC Managed Care – PPO | Admitting: Physician Assistant
